# Patient Record
Sex: Female | Born: 1975 | Race: Black or African American | Hispanic: No | Smoking: Former smoker
Health system: Southern US, Community
[De-identification: ages and names within clinical notes are randomized; demographics above are authoritative.]

## PROBLEM LIST (undated history)

## (undated) ENCOUNTER — Emergency Department (HOSPITAL_COMMUNITY): Admission: EM | Payer: BLUE CROSS/BLUE SHIELD | Source: Home / Self Care

## (undated) DIAGNOSIS — I82409 Acute embolism and thrombosis of unspecified deep veins of unspecified lower extremity: Secondary | ICD-10-CM

## (undated) DIAGNOSIS — J329 Chronic sinusitis, unspecified: Secondary | ICD-10-CM

## (undated) DIAGNOSIS — R112 Nausea with vomiting, unspecified: Secondary | ICD-10-CM

## (undated) DIAGNOSIS — Z9889 Other specified postprocedural states: Secondary | ICD-10-CM

## (undated) DIAGNOSIS — Z95828 Presence of other vascular implants and grafts: Secondary | ICD-10-CM

## (undated) DIAGNOSIS — J45909 Unspecified asthma, uncomplicated: Secondary | ICD-10-CM

## (undated) DIAGNOSIS — J039 Acute tonsillitis, unspecified: Secondary | ICD-10-CM

## (undated) DIAGNOSIS — D219 Benign neoplasm of connective and other soft tissue, unspecified: Secondary | ICD-10-CM

## (undated) HISTORY — PX: BREAST SURGERY: SHX581

## (undated) HISTORY — PX: TUBAL LIGATION: SHX77

## (undated) HISTORY — PX: ABDOMINAL HYSTERECTOMY: SHX81

---

## 1997-08-04 ENCOUNTER — Emergency Department (HOSPITAL_COMMUNITY): Admission: EM | Admit: 1997-08-04 | Discharge: 1997-08-04 | Payer: Self-pay | Admitting: Internal Medicine

## 1997-11-23 ENCOUNTER — Inpatient Hospital Stay (HOSPITAL_COMMUNITY): Admission: AD | Admit: 1997-11-23 | Discharge: 1997-11-23 | Payer: Self-pay | Admitting: *Deleted

## 1997-11-29 ENCOUNTER — Ambulatory Visit (HOSPITAL_COMMUNITY): Admission: RE | Admit: 1997-11-29 | Discharge: 1997-11-29 | Payer: Self-pay | Admitting: Obstetrics

## 1997-12-22 ENCOUNTER — Ambulatory Visit (HOSPITAL_COMMUNITY): Admission: RE | Admit: 1997-12-22 | Discharge: 1997-12-22 | Payer: Self-pay | Admitting: *Deleted

## 1998-02-04 ENCOUNTER — Inpatient Hospital Stay (HOSPITAL_COMMUNITY): Admission: AD | Admit: 1998-02-04 | Discharge: 1998-02-04 | Payer: Self-pay | Admitting: Obstetrics & Gynecology

## 1998-03-23 ENCOUNTER — Encounter: Payer: Self-pay | Admitting: Emergency Medicine

## 1998-03-23 ENCOUNTER — Emergency Department (HOSPITAL_COMMUNITY): Admission: EM | Admit: 1998-03-23 | Discharge: 1998-03-23 | Payer: Self-pay | Admitting: Emergency Medicine

## 1998-03-30 ENCOUNTER — Other Ambulatory Visit: Admission: RE | Admit: 1998-03-30 | Discharge: 1998-03-30 | Payer: Self-pay | Admitting: Obstetrics

## 1998-06-07 ENCOUNTER — Inpatient Hospital Stay (HOSPITAL_COMMUNITY): Admission: AD | Admit: 1998-06-07 | Discharge: 1998-06-07 | Payer: Self-pay | Admitting: *Deleted

## 1998-06-07 ENCOUNTER — Observation Stay (HOSPITAL_COMMUNITY): Admission: AD | Admit: 1998-06-07 | Discharge: 1998-06-07 | Payer: Self-pay | Admitting: Obstetrics & Gynecology

## 1998-06-14 ENCOUNTER — Encounter (HOSPITAL_COMMUNITY): Admission: RE | Admit: 1998-06-14 | Discharge: 1998-06-18 | Payer: Self-pay | Admitting: Obstetrics & Gynecology

## 1998-06-15 ENCOUNTER — Inpatient Hospital Stay (HOSPITAL_COMMUNITY): Admission: AD | Admit: 1998-06-15 | Discharge: 1998-06-17 | Payer: Self-pay | Admitting: Obstetrics & Gynecology

## 1999-01-24 ENCOUNTER — Ambulatory Visit (HOSPITAL_COMMUNITY): Admission: RE | Admit: 1999-01-24 | Discharge: 1999-01-24 | Payer: Self-pay | Admitting: General Surgery

## 1999-02-25 ENCOUNTER — Other Ambulatory Visit: Admission: RE | Admit: 1999-02-25 | Discharge: 1999-02-25 | Payer: Self-pay | Admitting: Obstetrics

## 1999-02-25 ENCOUNTER — Encounter (INDEPENDENT_AMBULATORY_CARE_PROVIDER_SITE_OTHER): Payer: Self-pay

## 2000-03-27 ENCOUNTER — Other Ambulatory Visit: Admission: RE | Admit: 2000-03-27 | Discharge: 2000-03-27 | Payer: Self-pay | Admitting: Obstetrics

## 2001-07-23 ENCOUNTER — Emergency Department (HOSPITAL_COMMUNITY): Admission: EM | Admit: 2001-07-23 | Discharge: 2001-07-24 | Payer: Self-pay | Admitting: Emergency Medicine

## 2001-07-24 ENCOUNTER — Encounter: Payer: Self-pay | Admitting: Emergency Medicine

## 2004-03-29 ENCOUNTER — Inpatient Hospital Stay (HOSPITAL_COMMUNITY): Admission: AD | Admit: 2004-03-29 | Discharge: 2004-03-30 | Payer: Self-pay | Admitting: Obstetrics & Gynecology

## 2004-03-30 ENCOUNTER — Inpatient Hospital Stay (HOSPITAL_COMMUNITY): Admission: AD | Admit: 2004-03-30 | Discharge: 2004-04-02 | Payer: Self-pay | Admitting: Obstetrics and Gynecology

## 2004-03-30 ENCOUNTER — Encounter (INDEPENDENT_AMBULATORY_CARE_PROVIDER_SITE_OTHER): Payer: Self-pay | Admitting: *Deleted

## 2004-03-30 ENCOUNTER — Ambulatory Visit: Payer: Self-pay | Admitting: Obstetrics and Gynecology

## 2005-01-24 ENCOUNTER — Emergency Department (HOSPITAL_COMMUNITY): Admission: EM | Admit: 2005-01-24 | Discharge: 2005-01-24 | Payer: Self-pay | Admitting: Family Medicine

## 2005-04-06 ENCOUNTER — Inpatient Hospital Stay (HOSPITAL_COMMUNITY): Admission: AD | Admit: 2005-04-06 | Discharge: 2005-04-06 | Payer: Self-pay | Admitting: Obstetrics

## 2005-11-05 ENCOUNTER — Inpatient Hospital Stay (HOSPITAL_COMMUNITY): Admission: AD | Admit: 2005-11-05 | Discharge: 2005-11-05 | Payer: Self-pay | Admitting: Obstetrics and Gynecology

## 2007-01-18 ENCOUNTER — Inpatient Hospital Stay (HOSPITAL_COMMUNITY): Admission: AD | Admit: 2007-01-18 | Discharge: 2007-01-18 | Payer: Self-pay | Admitting: Obstetrics

## 2007-02-04 ENCOUNTER — Ambulatory Visit: Payer: Self-pay | Admitting: Internal Medicine

## 2007-02-04 DIAGNOSIS — J45909 Unspecified asthma, uncomplicated: Secondary | ICD-10-CM | POA: Insufficient documentation

## 2007-02-04 DIAGNOSIS — K219 Gastro-esophageal reflux disease without esophagitis: Secondary | ICD-10-CM | POA: Insufficient documentation

## 2007-02-16 ENCOUNTER — Ambulatory Visit (HOSPITAL_COMMUNITY): Admission: RE | Admit: 2007-02-16 | Discharge: 2007-02-16 | Payer: Self-pay | Admitting: Obstetrics & Gynecology

## 2007-03-14 ENCOUNTER — Inpatient Hospital Stay (HOSPITAL_COMMUNITY): Admission: AD | Admit: 2007-03-14 | Discharge: 2007-03-14 | Payer: Self-pay | Admitting: Obstetrics

## 2007-03-16 ENCOUNTER — Inpatient Hospital Stay (HOSPITAL_COMMUNITY): Admission: AD | Admit: 2007-03-16 | Discharge: 2007-03-17 | Payer: Self-pay | Admitting: Obstetrics

## 2007-03-25 ENCOUNTER — Inpatient Hospital Stay (HOSPITAL_COMMUNITY): Admission: RE | Admit: 2007-03-25 | Discharge: 2007-03-28 | Payer: Self-pay | Admitting: Obstetrics & Gynecology

## 2007-09-16 ENCOUNTER — Ambulatory Visit (HOSPITAL_COMMUNITY): Admission: RE | Admit: 2007-09-16 | Discharge: 2007-09-16 | Payer: Self-pay | Admitting: Obstetrics & Gynecology

## 2009-02-19 ENCOUNTER — Emergency Department (HOSPITAL_COMMUNITY): Admission: EM | Admit: 2009-02-19 | Discharge: 2009-02-19 | Payer: Self-pay | Admitting: Emergency Medicine

## 2010-04-28 ENCOUNTER — Emergency Department (HOSPITAL_COMMUNITY): Payer: Medicaid Other

## 2010-04-28 ENCOUNTER — Emergency Department (HOSPITAL_COMMUNITY)
Admission: EM | Admit: 2010-04-28 | Discharge: 2010-04-28 | Disposition: A | Discharge status: Home / Self Care | Payer: Medicaid Other | Attending: Emergency Medicine | Admitting: Emergency Medicine

## 2010-04-28 DIAGNOSIS — X500XXA Overexertion from strenuous movement or load, initial encounter: Secondary | ICD-10-CM | POA: Insufficient documentation

## 2010-04-28 DIAGNOSIS — M25476 Effusion, unspecified foot: Secondary | ICD-10-CM | POA: Insufficient documentation

## 2010-04-28 DIAGNOSIS — M25473 Effusion, unspecified ankle: Secondary | ICD-10-CM | POA: Insufficient documentation

## 2010-04-28 DIAGNOSIS — M25579 Pain in unspecified ankle and joints of unspecified foot: Secondary | ICD-10-CM | POA: Insufficient documentation

## 2010-04-28 DIAGNOSIS — Y9229 Other specified public building as the place of occurrence of the external cause: Secondary | ICD-10-CM | POA: Insufficient documentation

## 2010-04-28 DIAGNOSIS — S93409A Sprain of unspecified ligament of unspecified ankle, initial encounter: Secondary | ICD-10-CM | POA: Insufficient documentation

## 2010-04-28 DIAGNOSIS — J45909 Unspecified asthma, uncomplicated: Secondary | ICD-10-CM | POA: Insufficient documentation

## 2010-05-28 NOTE — H&P (Signed)
NAMESHAREE, Linda Dalton            ACCOUNT NO.:  [id number]   MEDICAL RECORD NO.:  [id number]          PATIENT TYPE:  INP   LOCATION:  9168                          [facility]ILITY:  [facility]   PHYSICIAN:  Roseanna Rainbow, M.D.DATE OF BIRTH:  1975-06-20   DATE OF ADMISSION:  03/16/2007  DATE OF DISCHARGE:                              HISTORY & PHYSICAL   CHIEF COMPLAINT:  The patient is a 35 year old para 3 with an estimated  date of confinement of March 11 with an intrauterine pregnancy at 30-6/7  weeks for induction of labor.   HISTORY OF PRESENT ILLNESS:  Please see the above.   ALLERGIES:  BIAXIN and CODEINE.   MEDICATIONS:  Valtrex and prenatal vitamins.   OB RISK FACTORS:  History of LGA, history of a preterm delivery, and  history of genital herpes.   PRENATAL LABORATORY DATA:  Chlamydia probe negative.  Urine culture and  sensitivity insignificant growth.  Pap smear negative. GC probe  negative.  One-hour GTT 87.  Hepatitis B surface antigen negative.  Hematocrit 35.6, hemoglobin 11.9. HIV nonreactive. HSV-1 and 2 titers  7.26 and 5.17, respectively. Platelets 225,000.  Blood type is AB  positive.  Antibody screen negative, RPR nonreactive.  Rubella is  immune.  Sickle cell negative.  Varicella immune.  Ultrasound performed  on January 23 at 33 weeks 2 days:  Amniotic fluid high-normal is 20.7,  no previa estimated. Fetal weight percentile was the 51st percentile.  GBS is negative on February 9.   PAST OBSTETRICAL HISTORY:  In May 1994, she was delivered of a live born  female full term, 7 pounds 6 ounces, vaginal delivery. In March 1998, she  was delivered of a live born female 7 pounds 14 ounces, full term vaginal  delivery. In June 2000, she was delivered of a live born female 9 pounds 2  ounces at post dates, vaginal delivery. In March 2006, she had PPROM at  19 weeks and a spontaneous abortion.   PAST MEDICAL HISTORY:  Asthma.   PAST SURGICAL HISTORY:  Lumpectomy for a  benign breast mass.   SOCIAL HISTORY:  Unemployed, single. Does not give any significant  history of alcohol usage.  Denies illicit drug use.   FAMILY HISTORY:  Asthma, heart disease, hypertension.   PHYSICAL EXAMINATION:  VITAL SIGNS:  Stable, afebrile.  Fetal heart  tracing reassuring.  PELVIC:  On sterile vaginal exam, the cervix is fingertip and thick.   ASSESSMENT:  1. Multipara at term for elective induction of labor.  2. Unfavorable Bishop score.  3. Fetal heart tracing consistent with fetal well being.   PLAN:  Admission. Two-stage induction of labor. Will start with ripening  with Cervidil.      Roseanna Rainbow, M.D.  Electronically Signed     LAJ/MEDQ  D:  03/17/2007  T:  03/17/2007  Job:  [id number]

## 2010-05-28 NOTE — Op Note (Signed)
Linda Dalton, Linda Dalton            ACCOUNT NO.:  [id number]   MEDICAL RECORD NO.:  [id number]          PATIENT TYPE:  AMB   LOCATION:  SDC                           [facility]   PHYSICIAN:  Roseanna Rainbow, M.D.DATE OF BIRTH:  1975-03-29   DATE OF PROCEDURE:  09/16/2007  DATE OF DISCHARGE:                               OPERATIVE REPORT   PREOPERATIVE DIAGNOSIS:  Multiparity, desires a sterilization procedure.   POSTOPERATIVE DIAGNOSIS:  Multiparity, desires a sterilization  procedure.   PROCEDURE:  Essure procedure.   SURGEON:  Roseanna Rainbow, MD   ANESTHESIA:  Managed anesthesia care, paracervical block.   ESTIMATED BLOOD LOSS:  Minimal.   COMPLICATIONS:  None.   PROCEDURE:  The patient was taken to the operating room with an IV  running.  She was placed in the dorsal lithotomy position and prepped  and draped in the usual sterile fashion.  After a time-out had been  completed, a sterile speculum was placed in the patient's vagina.  The  anterior lip of the cervix was then infiltrated with 2 mL of 1%  lidocaine.  The single-tooth tenaculum was then applied to this  location.  A 4 mL of 1% lidocaine were then injected at 4 and 7 o'clock  to produce a paracervical block.  The cervix was then dilated with [facility]  dilators.  The tubal ostia were visualized.  Of note, it was felt that  the right ostium would be more difficult to cannulate and the decision  was made to proceed with the side first.  There was significant amount  of tubal spasm bilaterally.  The Essure was then placed on the right.  The device was placed distally and it was difficult to visualize the  coils on the right.  The procedure was repeated on the left side.  The  coils were noted at the external os.  The coils were flushed with the  external os.  All instruments were then removed.  The single-tooth  tenaculum was removed with minimal bleeding noted from the cervix.  At  the close of the  procedure, the instrument and pack counts were said to  be correct x2.  The patient was taken to the PACU awake and in stable  condition.      Roseanna Rainbow, M.D.  Electronically Signed     LAJ/MEDQ  D:  09/16/2007  T:  09/17/2007  Job:  [id number]

## 2010-05-31 NOTE — Discharge Summary (Signed)
Linda Dalton, Linda Dalton    ACCOUNT NO.:  [id number]   MEDICAL RECORD NO.:  [id number]          PATIENT TYPE:  INP   LOCATION:  9310                          [facility]   PHYSICIAN:  Linda Dalton, M.D.DATE OF BIRTH:  [birth year]   DATE OF ADMISSION:  03/30/2004  DATE OF DISCHARGE:                                 DISCHARGE SUMMARY   ADMISSION DIAGNOSIS:  A 35 year old at 47 weeks with preterm premature  rupture of membranes.   DISCHARGE DIAGNOSIS:  Status post delivery of induced abortion at 17 weeks.   DISCHARGE MEDICATIONS:  1.  Ibuprofen 600 mg one tablet p.o. q.8h. p.r.n. pain #20, refills none.  2.  Depo-Provera 150 mg IM x1 given at [facility] before discharge.   HISTORY OF PRESENT ILLNESS:  The patient is a 35 year old G4 P3-0-1-3. She  was admitted at 17 weeks with PPROM and a low AFI. She had had prenatal care  in [place] but was in [place] due to the death of her mother. The  patient was initially admitted to [facility] but the patient was temporarily  discharged so she could attend the mother's funeral. She had received one  dose of Cytotec before leaving for the funeral. She returned after the  funeral and received her second dose of Cytotec. From that point on, the  plan was for Cytotec q.12h. until delivery with Unasyn 3 g IV q.6h. This  admission was completed by Dr. Vincente Dalton.   Transfer of care was completed on April 01, 2004 as the patient was  transferred to the teaching service with Dr. Gavin Dalton attending. The patient  was dosed with Cytotec 600 mg per vagina q.4h. The patient delivered at 2015  on April 01, 2004 of a nonviable fetus with Apgars of 0 at one minute and 0  at five minutes. Placenta delivered at 2225 on the same day. The patient had  adequate hemostasis after delivery. While being induced, the patient did not  have any fevers.   On discharge April 02, 2004 the patient was afebrile with stable vital  signs. She was coping well  with the loss. She had a firm fundus and  decreased bleeding. Her pain was controlled. She received Depo-Provera  before discharge.   [facility] [place] was contacted. The patient has an  appointment on April 05, 2004 at 1 p.m.   DISCHARGE INSTRUCTIONS:  Per routine for nonviable fetus.      GSD/MEDQ  D:  04/02/2004  T:  04/02/2004  Job:  [id number]   cc:   Linda Dalton, M.D.  [address], [address]  [place] [address]  Fax: [phone]   Linda Dalton, M.D.  [facility]  [address] [place], [place] [address]

## 2010-10-07 LAB — CBC
HCT: 34.6 — ABNORMAL LOW
HCT: 34.8 — ABNORMAL LOW
Hemoglobin: 11.8 — ABNORMAL LOW
Hemoglobin: 11.9 — ABNORMAL LOW
Hemoglobin: 12.1
MCHC: 34.6
MCV: 89.2
RBC: 3.82 — ABNORMAL LOW
RBC: 3.87
RBC: 3.9
RDW: 14.2
RDW: 14.3
WBC: 12.9 — ABNORMAL HIGH

## 2010-10-07 LAB — RAPID URINE DRUG SCREEN, HOSP PERFORMED

## 2010-10-07 LAB — RPR: RPR Ser Ql: NONREACTIVE

## 2010-10-16 LAB — CBC
HCT: 44.7
Hemoglobin: 14.9
MCHC: 33.4
MCV: 92.1
RBC: 4.85
WBC: 4.1

## 2012-03-21 ENCOUNTER — Emergency Department (INDEPENDENT_AMBULATORY_CARE_PROVIDER_SITE_OTHER)
Admission: EM | Admit: 2012-03-21 | Discharge: 2012-03-21 | Disposition: A | Discharge status: Home / Self Care | Payer: No Typology Code available for payment source | Source: Home / Self Care

## 2012-03-21 ENCOUNTER — Encounter (HOSPITAL_COMMUNITY): Payer: Self-pay | Admitting: Emergency Medicine

## 2012-03-21 DIAGNOSIS — J329 Chronic sinusitis, unspecified: Secondary | ICD-10-CM

## 2012-03-21 DIAGNOSIS — A084 Viral intestinal infection, unspecified: Secondary | ICD-10-CM

## 2012-03-21 DIAGNOSIS — A088 Other specified intestinal infections: Secondary | ICD-10-CM

## 2012-03-21 HISTORY — DX: Unspecified asthma, uncomplicated: J45.909

## 2012-03-21 MED ORDER — OXYMETAZOLINE HCL 0.05 % NA SOLN: 2.0000 | Freq: Two times a day (BID) | NASAL | Status: DC

## 2012-03-21 MED ORDER — FLUTICASONE PROPIONATE 50 MCG/ACT NA SUSP: 2.0000 | Freq: Every day | NASAL | Status: DC

## 2012-03-21 MED ORDER — MECLIZINE HCL 12.5 MG PO TABS: 12.5000 mg | ORAL_TABLET | Freq: Three times a day (TID) | ORAL | Status: DC | PRN

## 2012-03-21 MED ORDER — ONDANSETRON HCL 4 MG PO TABS: 4.0000 mg | ORAL_TABLET | Freq: Three times a day (TID) | ORAL | Status: DC | PRN

## 2012-03-21 NOTE — ED Notes (Signed)
Patient reports dizziness and headache and vomiting, onset this am.  Reports feeling fine yesterday

## 2012-03-21 NOTE — ED Provider Notes (Addendum)
History     CSN: [id number]  Arrival date & time 03/21/12  1322   First MD Initiated Contact with Patient 03/21/12 1344      Chief Complaint  Patient presents with  . Emesis    (Consider location/radiation/quality/duration/timing/severity/associated sxs/prior treatment) HPI This with a complaint of vomiting and dizziness. The patient woke up at 2:00 in the morning with nausea and when she stood up to go to the bathroom she noted she was quite dizzy. She vomited once and has since been taking only ice chips and has not vomited again. However she continues to be quite dizzy. She admits to having sinus pressure and popping in her ears since yesterday. She has been urinating well and has not noticed a decreased urine output or dark urine. She states she had rods put in her fallopian tubes to "close them up". She states it is not an IUD and she doubts that she is pregnant. Past Medical History  Diagnosis Date  . Asthma     Past Surgical History  Procedure Laterality Date  . Breast surgery      No family history on file.  History  Substance Use Topics  . Smoking status: Current Every Day Smoker  . Smokeless tobacco: Not on file  . Alcohol Use: No    OB History   Grav Para Term Preterm Abortions TAB SAB Ect Mult Living                  Review of Systems  Constitutional: Positive for appetite change and fatigue. Negative for fever, chills, diaphoresis and unexpected weight change.  HENT: Positive for rhinorrhea, postnasal drip and sinus pressure. Negative for ear pain and sneezing.   Gastrointestinal: Positive for nausea and vomiting. Negative for abdominal pain, diarrhea and constipation.  Genitourinary: Negative.   Musculoskeletal: Negative.   Skin: Negative.   Neurological: Negative.   Psychiatric/Behavioral: Negative.     Allergies  Codeine  Home Medications   Current Outpatient Rx  Name  Route  Sig  Dispense  Refill  . dimenhyDRINATE (DRAMAMINE) 50 MG tablet  Oral   Take 50 mg by mouth every 8 (eight) hours as needed.         . fluticasone (FLONASE) 50 MCG/ACT nasal spray   Nasal   Place 2 sprays into the nose daily.   16 g   2   . meclizine (ANTIVERT) 12.5 MG tablet   Oral   Take 1-2 tablets (12.5-25 mg total) by mouth 3 (three) times daily as needed for dizziness.   30 tablet   0   . ondansetron (ZOFRAN) 4 MG tablet   Oral   Take 1 tablet (4 mg total) by mouth every 8 (eight) hours as needed for nausea.   20 tablet   0   . oxymetazoline (AFRIN NASAL SPRAY) 0.05 % nasal spray   Nasal   Place 2 sprays into the nose 2 (two) times daily.   30 mL   0     BP 126/81  Pulse 90  Temp(Src) 99 F (37.2 C) (Oral)  SpO2 99%  LMP 01/28/2012  Physical Exam  ED Course  Procedures (including critical care time)  Labs Reviewed - No data to display No results found.   1. Sinus infection   2. Viral gastroenteritis       MDM  Likely sinus blockage is resulting in her symptoms of dizziness. Flonase, Afrin, meclizine prescribed. In addition Zofran prescribed for possible viral gastroenteritis. He is advised  to drink plenty of liquids to stay hydrated and transition from laying to standing position slowly.       Calvert Cantor, MD 03/21/12 1610  Calvert Cantor, MD 03/21/12 1622

## 2012-07-22 ENCOUNTER — Emergency Department (HOSPITAL_COMMUNITY)
Admission: EM | Admit: 2012-07-22 | Discharge: 2012-07-22 | Disposition: A | Discharge status: Home / Self Care | Payer: No Typology Code available for payment source | Attending: Emergency Medicine | Admitting: Emergency Medicine

## 2012-07-22 ENCOUNTER — Encounter (HOSPITAL_COMMUNITY): Payer: Self-pay

## 2012-07-22 DIAGNOSIS — J45909 Unspecified asthma, uncomplicated: Secondary | ICD-10-CM | POA: Insufficient documentation

## 2012-07-22 DIAGNOSIS — M549 Dorsalgia, unspecified: Secondary | ICD-10-CM | POA: Insufficient documentation

## 2012-07-22 DIAGNOSIS — R52 Pain, unspecified: Secondary | ICD-10-CM | POA: Insufficient documentation

## 2012-07-22 DIAGNOSIS — N760 Acute vaginitis: Secondary | ICD-10-CM | POA: Insufficient documentation

## 2012-07-22 DIAGNOSIS — R509 Fever, unspecified: Secondary | ICD-10-CM | POA: Insufficient documentation

## 2012-07-22 DIAGNOSIS — R5381 Other malaise: Secondary | ICD-10-CM | POA: Insufficient documentation

## 2012-07-22 DIAGNOSIS — B9689 Other specified bacterial agents as the cause of diseases classified elsewhere: Secondary | ICD-10-CM | POA: Insufficient documentation

## 2012-07-22 DIAGNOSIS — R32 Unspecified urinary incontinence: Secondary | ICD-10-CM | POA: Insufficient documentation

## 2012-07-22 DIAGNOSIS — N949 Unspecified condition associated with female genital organs and menstrual cycle: Secondary | ICD-10-CM | POA: Insufficient documentation

## 2012-07-22 DIAGNOSIS — R112 Nausea with vomiting, unspecified: Secondary | ICD-10-CM | POA: Insufficient documentation

## 2012-07-22 DIAGNOSIS — R5383 Other fatigue: Secondary | ICD-10-CM | POA: Insufficient documentation

## 2012-07-22 DIAGNOSIS — F17200 Nicotine dependence, unspecified, uncomplicated: Secondary | ICD-10-CM | POA: Insufficient documentation

## 2012-07-22 DIAGNOSIS — A499 Bacterial infection, unspecified: Secondary | ICD-10-CM | POA: Insufficient documentation

## 2012-07-22 DIAGNOSIS — N12 Tubulo-interstitial nephritis, not specified as acute or chronic: Secondary | ICD-10-CM

## 2012-07-22 DIAGNOSIS — R42 Dizziness and giddiness: Secondary | ICD-10-CM | POA: Insufficient documentation

## 2012-07-22 DIAGNOSIS — Z8742 Personal history of other diseases of the female genital tract: Secondary | ICD-10-CM | POA: Insufficient documentation

## 2012-07-22 HISTORY — DX: Benign neoplasm of connective and other soft tissue, unspecified: D21.9

## 2012-07-22 LAB — URINE MICROSCOPIC-ADD ON

## 2012-07-22 LAB — COMPREHENSIVE METABOLIC PANEL
AST: 29 U/L (ref 0–37)
BUN: 12 mg/dL (ref 6–23)
CO2: 24 mEq/L (ref 19–32)
Calcium: 9.4 mg/dL (ref 8.4–10.5)
Chloride: 98 mEq/L (ref 96–112)
Creatinine, Ser: 0.86 mg/dL (ref 0.50–1.10)
GFR calc non Af Amer: 87 mL/min — ABNORMAL LOW (ref >90)
Total Bilirubin: 1 mg/dL (ref 0.3–1.2)

## 2012-07-22 LAB — CBC WITH DIFFERENTIAL/PLATELET
Basophils Absolute: 0 10*3/uL (ref 0.0–0.1)
Basophils Relative: 0 % (ref 0–1)
Eosinophils Relative: 0 % (ref 0–5)
HCT: 33.3 % — ABNORMAL LOW (ref 36.0–46.0)
Hemoglobin: 10.6 g/dL — ABNORMAL LOW (ref 12.0–15.0)
Lymphocytes Relative: 11 % — ABNORMAL LOW (ref 12–46)
MCHC: 31.8 g/dL (ref 30.0–36.0)
MCV: 78.9 fL (ref 78.0–100.0)
Monocytes Absolute: 0.6 10*3/uL (ref 0.1–1.0)
Monocytes Relative: 6 % (ref 3–12)
Neutro Abs: 7.4 10*3/uL (ref 1.7–7.7)
RDW: 15.8 % — ABNORMAL HIGH (ref 11.5–15.5)

## 2012-07-22 LAB — WET PREP, GENITAL
Trich, Wet Prep: NONE SEEN
Yeast Wet Prep HPF POC: NONE SEEN

## 2012-07-22 LAB — URINALYSIS, ROUTINE W REFLEX MICROSCOPIC
Nitrite: NEGATIVE
Protein, ur: 100 mg/dL — AB
Specific Gravity, Urine: 1.025 (ref 1.005–1.030)
Urobilinogen, UA: 0.2 mg/dL (ref 0.0–1.0)

## 2012-07-22 LAB — PREGNANCY, URINE: Preg Test, Ur: NEGATIVE

## 2012-07-22 LAB — LIPASE, BLOOD: Lipase: 14 U/L (ref 11–59)

## 2012-07-22 MED ORDER — ONDANSETRON 8 MG PO TBDP: 8.0000 mg | ORAL_TABLET | Freq: Three times a day (TID) | ORAL | Status: DC | PRN

## 2012-07-22 MED ORDER — SODIUM CHLORIDE 0.9 % IV BOLUS (SEPSIS)
1000.0000 mL | Freq: Once | INTRAVENOUS | Status: AC
  Administered 2012-07-22: 1000 mL via INTRAVENOUS

## 2012-07-22 MED ORDER — ONDANSETRON HCL 4 MG/2ML IJ SOLN
4.0000 mg | Freq: Once | INTRAMUSCULAR | Status: AC
  Administered 2012-07-22: 4 mg via INTRAVENOUS
  Filled 2012-07-22: qty 2

## 2012-07-22 MED ORDER — MORPHINE SULFATE 4 MG/ML IJ SOLN
4.0000 mg | Freq: Once | INTRAMUSCULAR | Status: AC
  Administered 2012-07-22: 4 mg via INTRAVENOUS
  Filled 2012-07-22: qty 1

## 2012-07-22 MED ORDER — HYDROCODONE-ACETAMINOPHEN 5-325 MG PO TABS: 1.0000 | ORAL_TABLET | Freq: Four times a day (QID) | ORAL | Status: DC | PRN

## 2012-07-22 MED ORDER — CEPHALEXIN 500 MG PO CAPS: 500.0000 mg | ORAL_CAPSULE | Freq: Four times a day (QID) | ORAL | Status: DC

## 2012-07-22 MED ORDER — METRONIDAZOLE 500 MG PO TABS: 500.0000 mg | ORAL_TABLET | Freq: Two times a day (BID) | ORAL | Status: DC

## 2012-07-22 MED ORDER — ACETAMINOPHEN 325 MG PO TABS
650.0000 mg | ORAL_TABLET | Freq: Once | ORAL | Status: AC
  Administered 2012-07-22: 650 mg via ORAL
  Filled 2012-07-22: qty 2

## 2012-07-22 MED ORDER — SODIUM CHLORIDE 0.9 % IV BOLUS (SEPSIS): 1000.0000 mL | Freq: Once | INTRAVENOUS | Status: DC

## 2012-07-22 MED ORDER — DEXTROSE 5 % IV SOLN
1.0000 g | Freq: Once | INTRAVENOUS | Status: AC
  Administered 2012-07-22: 1 g via INTRAVENOUS
  Filled 2012-07-22: qty 10

## 2012-07-22 NOTE — ED Notes (Signed)
gingerale given

## 2012-07-22 NOTE — ED Provider Notes (Signed)
History    CSN: [id number] Arrival date & time 07/22/12  1214  First MD Initiated Contact with Patient 07/22/12 1235     No chief complaint on file.  (Consider location/radiation/quality/duration/timing/severity/associated sxs/prior Treatment) HPI KACI DILLIE is a 37 y.o. female who presents to ED with complaint of body aches, back pain, abdominal pain, chills onset yesterday. States symptoms began with back ache and chills last night. States took advil this morning with no improvement. States pain radiates from the back into the lower abdomen. Pt admits to dizziness, nausea, vomiting. Pt denies any dysuria, but reports two episodes of urinary incontinence today. Pt denies any vaginal bleeding or discharge. No headache, neck pain or stiffness, no cough or URI symptoms.    Past Medical History  Diagnosis Date  . Asthma   . Fibroid tumor    Past Surgical History  Procedure Laterality Date  . Breast surgery     History reviewed. No pertinent family history. History  Substance Use Topics  . Smoking status: Current Every Day Smoker  . Smokeless tobacco: Not on file  . Alcohol Use: No   OB History   Grav Para Term Preterm Abortions TAB SAB Ect Mult Living                 Review of Systems  Constitutional: Positive for fever, chills and fatigue.  HENT: Negative for ear pain, congestion, sore throat, mouth sores, neck pain and neck stiffness.   Respiratory: Negative.  Negative for cough and shortness of breath.   Cardiovascular: Negative.   Gastrointestinal: Positive for nausea, vomiting and abdominal pain. Negative for diarrhea, constipation and blood in stool.  Genitourinary: Positive for flank pain and pelvic pain. Negative for dysuria, urgency, frequency, hematuria, vaginal bleeding, vaginal discharge and vaginal pain.  Musculoskeletal: Positive for back pain.  Skin: Negative.   Neurological: Positive for dizziness, weakness and light-headedness. Negative for  headaches.    Allergies  Codeine  Home Medications   Current Outpatient Rx  Name  Route  Sig  Dispense  Refill  . ondansetron (ZOFRAN) 4 MG tablet   Oral   Take 1 tablet (4 mg total) by mouth every 8 (eight) hours as needed for nausea.   20 tablet   0    BP 128/72  Pulse 112  Temp(Src) 102.8 F (39.3 C) (Oral)  Resp 22  SpO2 98%  LMP 07/08/2012 Physical Exam  Nursing note and vitals reviewed. Constitutional: She is oriented to person, place, and time. She appears well-developed and well-nourished. No distress.  Eyes: Conjunctivae are normal.  Neck: Normal range of motion. Neck supple.  Cardiovascular: Normal rate, regular rhythm and normal heart sounds.   Pulmonary/Chest: Effort normal and breath sounds normal. No respiratory distress. She has no wheezes. She has no rales.  Abdominal: Soft. Bowel sounds are normal. She exhibits no distension. There is tenderness. There is no rebound and no guarding.  Suprapubic tenderness. Bilateral CVA tenderness  Genitourinary:  Normal external genitalia. Yellow thin vginal discharge. No CMT. Uterus enlarge, possible fibroid palpated. No adnexal tenderness  Musculoskeletal: She exhibits no edema.  Lymphadenopathy:    She has no cervical adenopathy.  Neurological: She is alert and oriented to person, place, and time.  Skin: Skin is warm and dry.    ED Course  Procedures (including critical care time)  Results for orders placed during the hospital encounter of 07/22/12  WET PREP, GENITAL      Result Value Range   Yeast Wet Prep  HPF POC NONE SEEN  NONE SEEN   Trich, Wet Prep NONE SEEN  NONE SEEN   Clue Cells Wet Prep HPF POC MODERATE (*) NONE SEEN   WBC, Wet Prep HPF POC FEW (*) NONE SEEN  CBC WITH DIFFERENTIAL      Result Value Range   WBC 9.0  4.0 - 10.5 K/uL   RBC 4.22  3.87 - 5.11 MIL/uL   Hemoglobin 10.6 (*) 12.0 - 15.0 g/dL   HCT 30.8 (*) 65.7 - 84.6 %   MCV 78.9  78.0 - 100.0 fL   MCH 25.1 (*) 26.0 - 34.0 pg   MCHC  31.8  30.0 - 36.0 g/dL   RDW 96.2 (*) 95.2 - 84.1 %   Platelets 309  150 - 400 K/uL   Neutrophils Relative % 83 (*) 43 - 77 %   Neutro Abs 7.4  1.7 - 7.7 K/uL   Lymphocytes Relative 11 (*) 12 - 46 %   Lymphs Abs 1.0  0.7 - 4.0 K/uL   Monocytes Relative 6  3 - 12 %   Monocytes Absolute 0.6  0.1 - 1.0 K/uL   Eosinophils Relative 0  0 - 5 %   Eosinophils Absolute 0.0  0.0 - 0.7 K/uL   Basophils Relative 0  0 - 1 %   Basophils Absolute 0.0  0.0 - 0.1 K/uL  COMPREHENSIVE METABOLIC PANEL      Result Value Range   Sodium 133 (*) 135 - 145 mEq/L   Potassium 3.8  3.5 - 5.1 mEq/L   Chloride 98  96 - 112 mEq/L   CO2 24  19 - 32 mEq/L   Glucose, Bld 105 (*) 70 - 99 mg/dL   BUN 12  6 - 23 mg/dL   Creatinine, Ser 3.24  0.50 - 1.10 mg/dL   Calcium 9.4  8.4 - 40.1 mg/dL   Total Protein 8.0  6.0 - 8.3 g/dL   Albumin 3.8  3.5 - 5.2 g/dL   AST 29  0 - 37 U/L   ALT 23  0 - 35 U/L   Alkaline Phosphatase 81  39 - 117 U/L   Total Bilirubin 1.0  0.3 - 1.2 mg/dL   GFR calc non Af Amer 87 (*) >90 mL/min   GFR calc Af Amer >90  >90 mL/min  URINALYSIS, ROUTINE W REFLEX MICROSCOPIC      Result Value Range   Color, Urine YELLOW  YELLOW   APPearance CLOUDY (*) CLEAR   Specific Gravity, Urine 1.025  1.005 - 1.030   pH 5.5  5.0 - 8.0   Glucose, UA NEGATIVE  NEGATIVE mg/dL   Hgb urine dipstick MODERATE (*) NEGATIVE   Bilirubin Urine NEGATIVE  NEGATIVE   Ketones, ur NEGATIVE  NEGATIVE mg/dL   Protein, ur 027 (*) NEGATIVE mg/dL   Urobilinogen, UA 0.2  0.0 - 1.0 mg/dL   Nitrite NEGATIVE  NEGATIVE   Leukocytes, UA LARGE (*) NEGATIVE  LIPASE, BLOOD      Result Value Range   Lipase 14  11 - 59 U/L  URINE MICROSCOPIC-ADD ON      Result Value Range   Squamous Epithelial / LPF FEW (*) RARE   WBC, UA TOO NUMEROUS TO COUNT  <3 WBC/hpf   RBC / HPF 0-2  <3 RBC/hpf   Bacteria, UA MANY (*) RARE    No results found.  1. Pyelonephritis   2. BV (bacterial vaginosis)     MDM  Pt with bilateral lower back  pain, abdominal pain, fever. UA infected. Pelvic exam unremarkable. Pt rehyadred. Tylenol given for fever. Rocephin for UTI. Question most likely pyelonephritis given fever, vomiting, CVA tenderness. No acute abdomen. Pt feeling better and tolerating PO fluids in ED. Will start on keflex for infection. Flagyl for BV. Pt comfortable going home. Provided pain and nausea medications.   Filed Vitals:   07/22/12 1515 07/22/12 1531 07/22/12 1600 07/22/12 1606  BP: 116/65 109/82 122/65   Pulse: 90 93 94   Temp:  100.7 F (38.2 C)  99 F (37.2 C)  TempSrc:  Oral  Oral  Resp:      SpO2: 97% 99% 99%      Lottie Mussel, PA-C 07/22/12 1921

## 2012-07-22 NOTE — ED Notes (Addendum)
Pt presents with onset of cold chills and shortness of breath that began last night.  Pt reports nausea and vomiting that began this morning, with bilateral lower abdominal pain.  Pt denies any vaginal discharge or dysuria; reports episode of urinary incontinence that occurred today. Pt reports h/o fibroid cysts with this pain much worse.  Pt unsure of sick contact.

## 2012-07-23 LAB — GC/CHLAMYDIA PROBE AMP
CT Probe RNA: NEGATIVE
GC Probe RNA: NEGATIVE

## 2012-07-24 LAB — URINE CULTURE

## 2012-07-24 NOTE — ED Provider Notes (Signed)
Medical screening examination/treatment/procedure(s) were performed by non-physician practitioner and as supervising physician I was immediately available for consultation/collaboration.   Hurman Horn, MD 07/24/12 2004

## 2012-07-25 ENCOUNTER — Telehealth (HOSPITAL_COMMUNITY): Payer: Self-pay | Admitting: Emergency Medicine

## 2012-07-25 NOTE — ED Notes (Signed)
Post ED Visit - Positive Culture Follow-up  Culture report reviewed by antimicrobial stewardship pharmacist: [x]  Wes Dulaney, Pharm.D., BCPS []  Celedonio Miyamoto, [address].D., BCPS []  Georgina Pillion, Pharm.D., BCPS []  [place], [address].D., BCPS, AAHIVP []  Estella Husk, Pharm.D., BCPS, AAHIVP  Positive urine culture Treated with Keflex, organism sensitive to the same and no further patient follow-up is required at this time.  Linda Dalton 07/25/2012, 5:46 PM

## 2012-08-11 ENCOUNTER — Emergency Department (HOSPITAL_COMMUNITY)
Admission: EM | Admit: 2012-08-11 | Discharge: 2012-08-11 | Disposition: A | Discharge status: Home / Self Care | Payer: Self-pay | Attending: Emergency Medicine | Admitting: Emergency Medicine

## 2012-08-11 ENCOUNTER — Encounter (HOSPITAL_COMMUNITY): Payer: Self-pay | Admitting: Emergency Medicine

## 2012-08-11 DIAGNOSIS — R1310 Dysphagia, unspecified: Secondary | ICD-10-CM | POA: Insufficient documentation

## 2012-08-11 DIAGNOSIS — J069 Acute upper respiratory infection, unspecified: Secondary | ICD-10-CM | POA: Insufficient documentation

## 2012-08-11 DIAGNOSIS — J029 Acute pharyngitis, unspecified: Secondary | ICD-10-CM | POA: Insufficient documentation

## 2012-08-11 DIAGNOSIS — Z8589 Personal history of malignant neoplasm of other organs and systems: Secondary | ICD-10-CM | POA: Insufficient documentation

## 2012-08-11 DIAGNOSIS — J45909 Unspecified asthma, uncomplicated: Secondary | ICD-10-CM | POA: Insufficient documentation

## 2012-08-11 DIAGNOSIS — H9209 Otalgia, unspecified ear: Secondary | ICD-10-CM | POA: Insufficient documentation

## 2012-08-11 DIAGNOSIS — IMO0002 Reserved for concepts with insufficient information to code with codable children: Secondary | ICD-10-CM | POA: Insufficient documentation

## 2012-08-11 DIAGNOSIS — R51 Headache: Secondary | ICD-10-CM | POA: Insufficient documentation

## 2012-08-11 DIAGNOSIS — F17200 Nicotine dependence, unspecified, uncomplicated: Secondary | ICD-10-CM | POA: Insufficient documentation

## 2012-08-11 DIAGNOSIS — Z79899 Other long term (current) drug therapy: Secondary | ICD-10-CM | POA: Insufficient documentation

## 2012-08-11 MED ORDER — GUAIFENESIN ER 600 MG PO TB12: 1200.0000 mg | ORAL_TABLET | Freq: Two times a day (BID) | ORAL | Status: DC

## 2012-08-11 MED ORDER — BENZOCAINE 20 % MT SOLN: 1.0000 "application " | Freq: Three times a day (TID) | OROMUCOSAL | Status: DC | PRN

## 2012-08-11 MED ORDER — OXYMETAZOLINE HCL 0.05 % NA SOLN
1.0000 | Freq: Once | NASAL | Status: AC
  Administered 2012-08-11: 1 via NASAL
  Filled 2012-08-11: qty 15

## 2012-08-11 MED ORDER — FLUTICASONE PROPIONATE 50 MCG/ACT NA SUSP: 2.0000 | Freq: Every day | NASAL | Status: DC

## 2012-08-11 NOTE — ED Notes (Signed)
Pt reports sore throat since yesterday that now radiates to both ears. States throat is extremely sore, reports difficulty swallowing. States both ears are now painful and feel like a "popping sensation" in them. Reports taking 2 tylenol around 7pm. Denies fever, nausea, and vomiting with throat and ear pain.

## 2012-08-11 NOTE — ED Notes (Signed)
PT. REPORTS SORE THROAT ONSET YESTERDAY , DENIES FEVER OR CHILLS , NO COUGH OR NASAL CONGESTION .

## 2012-08-11 NOTE — ED Provider Notes (Signed)
CSN: [id number]     Arrival date & time 08/11/12  2123 History  This chart was scribed for Dierdre Forth, PA-C, working with Loren Racer, MD, by Ardelia Mems ED Scribe. This patient was seen in room TR07C/TR07C and the patient's care was started at 10:43 PM.   First MD Initiated Contact with Patient 08/11/12 2223     Chief Complaint  Patient presents with  . Sore Throat    The history is provided by the patient. No language interpreter was used.   HPI Comments: Linda Dalton is a 37 y.o. Female with a history of asthma and fibroid tumor who presents to the Emergency Department complaining of a gradually worsening sore throat onset yesterday with associated "sharp", "stabbing" ear pain and a mild, intermittent headache. She states that her throat pain is worsened with swallowing.  She states that she has seasonal allergies and that she has not been taking Allegra recently. She denies sick contacts. She states that she has tried Tylenol and Motrin with some relief of her headache. She has not tried taking Mucinex. She denies cough, rhinorrhea, fever, chest pain, SOB, chills, congestion or any other symptoms.    Past Medical History  Diagnosis Date  . Asthma   . Fibroid tumor    Past Surgical History  Procedure Laterality Date  . Breast surgery    . Tubal ligation     No family history on file. History  Substance Use Topics  . Smoking status: Current Every Day Smoker  . Smokeless tobacco: Not on file  . Alcohol Use: No   OB History   Grav Para Term Preterm Abortions TAB SAB Ect Mult Living                 Review of Systems  Constitutional: Negative for fever, chills, diaphoresis, appetite change, fatigue and unexpected weight change.  HENT: Positive for ear pain, sore throat and trouble swallowing (pain with swallowing). Negative for congestion, mouth sores and neck stiffness.   Eyes: Negative for visual disturbance.  Respiratory: Negative for cough, chest  tightness, shortness of breath and wheezing.   Cardiovascular: Negative for chest pain.  Gastrointestinal: Negative for nausea, vomiting, abdominal pain, diarrhea and constipation.  Endocrine: Negative for polydipsia, polyphagia and polyuria.  Genitourinary: Negative for dysuria, urgency, frequency and hematuria.  Musculoskeletal: Negative for back pain.  Skin: Negative for rash.  Allergic/Immunologic: Negative for immunocompromised state.  Neurological: Positive for headaches. Negative for syncope and light-headedness.  Hematological: Does not bruise/bleed easily.  Psychiatric/Behavioral: Negative for sleep disturbance. The patient is not nervous/anxious.   All other systems reviewed and are negative.    Allergies  Biaxin and Codeine  Home Medications   Current Outpatient Rx  Name  Route  Sig  Dispense  Refill  . acetaminophen (TYLENOL) 500 MG tablet   Oral   Take 2,000 mg by mouth every 8 (eight) hours as needed for pain.         Marland Kitchen HYDROcodone-acetaminophen (NORCO) 5-325 MG per tablet   Oral   Take 1 tablet by mouth every 6 (six) hours as needed for pain.   20 tablet   0   . ibuprofen (ADVIL,MOTRIN) 200 MG tablet   Oral   Take 400 mg by mouth every 6 (six) hours as needed for pain.         . Multiple Vitamins-Minerals (MULTIVITAMIN WITH MINERALS) tablet   Oral   Take 1 tablet by mouth daily.         Marland Kitchen  ondansetron (ZOFRAN ODT) 8 MG disintegrating tablet   Oral   Take 1 tablet (8 mg total) by mouth every 8 (eight) hours as needed for nausea.   10 tablet   0   . Tetrahydrozoline HCl (VISINE OP)   Both Eyes   Place 1 drop into both eyes 2 (two) times daily as needed (dry eyes).         . benzocaine (HURRICAINE) 20 % SOLN   Mouth/Throat   Use as directed 1 application in the mouth or throat 3 (three) times daily as needed. As needed for sore throat   9.75 mL   0   . cephALEXin (KEFLEX) 500 MG capsule   Oral   Take 1 capsule (500 mg total) by mouth 4  (four) times daily.   40 capsule   0   . fluticasone (FLONASE) 50 MCG/ACT nasal spray   Nasal   Place 2 sprays into the nose daily.   16 g   0   . guaiFENesin (MUCINEX) 600 MG 12 hr tablet   Oral   Take 2 tablets (1,200 mg total) by mouth 2 (two) times daily.   30 tablet   0   . metroNIDAZOLE (FLAGYL) 500 MG tablet   Oral   Take 1 tablet (500 mg total) by mouth 2 (two) times daily.   14 tablet   0    Triage Vitals: BP 115/74  Pulse 84  Temp(Src) 98.8 F (37.1 C) (Oral)  Resp 14  SpO2 98%  LMP 07/08/2012  Physical Exam  Constitutional: She is oriented to person, place, and time. She appears well-developed and well-nourished. No distress.  HENT:  Head: Normocephalic and atraumatic.  Right Ear: Hearing, tympanic membrane, external ear and ear canal normal.  Left Ear: Hearing, tympanic membrane, external ear and ear canal normal.  Nose: Mucosal edema and rhinorrhea present. No epistaxis. Right sinus exhibits no maxillary sinus tenderness and no frontal sinus tenderness. Left sinus exhibits no maxillary sinus tenderness and no frontal sinus tenderness.  Mouth/Throat: Uvula is midline and mucous membranes are normal. Mucous membranes are not pale and not cyanotic. No edematous. Posterior oropharyngeal erythema present. No oropharyngeal exudate, posterior oropharyngeal edema or tonsillar abscesses.  Mucosal edema and rhinorrhea. In the oropharynx, she has mild erythema, no exudate, no edema, but she has open crypts bilaterally.  Eyes: Conjunctivae are normal. Pupils are equal, round, and reactive to light.  Neck: Normal range of motion and full passive range of motion without pain. No spinous process tenderness and no muscular tenderness present. No rigidity. Normal range of motion present.  Cardiovascular: Normal rate, regular rhythm, S1 normal, S2 normal, normal heart sounds and intact distal pulses.   No murmur heard. Pulses:      Radial pulses are 2+ on the right side, and  2+ on the left side.       Dorsalis pedis pulses are 2+ on the right side, and 2+ on the left side.       Posterior tibial pulses are 2+ on the right side, and 2+ on the left side.  Pulmonary/Chest: Effort normal and breath sounds normal. No accessory muscle usage or stridor. Not tachypneic. No respiratory distress. She has no decreased breath sounds. She has no wheezes. She has no rhonchi. She has no rales. She exhibits no tenderness.  Clear and Equal breath sounds throughout  Abdominal: Soft. Bowel sounds are normal. There is no tenderness. There is no rigidity.  Musculoskeletal: Normal range of motion. She exhibits  no tenderness.  Lymphadenopathy:       Head (right side): No submental, no submandibular, no tonsillar, no preauricular, no posterior auricular and no occipital adenopathy present.       Head (left side): No submental, no submandibular, no tonsillar, no preauricular, no posterior auricular and no occipital adenopathy present.    She has no cervical adenopathy.       Right cervical: No superficial cervical, no deep cervical and no posterior cervical adenopathy present.      Left cervical: No superficial cervical, no deep cervical and no posterior cervical adenopathy present.  Neurological: She is alert and oriented to person, place, and time. GCS eye subscore is 4. GCS verbal subscore is 5. GCS motor subscore is 6.  Skin: Skin is warm and dry. No rash noted. She is not diaphoretic. No erythema.  Psychiatric: She has a normal mood and affect.    ED Course   Procedures (including critical care time)  DIAGNOSTIC STUDIES: Oxygen Saturation is 98% on RA, normal by my interpretation.    COORDINATION OF CARE: 11:59 PM- Pt advised of plan for treatment and pt agrees.   Labs Reviewed  RAPID STREP SCREEN  CULTURE, GROUP A STREP   No results found.  1. Viral URI   2. Viral pharyngitis     MDM  Linda Dalton presents with URI without cough; no CXR indicated at this time.   Patients symptoms are consistent with URI, likely viral etiology. Pt afebrile without tonsillar exudate, negative strep. Presents with mild cervical lymphadenopathy, & dysphagia; pt also with viral pharyngitis. Pt does not appear dehydrated, but did discuss importance of water rehydration. Presentation non concerning for PTA or infxn spread to soft tissue. No trismus or uvula deviation. Specific return precautions discussed. Pt able to drink water in ED without difficulty with intact air way. Discussed that antibiotics are not indicated for viral infections. Pt will be discharged with symptomatic treatment.  Verbalizes understanding and is agreeable with plan. Pt is hemodynamically stable & in NAD prior to dc.  I have also discussed reasons to return immediately to the ER.  Patient expresses understanding and agrees with plan.  I personally performed the services described in this documentation, which was scribed in my presence. The recorded information has been reviewed and is accurate.    Dahlia Client Sharne Linders, PA-C 08/11/12 [phone]

## 2012-08-12 NOTE — ED Provider Notes (Signed)
Medical screening examination/treatment/procedure(s) were performed by non-physician practitioner and as supervising physician I was immediately available for consultation/collaboration.   Brannan Cassedy, MD 08/12/12 1755 

## 2012-08-13 LAB — CULTURE, GROUP A STREP

## 2013-05-01 ENCOUNTER — Inpatient Hospital Stay (HOSPITAL_COMMUNITY): Payer: Medicaid Other

## 2013-05-01 ENCOUNTER — Encounter (HOSPITAL_COMMUNITY): Payer: Self-pay | Admitting: Emergency Medicine

## 2013-05-01 ENCOUNTER — Inpatient Hospital Stay (HOSPITAL_COMMUNITY)
Admission: EM | Admit: 2013-05-01 | Discharge: 2013-05-04 | DRG: 253 | Disposition: A | Discharge status: Home / Self Care | Payer: Medicaid Other | Attending: Internal Medicine | Admitting: Internal Medicine

## 2013-05-01 DIAGNOSIS — D649 Anemia, unspecified: Secondary | ICD-10-CM | POA: Diagnosis present

## 2013-05-01 DIAGNOSIS — Z885 Allergy status to narcotic agent status: Secondary | ICD-10-CM

## 2013-05-01 DIAGNOSIS — I824Y9 Acute embolism and thrombosis of unspecified deep veins of unspecified proximal lower extremity: Principal | ICD-10-CM | POA: Diagnosis present

## 2013-05-01 DIAGNOSIS — J45909 Unspecified asthma, uncomplicated: Secondary | ICD-10-CM | POA: Diagnosis present

## 2013-05-01 DIAGNOSIS — M7989 Other specified soft tissue disorders: Secondary | ICD-10-CM

## 2013-05-01 DIAGNOSIS — Z7901 Long term (current) use of anticoagulants: Secondary | ICD-10-CM

## 2013-05-01 DIAGNOSIS — F17200 Nicotine dependence, unspecified, uncomplicated: Secondary | ICD-10-CM | POA: Diagnosis present

## 2013-05-01 DIAGNOSIS — M79609 Pain in unspecified limb: Secondary | ICD-10-CM

## 2013-05-01 DIAGNOSIS — Z888 Allergy status to other drugs, medicaments and biological substances status: Secondary | ICD-10-CM | POA: Diagnosis not present

## 2013-05-01 DIAGNOSIS — I82402 Acute embolism and thrombosis of unspecified deep veins of left lower extremity: Secondary | ICD-10-CM

## 2013-05-01 DIAGNOSIS — I871 Compression of vein: Secondary | ICD-10-CM | POA: Diagnosis present

## 2013-05-01 DIAGNOSIS — I82409 Acute embolism and thrombosis of unspecified deep veins of unspecified lower extremity: Secondary | ICD-10-CM | POA: Diagnosis present

## 2013-05-01 DIAGNOSIS — E876 Hypokalemia: Secondary | ICD-10-CM | POA: Diagnosis not present

## 2013-05-01 LAB — APTT: APTT: 32 s (ref 24–37)

## 2013-05-01 LAB — COMPREHENSIVE METABOLIC PANEL
ALBUMIN: 3 g/dL — AB (ref 3.5–5.2)
ALT: 45 U/L — ABNORMAL HIGH (ref 0–35)
AST: 25 U/L (ref 0–37)
Alkaline Phosphatase: 241 U/L — ABNORMAL HIGH (ref 39–117)
BUN: 12 mg/dL (ref 6–23)
CALCIUM: 9.3 mg/dL (ref 8.4–10.5)
CO2: 23 mEq/L (ref 19–32)
CREATININE: 0.6 mg/dL (ref 0.50–1.10)
Chloride: 100 mEq/L (ref 96–112)
GFR calc Af Amer: >90 mL/min (ref >90)
Glucose, Bld: 100 mg/dL — ABNORMAL HIGH (ref 70–99)
Potassium: 3.8 mEq/L (ref 3.7–5.3)
Sodium: 138 mEq/L (ref 137–147)
TOTAL PROTEIN: 8.2 g/dL (ref 6.0–8.3)
Total Bilirubin: 0.3 mg/dL (ref 0.3–1.2)

## 2013-05-01 LAB — CBC WITH DIFFERENTIAL/PLATELET
BASOS ABS: 0 10*3/uL (ref 0.0–0.1)
BASOS PCT: 0 % (ref 0–1)
EOS ABS: 0.2 10*3/uL (ref 0.0–0.7)
EOS PCT: 2 % (ref 0–5)
HEMATOCRIT: 31.3 % — AB (ref 36.0–46.0)
Hemoglobin: 10 g/dL — ABNORMAL LOW (ref 12.0–15.0)
Lymphocytes Relative: 31 % (ref 12–46)
Lymphs Abs: 2.1 10*3/uL (ref 0.7–4.0)
MCH: 26.7 pg (ref 26.0–34.0)
MCHC: 31.9 g/dL (ref 30.0–36.0)
MCV: 83.5 fL (ref 78.0–100.0)
MONO ABS: 0.7 10*3/uL (ref 0.1–1.0)
Monocytes Relative: 10 % (ref 3–12)
Neutro Abs: 3.8 10*3/uL (ref 1.7–7.7)
Neutrophils Relative %: 57 % (ref 43–77)
Platelets: 584 10*3/uL — ABNORMAL HIGH (ref 150–400)
RBC: 3.75 MIL/uL — ABNORMAL LOW (ref 3.87–5.11)
RDW: 14.9 % (ref 11.5–15.5)
WBC: 6.7 10*3/uL (ref 4.0–10.5)

## 2013-05-01 LAB — FIBRINOGEN: Fibrinogen: 468 mg/dL (ref 204–475)

## 2013-05-01 LAB — CBC
HCT: 29 % — ABNORMAL LOW (ref 36.0–46.0)
HCT: 27.2 % — ABNORMAL LOW (ref 36.0–46.0)
HEMOGLOBIN: 9.3 g/dL — AB (ref 12.0–15.0)
Hemoglobin: 8.8 g/dL — ABNORMAL LOW (ref 12.0–15.0)
MCH: 26.8 pg (ref 26.0–34.0)
MCH: 26.9 pg (ref 26.0–34.0)
MCHC: 32.1 g/dL (ref 30.0–36.0)
MCHC: 32.4 g/dL (ref 30.0–36.0)
MCV: 83.6 fL (ref 78.0–100.0)
MCV: 83.2 fL (ref 78.0–100.0)
Platelets: 395 10*3/uL (ref 150–400)
Platelets: 347 10*3/uL (ref 150–400)
RBC: 3.47 MIL/uL — ABNORMAL LOW (ref 3.87–5.11)
RBC: 3.27 MIL/uL — ABNORMAL LOW (ref 3.87–5.11)
RDW: 14.9 % (ref 11.5–15.5)
RDW: 14.8 % (ref 11.5–15.5)
WBC: 11.3 10*3/uL — ABNORMAL HIGH (ref 4.0–10.5)
WBC: 10.2 10*3/uL (ref 4.0–10.5)

## 2013-05-01 LAB — HEPARIN LEVEL (UNFRACTIONATED): Heparin Unfractionated: <0.1 IU/mL — ABNORMAL LOW (ref 0.30–0.70)

## 2013-05-01 LAB — PROTIME-INR
INR: 1 (ref 0.00–1.49)
Prothrombin Time: 13 seconds (ref 11.6–15.2)

## 2013-05-01 LAB — PRO B NATRIURETIC PEPTIDE: Pro B Natriuretic peptide (BNP): 6.2 pg/mL (ref 0–125)

## 2013-05-01 LAB — MRSA PCR SCREENING: MRSA by PCR: NEGATIVE

## 2013-05-01 MED ORDER — SODIUM CHLORIDE 0.9 % IJ SOLN
3.0000 mL | Freq: Two times a day (BID) | INTRAMUSCULAR | Status: DC
  Administered 2013-05-03 – 2013-05-04 (×3): 3 mL via INTRAVENOUS

## 2013-05-01 MED ORDER — ONDANSETRON HCL 4 MG/2ML IJ SOLN
4.0000 mg | Freq: Once | INTRAMUSCULAR | Status: AC
  Administered 2013-05-01: 4 mg via INTRAVENOUS
  Filled 2013-05-01: qty 2

## 2013-05-01 MED ORDER — HEPARIN (PORCINE) IN NACL 100-0.45 UNIT/ML-% IJ SOLN
1500.0000 [IU]/h | INTRAMUSCULAR | Status: DC
  Administered 2013-05-01: 1000 [IU]/h via INTRAVENOUS
  Administered 2013-05-02: 1150 [IU]/h via INTRAVENOUS
  Filled 2013-05-01 (×3): qty 250

## 2013-05-01 MED ORDER — SODIUM CHLORIDE 0.9 % IV SOLN: 250.0000 mL | INTRAVENOUS | Status: DC | PRN

## 2013-05-01 MED ORDER — TENECTEPLASE 50 MG IV KIT
0.5000 mg/h | PACK | INTRAVENOUS | Status: DC
  Administered 2013-05-01 – 2013-05-02 (×4): 0.5 mg/h via INTRAVENOUS
  Filled 2013-05-01 (×5): qty 0.5

## 2013-05-01 MED ORDER — HEPARIN BOLUS VIA INFUSION
4000.0000 [IU] | Freq: Once | INTRAVENOUS | Status: AC
  Administered 2013-05-01: 4000 [IU] via INTRAVENOUS
  Filled 2013-05-01: qty 4000

## 2013-05-01 MED ORDER — HYDROMORPHONE HCL PF 1 MG/ML IJ SOLN
INTRAMUSCULAR | Status: AC | PRN
  Administered 2013-05-01 (×2): 1 mg

## 2013-05-01 MED ORDER — ONDANSETRON HCL 4 MG/2ML IJ SOLN
4.0000 mg | Freq: Four times a day (QID) | INTRAMUSCULAR | Status: DC | PRN
  Administered 2013-05-01: 4 mg via INTRAVENOUS
  Filled 2013-05-01: qty 2

## 2013-05-01 MED ORDER — DEXTROSE 50 % IV SOLN
50.0000 mL | Freq: Once | INTRAVENOUS | Status: DC
  Filled 2013-05-01: qty 50

## 2013-05-01 MED ORDER — BIOTENE DRY MOUTH MT LIQD
15.0000 mL | Freq: Two times a day (BID) | OROMUCOSAL | Status: DC
  Administered 2013-05-01 – 2013-05-04 (×6): 15 mL via OROMUCOSAL

## 2013-05-01 MED ORDER — IODIXANOL 320 MG/ML IV SOLN
100.0000 mL | Freq: Once | INTRAVENOUS | Status: AC | PRN
  Administered 2013-05-01: 120 mL via INTRAVENOUS

## 2013-05-01 MED ORDER — MIDAZOLAM HCL 2 MG/2ML IJ SOLN
INTRAMUSCULAR | Status: AC
  Filled 2013-05-01: qty 4

## 2013-05-01 MED ORDER — HYDROMORPHONE HCL PF 1 MG/ML IJ SOLN
INTRAMUSCULAR | Status: AC
  Filled 2013-05-01: qty 3

## 2013-05-01 MED ORDER — ALTEPLASE 2 MG IJ SOLR
Freq: Once | INTRAMUSCULAR | Status: DC
  Filled 2013-05-01: qty 100

## 2013-05-01 MED ORDER — FENTANYL CITRATE 0.05 MG/ML IJ SOLN
INTRAMUSCULAR | Status: AC
  Filled 2013-05-01: qty 4

## 2013-05-01 MED ORDER — FENTANYL CITRATE 0.05 MG/ML IJ SOLN
INTRAMUSCULAR | Status: AC
  Filled 2013-05-01: qty 2

## 2013-05-01 MED ORDER — FENTANYL CITRATE 0.05 MG/ML IJ SOLN
50.0000 ug | INTRAMUSCULAR | Status: DC | PRN
  Administered 2013-05-01 (×4): 50 ug via INTRAVENOUS
  Filled 2013-05-01 (×2): qty 2

## 2013-05-01 MED ORDER — SODIUM CHLORIDE 0.9 % IJ SOLN: 3.0000 mL | INTRAMUSCULAR | Status: DC | PRN

## 2013-05-01 MED ORDER — SODIUM CHLORIDE 0.9 % IJ SOLN
3.0000 mL | Freq: Two times a day (BID) | INTRAMUSCULAR | Status: DC
  Administered 2013-05-01: 3 mL via INTRAVENOUS

## 2013-05-01 MED ORDER — MIDAZOLAM HCL 2 MG/2ML IJ SOLN
INTRAMUSCULAR | Status: AC | PRN
  Administered 2013-05-01: 1 mg via INTRAVENOUS
  Administered 2013-05-01: 2 mg via INTRAVENOUS
  Administered 2013-05-01 (×5): 1 mg via INTRAVENOUS

## 2013-05-01 MED ORDER — ALTEPLASE 100 MG IV SOLR: 8.0000 mg | Freq: Once | INTRAVENOUS | Status: DC

## 2013-05-01 MED ORDER — SODIUM CHLORIDE 0.9 % IV SOLN
INTRAVENOUS | Status: DC
  Administered 2013-05-01: 100 mL/h via INTRAVENOUS
  Administered 2013-05-01 – 2013-05-02 (×3): via INTRAVENOUS
  Administered 2013-05-03: 10 mL/h via INTRAVENOUS

## 2013-05-01 MED ORDER — HEPARIN SODIUM (PORCINE) 1000 UNIT/ML IJ SOLN
INTRAMUSCULAR | Status: AC
  Filled 2013-05-01: qty 1

## 2013-05-01 MED ORDER — MIDAZOLAM HCL 2 MG/2ML IJ SOLN
2.0000 mg | Freq: Once | INTRAMUSCULAR | Status: AC
  Administered 2013-05-01: 2 mg via INTRAVENOUS

## 2013-05-01 MED ORDER — ALTEPLASE 100 MG IV SOLR
2.0000 mg | Freq: Once | INTRAVENOUS | Status: DC
  Filled 2013-05-01: qty 2

## 2013-05-01 MED ORDER — FENTANYL CITRATE 0.05 MG/ML IJ SOLN
INTRAMUSCULAR | Status: DC | PRN
  Administered 2013-05-01 (×7): 50 ug via INTRAVENOUS

## 2013-05-01 MED ORDER — ONDANSETRON HCL 4 MG/2ML IJ SOLN
4.0000 mg | Freq: Three times a day (TID) | INTRAMUSCULAR | Status: DC | PRN
  Administered 2013-05-01: 4 mg via INTRAVENOUS
  Filled 2013-05-01: qty 2

## 2013-05-01 MED ORDER — FENTANYL CITRATE 0.05 MG/ML IJ SOLN: 50.0000 ug | Freq: Once | INTRAMUSCULAR | Status: DC

## 2013-05-01 NOTE — H&P (Signed)
PULMONARY / CRITICAL CARE MEDICINE   Name: Linda Dalton MRN: [id number] DOB: 01/12/1976    ADMISSION DATE:  05/01/2013  REFERRING MD :  EDP  PRIMARY SERVICE: PCCM   CHIEF COMPLAINT:  Extensive Left DVT -for IR -TPA   BRIEF PATIENT DESCRIPTION: 38 yo AAF presented to ER with increased left leg swelling/pain found to have extensive LLE DVT .  Was on "hormone study drug " 6 weeks ago for fibroid.  DVT noted on Left started on Lovenox prior to Hysterectomy 4/2 . Post op sent home on Heparin SQ ?  Pt to have IR cath directed TPA. Pt to ICU post procedure with PCCM to admit.   SIGNIFICANT EVENTS / STUDIES:  Venous doppler 4/19 >extensive acute DVT left post tib/popliteal/common ferm veins   LINES / TUBES:   CULTURES:   ANTIBIOTICS:   HISTORY OF PRESENT ILLNESS:   38 yo AAF presented to ER with increased left leg swelling/pain found to have extensive LLE DVT .  Was on "hormone study drug " 6 weeks ago for fibroid by GYN. DVT noted on Left started on Lovenox prior to Hysterectomy 4/2 . Post op sent home on Heparin SQ ? Says that she was seen by vein specialist after dx with DVT . They recommended hysterectomy d/t vein compression from a very large fibroid. (weighed 5lbs)  Says she stopped lovenox prior to surgery and at discharge could not afford lovenox and was placed on heparin sq 3 shots daily .  Leg swelling did not get better after surgery and last couple of days, pain worse in left groin.  No chest pain, dyspnea, fever, hemoptysis.  No recent travel, or injury  No known hx of blood clots or clotting disorders. No FH of clotting disorders or clots.  She has 4 children , youngest 46 yrs old.  Prior to study hormone drug, she was last on BCP years ago .  IR evaluation and pt has consented to go to IR for catheter directed TPA d/t extensive clot. She will be monitor in ICU post procedure w/ PCCM to admit.  She has had no vaginal bleeding since hysteretomy. No HA.    PAST  MEDICAL HISTORY :  Past Medical History  Diagnosis Date  . Asthma   . Fibroid tumor    Past Surgical History  Procedure Laterality Date  . Breast surgery    . Tubal ligation     Prior to Admission medications   Medication Sig Start Date End Date Taking? Authorizing Provider  acetaminophen (TYLENOL) 500 MG tablet Take 500-1,000 mg by mouth every 8 (eight) hours as needed for moderate pain or headache.    Yes Historical Provider, MD  ferrous sulfate 325 (65 FE) MG tablet Take 325 mg by mouth daily with breakfast.   Yes Historical Provider, MD  heparin 5000 UNIT/ML injection Inject 5,000 Units into the skin every 8 (eight) hours. Blood clot in legs   Yes Historical Provider, MD   Allergies  Allergen Reactions  . Biaxin [Clarithromycin] Swelling    Mouth swelling  . Codeine Hives    FAMILY HISTORY:  No family history on file. SOCIAL HISTORY:  reports that she has been smoking.  She does not have any smokeless tobacco history on file. She reports that she does not drink alcohol or use illicit drugs.  REVIEW OF SYSTEMS:   Constitutional:   No  weight loss, night sweats,  Fevers, chills, fatigue, or  lassitude.  HEENT:   No headaches,  Difficulty swallowing,  Tooth/dental problems, or  Sore throat,                No sneezing, itching, ear ache, nasal congestion, post nasal drip,   CV:  No chest pain,  Orthopnea, PND,+swelling in lower extremities on left , anasarca, dizziness, palpitations, syncope. +left calf pain   GI  No heartburn, indigestion, abdominal pain, nausea, vomiting, diarrhea, change in bowel habits, loss of appetite, bloody stools.   Resp: No shortness of breath with exertion or at rest.  No excess mucus, no productive cough,  No non-productive cough,  No coughing up of blood.  No change in color of mucus.  No wheezing.  No chest wall deformity  Skin: no rash or lesions.  GU: no dysuria, change in color of urine, no urgency or frequency.  No flank pain, no hematuria    MS: +left leg pain .  No decreased range of motion.  No back pain.  Psych:  No change in mood or affect. No depression or anxiety.  No memory loss.       SUBJECTIVE:   VITAL SIGNS: Temp:  [98.5 F (36.9 C)-98.6 F (37 C)] 98.6 F (37 C) (04/19 0837) Pulse Rate:  [75-96] 87 (04/19 0945) Resp:  [16] 16 (04/19 0837) BP: (107-120)/(68-82) 111/75 mmHg (04/19 0945) SpO2:  [97 %-100 %] 99 % (04/19 0945) Weight:  [73.228 kg (161 lb 7 oz)] 73.228 kg (161 lb 7 oz) (04/19 0120) HEMODYNAMICS:   VENTILATOR SETTINGS:   INTAKE / OUTPUT: Intake/Output   None     PHYSICAL EXAMINATION: GEN: A/Ox3; pleasant , NAD, well nourished   HEENT:  [place]/AT,  EACs-clear, TMs-wnl, NOSE-clear, THROAT-clear, no lesions, no postnasal drip or exudate noted.   NECK:  Supple w/ fair ROM; no JVD; normal carotid impulses w/o bruits; no thyromegaly or nodules palpated; no lymphadenopathy.  RESP  Clear  P & A; w/o, wheezes/ rales/ or rhonchi.no accessory muscle use, no dullness to percussion  CARD:  RRR, no m/r/g  , asymmetrical leg swelling on left ,  pulses intact, no cyanosis or clubbing. +homans sign on left   GI:   Soft & nt; nml bowel sounds; no organomegaly or masses detected.  Musco: Warm bil, no deformities or joint swelling noted.   Neuro: alert, no focal deficits noted.    Skin: Warm, no lesions or rashes   LABS:  CBC  Recent Labs Lab 05/01/13 0137  WBC 6.7  HGB 10.0*  HCT 31.3*  PLT 584*   Coag's  Recent Labs Lab 05/01/13 0137  APTT 32  INR 1.00   BMET  Recent Labs Lab 05/01/13 0137  NA 138  K 3.8  CL 100  CO2 23  BUN 12  CREATININE 0.60  GLUCOSE 100*   Electrolytes  Recent Labs Lab 05/01/13 0137  CALCIUM 9.3   Sepsis Markers No results found for this basename: LATICACIDVEN, PROCALCITON, O2SATVEN,  in the last 168 hours ABG No results found for this basename: PHART, PCO2ART, PO2ART,  in the last 168 hours Liver Enzymes  Recent Labs Lab  05/01/13 0137  AST 25  ALT 45*  ALKPHOS 241*  BILITOT 0.3  ALBUMIN 3.0*   Cardiac Enzymes No results found for this basename: TROPONINI, PROBNP,  in the last 168 hours Glucose No results found for this basename: GLUCAP,  in the last 168 hours  Imaging No results found.   CXR: pending  ASSESSMENT / PLAN:  PULMONARY A: At risk for PE d/t extensive  DVT >no signs of PE w/ no dyspnea/chest pain/hypoxia   P:   Monitor closely  Check cxr  Consider echo after TPA for pa pressures for ? Chronic thromboembolic dz  CARDIOVASCULAR A:Acute left extensive DVT  P:  Hep Drip  For TPA IR cath directed  Consider echo after TPA   RENAL A:  Nml renal fxn  P:   Monitor and replete electrolytes closely   GASTROINTESTINAL A:  No active issues  P:   Monitor after back in icu for food intake  HEMATOLOGIC A:  Acute extensive Left DVT (post recent hormone study drug /hysterectomy)  Anemia (recent post op )  P:  For IR -catheter guided TPA ICU for monitoring post procedure  On hep drip  Monitor cbc closely -post TPA  Pain meds As needed    INFECTIOUS A:  No active issues  P:   Tr wbc and temp   ENDOCRINE A:  No active issues  P:   Monitor   NEUROLOGIC A:  S/o tnk, low risk ICH  P:   Monitor neuro per protocol IR Any changes dc anticoagulation and stat ct head, cryo  TODAY'S SUMMARY: 38 yo AAF with extensive acute left leg DVT for IR cath guided TPA .   I have personally obtained a history, examined the patient, evaluated laboratory and imaging results, formulated the assessment and plan and placed orders.  Melvenia Needles NP-C  Pulmonary and Critical Care Medicine [facility] Pager: [phone]  05/01/2013, 11:18 AM   I have fully examined this patient and agree with above findings.     Lavon Paganini. Titus Mould, MD, [place] Pgr: [facility]

## 2013-05-01 NOTE — ED Notes (Signed)
Sent release of information [facility] for Med Rec.

## 2013-05-01 NOTE — ED Notes (Signed)
Patient transported to Ultrasound 

## 2013-05-01 NOTE — Consult Note (Signed)
Reason for Consult: LLE DVT Referring Physician: Dr. Mingo Dalton, Emergency Dept  Linda Dalton is an 38 y.o. female.  HPI: Pt with history of uterine fibroid and recent diagnosis of small burden LLE DVT April 1st of this year. She was prescribed SQ heparin 5000 units q 8 hrs. She underwent hysterectomy secondary to fibroid in [place] earlier this month. She presents now with increased pain/swelling LLE and venous doppler revealing acute DVT throughout the left Dalton extremity including the posterior tibial, popliteal, femoral, and common femoral veins. Request has been received for possible catheter directed  thrombolytic therapy to expedite resolution of thrombus. Pt denies hx of bleeding diathesis / malignancy or any other recent surgical procedures other than above noted hysterectomy. She denies hx of HTN,DM, CAD.   Past Medical History  Diagnosis Date  . Asthma   . Fibroid tumor     Past Surgical History  Procedure Laterality Date  . Breast surgery    . Tubal ligation      No family history on file.  Social History:  reports that she has been smoking.  She does not have any smokeless tobacco history on file. She reports that she does not drink alcohol or use illicit drugs.  Allergies:  Allergies  Allergen Reactions  . Biaxin [Clarithromycin] Swelling    Mouth swelling  . Codeine Hives    Current facility-administered medications:fentaNYL (SUBLIMAZE) injection 50 mcg, 50 mcg, Intravenous, Q1H PRN, Linda Lower, MD, 50 mcg at 05/01/13 0426;  heparin ADULT infusion 100 units/mL (25000 units/250 mL), 1,000 Units/hr, Intravenous, Continuous, Linda Dalton, Linda Dalton, Last Rate: 10 mL/hr at 05/01/13 1034, 1,000 Units/hr at 05/01/13 1034 Current outpatient prescriptions:acetaminophen (TYLENOL) 500 MG tablet, Take 500-1,000 mg by mouth every 8 (eight) hours as needed for moderate pain or headache. , Disp: , Rfl: ;  ferrous sulfate 325 (65 FE) MG tablet, Take 325 mg by mouth daily with  breakfast., Disp: , Rfl: ;  heparin 5000 UNIT/ML injection, Inject 5,000 Units into the skin every 8 (eight) hours. Blood clot in legs, Disp: , Rfl:    Results for orders placed during the Dalton encounter of 05/01/13 (from the past 48 hour(s))  CBC WITH DIFFERENTIAL     Status: Abnormal   Collection Time    05/01/13  1:37 AM      Result Value Ref Range   WBC 6.7  4.0 - 10.5 K/uL   RBC 3.75 (*) 3.87 - 5.11 MIL/uL   Hemoglobin 10.0 (*) 12.0 - 15.0 g/dL   HCT 31.3 (*) 36.0 - 46.0 %   MCV 83.5  78.0 - 100.0 fL   MCH 26.7  26.0 - 34.0 pg   MCHC 31.9  30.0 - 36.0 g/dL   RDW 14.9  11.5 - 15.5 %   Platelets 584 (*) 150 - 400 K/uL   Neutrophils Relative % 57  43 - 77 %   Neutro Abs 3.8  1.7 - 7.7 K/uL   Lymphocytes Relative 31  12 - 46 %   Lymphs Abs 2.1  0.7 - 4.0 K/uL   Monocytes Relative 10  3 - 12 %   Monocytes Absolute 0.7  0.1 - 1.0 K/uL   Eosinophils Relative 2  0 - 5 %   Eosinophils Absolute 0.2  0.0 - 0.7 K/uL   Basophils Relative 0  0 - 1 %   Basophils Absolute 0.0  0.0 - 0.1 K/uL  COMPREHENSIVE METABOLIC PANEL     Status: Abnormal   Collection Time  05/01/13  1:37 AM      Result Value Ref Range   Sodium 138  137 - 147 mEq/L   Potassium 3.8  3.7 - 5.3 mEq/L   Chloride 100  96 - 112 mEq/L   CO2 23  19 - 32 mEq/L   Glucose, Bld 100 (*) 70 - 99 mg/dL   BUN 12  6 - 23 mg/dL   Creatinine, Ser 0.60  0.50 - 1.10 mg/dL   Calcium 9.3  8.4 - 10.5 mg/dL   Total Protein 8.2  6.0 - 8.3 g/dL   Albumin 3.0 (*) 3.5 - 5.2 g/dL   AST 25  0 - 37 U/L   ALT 45 (*) 0 - 35 U/L   Alkaline Phosphatase 241 (*) 39 - 117 U/L   Total Bilirubin 0.3  0.3 - 1.2 mg/dL   GFR calc non Af Amer >90  >90 mL/min   GFR calc Af Amer >90  >90 mL/min   Comment: (NOTE)     The eGFR has been calculated using the CKD EPI equation.     This calculation has not been validated in all clinical situations.     eGFR's persistently <90 mL/min signify possible Chronic Kidney     Disease.  PROTIME-INR     Status:  None   Collection Time    05/01/13  1:37 AM      Result Value Ref Range   Prothrombin Time 13.0  11.6 - 15.2 seconds   INR 1.00  0.00 - 1.49  APTT     Status: None   Collection Time    05/01/13  1:37 AM      Result Value Ref Range   aPTT 32  24 - 37 seconds    No results found.  Review of Systems  Constitutional: Negative for fever and chills.  Respiratory: Negative for cough, hemoptysis and shortness of breath.   Cardiovascular: Negative for chest pain.  Gastrointestinal: Negative for nausea, vomiting and blood in stool.       Mild pelvic discomfort, esp LLQ  Genitourinary: Negative for dysuria and hematuria.  Musculoskeletal: Positive for back pain.       Left leg pain /swelling  Neurological: Negative for headaches.  Endo/Heme/Allergies: Does not bruise/bleed easily.   Blood pressure 110/68, pulse 82, temperature 98.6 F (37 C), temperature source Oral, resp. rate 16, height 5' 8"  (1.727 m), weight 161 lb 7 oz (73.228 kg), last menstrual period 03/15/2013, SpO2 99.00%. Physical Exam  Constitutional: She is oriented to person, place, and time. She appears well-developed and well-nourished.  Cardiovascular: Normal rate, regular rhythm and intact distal pulses.   Respiratory: Effort normal.  Few insp wheezes noted, otherwise clear  GI: Soft. Bowel sounds are normal.  Midline scar from recent hyst; palpable ST mass mid pelvic region,NT  Musculoskeletal: Normal range of motion.  1-2+ edema LLE, tender to palpation, right with no sig edema  Neurological: She is alert and oriented to person, place, and time.    Assessment/Plan: Pt with hx pain/swelling LLE secondary to enlarging DVT, recent hyst for fibroid; on OP therapy with SQ heparin. With noted extension of DVT despite SQ heparin and following hysterectomy concern is raised for possible May Thurner syndrome as cause. The pt has been assessed by Dr. Laurence Dalton and deemed safe for catheter directed venous  thrombolysis/mechanical thrombectomy(angiojet) of LLE DVT with possible angioplasty/stenting. IV heparin will be started now.  Details/risks of procedure d/w pt with her understanding and consent. If infusion initiated pt  will be transferred to MICU postprocedure for close observation . Progression will be monitored with subsequent venography.  Crissie Sickles Allred 05/01/2013, 10:26 AM

## 2013-05-01 NOTE — ED Provider Notes (Signed)
CSN: [id number]     Arrival date & time 05/01/13  0112 History   First MD Initiated Contact with Patient 05/01/13 0402     Chief Complaint  Patient presents with  . Leg Pain     (Consider location/radiation/quality/duration/timing/severity/associated sxs/prior Treatment) HPI History provided by patient. Currently on heparin being treated for DVT of left lower extremity. Diagnosed 04-13-13. Patient was admitted to the hospital and had a hysterectomy for fibroid uterus. She was discharged home with improving left lower extremity/calf swelling. She had been doing well until yesterday when she began noticing left thigh pain and left thigh swelling. No chest pain or shortness of breath. Doing well otherwise from her surgery. No fevers or chills. No weakness or numbness. She denies any missed doses of medications. She was unable to afford Lovenox. Surgery was at [facility] in [place].   Past Medical History  Diagnosis Date  . Asthma   . Fibroid tumor    Past Surgical History  Procedure Laterality Date  . Breast surgery    . Tubal ligation     No family history on file. History  Substance Use Topics  . Smoking status: Current Every Day Smoker  . Smokeless tobacco: Not on file  . Alcohol Use: No   OB History   Grav Para Term Preterm Abortions TAB SAB Ect Mult Living                 Review of Systems  Constitutional: Negative for fever and chills.  Respiratory: Negative for shortness of breath.   Cardiovascular: Positive for leg swelling. Negative for chest pain.  Gastrointestinal: Negative for abdominal pain.  Genitourinary: Negative for dysuria.  Musculoskeletal: Negative for neck pain.  Skin: Negative for rash.  Neurological: Negative for weakness and numbness.  All other systems reviewed and are negative.     Allergies  Biaxin and Codeine  Home Medications   Prior to Admission medications   Medication Sig Start Date End Date Taking? Authorizing  Provider  acetaminophen (TYLENOL) 500 MG tablet Take 2,000 mg by mouth every 8 (eight) hours as needed for pain.    Historical Provider, MD  benzocaine (HURRICAINE) 20 % SOLN Use as directed 1 application in the mouth or throat 3 (three) times daily as needed. As needed for sore throat 08/11/12   Jarrett Soho Muthersbaugh, PA-C  cephALEXin (KEFLEX) 500 MG capsule Take 1 capsule (500 mg total) by mouth 4 (four) times daily. 07/22/12   Tatyana A Kirichenko, PA-C  fluticasone (FLONASE) 50 MCG/ACT nasal spray Place 2 sprays into the nose daily. 08/11/12   Hannah Muthersbaugh, PA-C  guaiFENesin (MUCINEX) 600 MG 12 hr tablet Take 2 tablets (1,200 mg total) by mouth 2 (two) times daily. 08/11/12   Hannah Muthersbaugh, PA-C  HYDROcodone-acetaminophen (NORCO) 5-325 MG per tablet Take 1 tablet by mouth every 6 (six) hours as needed for pain. 07/22/12   Tatyana A Kirichenko, PA-C  ibuprofen (ADVIL,MOTRIN) 200 MG tablet Take 400 mg by mouth every 6 (six) hours as needed for pain.    Historical Provider, MD  metroNIDAZOLE (FLAGYL) 500 MG tablet Take 1 tablet (500 mg total) by mouth 2 (two) times daily. 07/22/12   Tatyana A Kirichenko, PA-C  Multiple Vitamins-Minerals (MULTIVITAMIN WITH MINERALS) tablet Take 1 tablet by mouth daily.    Historical Provider, MD  ondansetron (ZOFRAN ODT) 8 MG disintegrating tablet Take 1 tablet (8 mg total) by mouth every 8 (eight) hours as needed for nausea. 07/22/12   Florene Route Kirichenko, PA-C  Tetrahydrozoline HCl (VISINE OP) Place 1 drop into both eyes 2 (two) times daily as needed (dry eyes).    Historical Provider, MD   BP 110/75  Pulse 96  Temp(Src) 98.5 F (36.9 C) (Oral)  Resp 16  Ht 5\' 8"  (1.727 m)  Wt 161 lb 7 oz (73.228 kg)  BMI 24.55 kg/m2  SpO2 99%  LMP 03/15/2013 Physical Exam  Constitutional: She is oriented to person, place, and time. She appears well-developed and well-nourished.  HENT:  Head: Normocephalic and atraumatic.  Eyes: EOM are normal. Pupils are equal,  round, and reactive to light.  Neck: Neck supple.  Cardiovascular: Normal rate, regular rhythm and intact distal pulses.   Pulmonary/Chest: Effort normal. No respiratory distress.  Abdominal: Soft. She exhibits no distension. There is no tenderness.  Musculoskeletal:  Left thigh tenderness and swelling versus right lower extremity. No palpable cord, erythema or increased warmth to touch. Equal distal pulses intact. No Tenderness or swelling.  Neurological: She is alert and oriented to person, place, and time. No cranial nerve deficit.  Skin: Skin is warm and dry.    ED Course  Procedures (including critical care time) Labs Review Labs Reviewed  CBC WITH DIFFERENTIAL - Abnormal; Notable for the following:    RBC 3.75 (*)    Hemoglobin 10.0 (*)    HCT 31.3 (*)    Platelets 584 (*)    All other components within normal limits  COMPREHENSIVE METABOLIC PANEL - Abnormal; Notable for the following:    Glucose, Bld 100 (*)    Albumin 3.0 (*)    ALT 45 (*)    Alkaline Phosphatase 241 (*)    All other components within normal limits  PROTIME-INR  APTT   IV fentanyl pain control  7:15 AM [place] pending - Dr Mingo Amber to follow   MDM   Dx: DVT LLE  No PE symptoms Old records requested from [facility] reviewed IV narcotics    Teressa Lower, MD 05/02/13 [phone]

## 2013-05-01 NOTE — ED Notes (Signed)
Report given to IR RN

## 2013-05-01 NOTE — ED Notes (Signed)
The pt is here for lt leg pain and swelling .  She was diagnosed with a dvt  April the 1st.  She had  A part hysterectomy 3 weeks ago.  She has been taking sq heparin since April 1st.  Her pain and swelling went down until yesterday when the pain and swelling returned.  The pain is from the lt groin thigh and lower leg

## 2013-05-01 NOTE — Progress Notes (Signed)
ANTICOAGULATION CONSULT NOTE - Follow Up Consult  Pharmacy Consult for Heparin Indication: DVT  Allergies  Allergen Reactions  . Biaxin [Clarithromycin] Swelling    Mouth swelling  . Codeine Hives    Patient Measurements: Height: 5\' 8"  (172.7 cm) Weight: 161 lb 7 oz (73.228 kg) IBW/kg (Calculated) : 63.9 Heparin Dosing Weight: 73 kg  Vital Signs: Temp: 98.9 F (37.2 C) (04/19 1534) Temp src: Oral (04/19 1534) BP: 131/95 mmHg (04/19 1700) Pulse Rate: 85 (04/19 1700)  Labs:  Recent Labs  05/01/13 0137 05/01/13 1610 05/01/13 1645  HGB 10.0*  --  9.3*  HCT 31.3*  --  29.0*  PLT 584*  --  395  APTT 32  --   --   LABPROT 13.0  --   --   INR 1.00  --   --   HEPARINUNFRC  --  <0.10*  --   CREATININE 0.60  --   --     Estimated Creatinine Clearance: 98.1 ml/min (by C-G formula based on Cr of 0.6).   Assessment: 38 y/o F with DVT s/p thrombolysis/thrombectomy of LLE, angioplasty of femoral veins, L common and external iliac stent, and tNK infusion. Patient is on IV heparin with level <0.1. RN reports heparin turned off around 1036 this AM but pt did come back to her from IR with heparin infusing around 1600. Questionable if heparin was running or not throughout procedure.  Goal of Therapy:  HL 0.2-0.5 Monitor platelets by anticoagulation protocol: Yes   Plan:  Continue IV heparin at 1000 units/hr. Recheck heparin level in 6 hrs.  Alexis Reber S. Alford Highland, PharmD, [facility] Clinical Staff Pharmacist Pager [phone]  Wayland Salinas 05/01/2013,5:30 PM

## 2013-05-01 NOTE — Progress Notes (Signed)
ANTICOAGULATION CONSULT NOTE - Initial Consult  Pharmacy Consult for heparin Indication: DVT  Allergies  Allergen Reactions  . Biaxin [Clarithromycin] Swelling    Mouth swelling  . Codeine Hives    Patient Measurements: Height: 5\' 8"  (172.7 cm) Weight: 161 lb 7 oz (73.228 kg) IBW/kg (Calculated) : 63.9 Heparin Dosing Weight: 73kg  Vital Signs: Temp: 98.6 F (37 C) (04/19 0837) Temp src: Oral (04/19 0837) BP: 110/68 mmHg (04/19 0837) Pulse Rate: 82 (04/19 0837)  Labs:  Recent Labs  05/01/13 0137  HGB 10.0*  HCT 31.3*  PLT 584*  APTT 32  LABPROT 13.0  INR 1.00  CREATININE 0.60    Estimated Creatinine Clearance: 98.1 ml/min (by C-G formula based on Cr of 0.6).   Medical History: Past Medical History  Diagnosis Date  . Asthma   . Fibroid tumor     Medications:  Sq heparin pta 5000 q8  Assessment: 38 year old female dx with DVT April 1st and prescribed sq heparin 5000 units q8, now has returned to ER with increased pain and swelling. Vascular notes DVT in left lower extremity. Orders to now start IV heparin. Patient may be eligible for mechanical thrombectomy. Last dose of heparin was yesterday.  Goal of Therapy:  Heparin level 0.3-0.7 units/ml Monitor platelets by anticoagulation protocol: Yes   Plan:  Give 4000 units bolus x 1 Start heparin infusion at 1000 units/hr Check anti-Xa level in 6 hours and daily while on heparin Continue to monitor H&H and platelets  Erin Hearing PharmD., BCPS Clinical Pharmacist Pager [phone] 05/01/2013 9:36 AM

## 2013-05-01 NOTE — Sedation Documentation (Signed)
Patient denies pain and is resting comfortably.  

## 2013-05-01 NOTE — ED Notes (Signed)
RN taking pt to X-ray then to IR. Pt reports she signed a consent. Pt belongings at bedside. Purse, cell phone, blue tooth and clothing.

## 2013-05-01 NOTE — ED Provider Notes (Signed)
0700 - Care from Dr. Marnette Burgess. 22F with recent hysterectomy for large fibroids. Had DVT in left leg, on subcutaneous heparin for clots. Recent L thigh swelling yesterday. Per what she described to Dr. Marnette Burgess, concern that fibroid uterus was causing the DVT secondary to mass effect.  8:10 AM Vascular lab called me - has large clot burden in L left. I spoke with Dr. Trula Slade with Vascular surgery, he stated subcutaneous heparin likely did not provide adequate anti-coagulatio. He also stated she would likely be a candidate for mechanical thrombectomy. 9:04 AM IR consulted, will come see patient, recommended admission with heparin therapy, and will evaluate for mechanical thrombectomy. IR states with their current catheters, she will need an ICU admission because the catheters use thrombolytic drips.   1. Left leg DVT      Osvaldo Shipper, MD 05/01/13 1128

## 2013-05-01 NOTE — Progress Notes (Signed)
VASCULAR LAB PRELIMINARY  PRELIMINARY  PRELIMINARY  PRELIMINARY  Left lower extremity venous Doppler completed.    Preliminary report:  There is acute DVT throughout the left lower extremity including the posterior tibial, popliteal, femoral, and common femoral veins.  There is no propagation to the right common femoral noted.  Iantha Fallen, RVT 05/01/2013, 7:53 AM

## 2013-05-01 NOTE — Procedures (Signed)
Interventional Radiology Procedure Note  Procedure:  1.) Pharmacomechanical thrombolysis/thrombectomy of LLE 2.) Angioplasty femoral and common femoral veins to 8 mm 3.) Placement of left common and external iliac stent post dilated to 64mm 4.) Initiation of venous thrombolysis Complications: Recurrent thrombosis at end of procedure, overnight lysis iniated Recommendations: - tNK at 0.5 mg/hr overnight - Q6hr labs per protocol - Must keep left leg straight  Signed,  Criselda Peaches, MD [facility] Radiology

## 2013-05-01 NOTE — Consult Note (Signed)
Agree with PA note.  I saw and examined Ms. Linda Dalton.  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  Her history of a very large uterine fibroid was likely contributory. If her left iliac vein has become thickened and fibrotic it will pose a persistent mechanical source of obstruction and slow flow severely limiting the effect of out-pt anticoagulants and predisposing her to repeated episodes of DVT.  Additionally, pt is at risk for post-thrombotic syndrome in the future if thrombus is left in place.  I discussed the risks, benefits and alternatives with her at length.  She is almost 3 weeks post hysterectomy and so her risk of hemorrhagic complications from lysis are elevated above baseline for this procedure.  To decrease the risk, we will minimize the amount of lytic and empoly mechanical adjunctive therapies.  I expect she will require iliac vein stenting as well.  She understands and wishes to proceed.    - BMP, CBC, fibrinogen and PT/INR ordered - to IR for pharmacomechanical thrombolysis/thrombectomy  Signed,  Criselda Peaches, MD [facility] Radiology

## 2013-05-02 ENCOUNTER — Inpatient Hospital Stay (HOSPITAL_COMMUNITY): Payer: Medicaid Other

## 2013-05-02 LAB — BASIC METABOLIC PANEL
BUN: 14 mg/dL (ref 6–23)
CHLORIDE: 106 meq/L (ref 96–112)
CO2: 23 meq/L (ref 19–32)
CREATININE: 0.63 mg/dL (ref 0.50–1.10)
Calcium: 8.1 mg/dL — ABNORMAL LOW (ref 8.4–10.5)
GFR calc Af Amer: >90 mL/min (ref >90)
GFR calc non Af Amer: >90 mL/min (ref >90)
GLUCOSE: 100 mg/dL — AB (ref 70–99)
Potassium: 3.8 mEq/L (ref 3.7–5.3)
Sodium: 140 mEq/L (ref 137–147)

## 2013-05-02 LAB — CBC
HCT: 27.4 % — ABNORMAL LOW (ref 36.0–46.0)
HCT: 27.2 % — ABNORMAL LOW (ref 36.0–46.0)
HEMATOCRIT: 29.4 % — AB (ref 36.0–46.0)
HEMOGLOBIN: 8.8 g/dL — AB (ref 12.0–15.0)
HEMOGLOBIN: 9.4 g/dL — AB (ref 12.0–15.0)
Hemoglobin: 8.7 g/dL — ABNORMAL LOW (ref 12.0–15.0)
MCH: 26.7 pg (ref 26.0–34.0)
MCH: 26.9 pg (ref 26.0–34.0)
MCH: 26.5 pg (ref 26.0–34.0)
MCHC: 32.1 g/dL (ref 30.0–36.0)
MCHC: 32 g/dL (ref 30.0–36.0)
MCHC: 32 g/dL (ref 30.0–36.0)
MCV: 83.3 fL (ref 78.0–100.0)
MCV: 84 fL (ref 78.0–100.0)
MCV: 82.9 fL (ref 78.0–100.0)
Platelets: 318 10*3/uL (ref 150–400)
Platelets: 335 10*3/uL (ref 150–400)
Platelets: 306 10*3/uL (ref 150–400)
RBC: 3.29 MIL/uL — ABNORMAL LOW (ref 3.87–5.11)
RBC: 3.5 MIL/uL — ABNORMAL LOW (ref 3.87–5.11)
RBC: 3.28 MIL/uL — AB (ref 3.87–5.11)
RDW: 15 % (ref 11.5–15.5)
RDW: 15 % (ref 11.5–15.5)
RDW: 14.7 % (ref 11.5–15.5)
WBC: 8.5 10*3/uL (ref 4.0–10.5)
WBC: 6.9 10*3/uL (ref 4.0–10.5)
WBC: 7.3 10*3/uL (ref 4.0–10.5)

## 2013-05-02 LAB — HEPARIN LEVEL (UNFRACTIONATED)
HEPARIN UNFRACTIONATED: 0.14 [IU]/mL — AB (ref 0.30–0.70)
HEPARIN UNFRACTIONATED: 0.1 [IU]/mL — AB (ref 0.30–0.70)
Heparin Unfractionated: 0.21 IU/mL — ABNORMAL LOW (ref 0.30–0.70)

## 2013-05-02 LAB — FIBRINOGEN
FIBRINOGEN: 153 mg/dL — AB (ref 204–475)
FIBRINOGEN: 281 mg/dL (ref 204–475)
Fibrinogen: 181 mg/dL — ABNORMAL LOW (ref 204–475)
Fibrinogen: 232 mg/dL (ref 204–475)

## 2013-05-02 MED ORDER — HEPARIN (PORCINE) IN NACL 100-0.45 UNIT/ML-% IJ SOLN
1650.0000 [IU]/h | INTRAMUSCULAR | Status: AC
  Administered 2013-05-03: 1450 [IU]/h via INTRAVENOUS

## 2013-05-02 MED ORDER — MIDAZOLAM HCL 2 MG/2ML IJ SOLN
INTRAMUSCULAR | Status: AC
  Filled 2013-05-02: qty 6

## 2013-05-02 MED ORDER — MIDAZOLAM HCL 2 MG/2ML IJ SOLN
INTRAMUSCULAR | Status: AC | PRN
  Administered 2013-05-02: 2 mg via INTRAVENOUS

## 2013-05-02 MED ORDER — HEPARIN (PORCINE) IN NACL 100-0.45 UNIT/ML-% IJ SOLN
1300.0000 [IU]/h | INTRAMUSCULAR | Status: DC
  Filled 2013-05-02: qty 250

## 2013-05-02 MED ORDER — FENTANYL CITRATE 0.05 MG/ML IJ SOLN
INTRAMUSCULAR | Status: AC | PRN
  Administered 2013-05-02 (×2): 50 ug via INTRAVENOUS

## 2013-05-02 MED ORDER — FENTANYL CITRATE 0.05 MG/ML IJ SOLN
INTRAMUSCULAR | Status: AC
  Filled 2013-05-02: qty 4

## 2013-05-02 MED ORDER — FENTANYL CITRATE 0.05 MG/ML IJ SOLN
50.0000 ug | INTRAMUSCULAR | Status: DC | PRN
  Administered 2013-05-02 (×5): 50 ug via INTRAVENOUS
  Filled 2013-05-02 (×5): qty 2

## 2013-05-02 MED ORDER — IOHEXOL 300 MG/ML  SOLN
100.0000 mL | Freq: Once | INTRAMUSCULAR | Status: AC | PRN
  Administered 2013-05-02: 50 mL via INTRAVENOUS

## 2013-05-02 NOTE — Progress Notes (Signed)
[facility] Progress Note Patient Name: Linda Dalton DOB: 05-23-1975 MRN: [id number]  Date of Service  05/02/2013   HPI/Events of Note   Patient receiving local TPA and heparin drip for extensive TPA.  She had hysterectomy 4/2.  Now c/o sharp abd pain.  abd soft on exam.  Hgb with slow drift down today.  VSS.  eICU Interventions  Pain control abd and pelvic US looking for development of hematoma Cont to follow hgb   Intervention Category Intermediate Interventions: Abdominal pain - evaluation and management  Melvia Heaps 05/02/2013, 12:28 AM

## 2013-05-02 NOTE — Care Management Note (Signed)
    Page 1 of 1   05/02/2013     9:41:40 AM CARE MANAGEMENT NOTE 05/02/2013  Patient:  Linda Dalton, Linda Dalton   Account Number:  [id number]  Date Initiated:  05/02/2013  Documentation initiated by:  Elissa Hefty  Subjective/Objective Assessment:   adm w extensive dvt     Action/Plan:   lives w fam   Anticipated DC Date:     Anticipated DC Plan:        [place]  CM consult  Medication Assistance      Choice offered to / List presented to:             Status of service:   Medicare Important Message given?   (If response is "NO", the following Medicare IM given date fields will be blank) Date Medicare IM given:   Date Additional Medicare IM given:    Discharge Disposition:    Per UR Regulation:  Reviewed for med. necessity/level of care/duration of stay  If discussed at [place] of Stay Meetings, dates discussed:    Comments:  4/20 0940 debbie Aneth Schlagel rn,bsn gave pt 30day free and copay card that brins cpay down to 0.00 per month.

## 2013-05-02 NOTE — Sedation Documentation (Signed)
Patient denies pain and is resting comfortably.  

## 2013-05-02 NOTE — Progress Notes (Signed)
Subjective: Pt without new c/o; tolerating diet ok; denies left leg discomfort  Objective: Vital signs in last 24 hours: Temp:  [97 F (36.1 C)-99.5 F (37.5 C)] 99.5 F (37.5 C) (04/20 1139) Pulse Rate:  [62-105] 102 (04/20 1255) Resp:  [17-29] 17 (04/20 1255) BP: (91-132)/(54-95) 116/75 mmHg (04/20 1255) SpO2:  [94 %-100 %] 98 % (04/20 1255) Weight:  [161 lb 9.6 oz (73.3 kg)] 161 lb 9.6 oz (73.3 kg) (04/20 0500) Last BM Date: 04/30/13  Intake/Output from previous day: 04/19 0701 - 04/20 0700 In: 3233.4 [P.O.:160; I.V.:2377.6; IV Piggyback:695.8] Out: 1500 [Urine:900; Emesis/NG output:600] Intake/Output this shift: Total I/O In: 1444.2 [I.V.:1156.7; IV Piggyback:287.5] Out: -   Left popliteal access site clean and dry, no sig hematoma; 1+ edema; intact distal pulses; foley with yellow urine  Lab Results:   Recent Labs  05/02/13 0357 05/02/13 1040  WBC 8.5 6.9  HGB 8.8* 9.4*  HCT 27.4* 29.4*  PLT 318 335   BMET  Recent Labs  05/01/13 0137 05/02/13 0357  NA 138 140  K 3.8 3.8  CL 100 106  CO2 23 23  GLUCOSE 100* 100*  BUN 12 14  CREATININE 0.60 0.63  CALCIUM 9.3 8.1*   PT/INR  Recent Labs  05/01/13 0137  LABPROT 13.0  INR 1.00   ABG No results found for this basename: PHART, PCO2, PO2, HCO3,  in the last 72 hours  Studies/Results: Dg Chest 1 View  05/01/2013   CLINICAL DATA:  DVT.  EXAM: CHEST - 1 VIEW  COMPARISON:  None.  FINDINGS: Midline trachea. Normal heart size and mediastinal contours. No pleural effusion or pneumothorax. Probable nipple shadow projecting over the right lung base. Lungs otherwise clear.  IMPRESSION: No acute cardiopulmonary disease.  Probable right-sided nipple shadow. A repeat frontal radiograph with nipple markers could confirm.   Electronically Signed   By: Abigail Miyamoto M.D.   On: 05/01/2013 12:28   Ir Veno/ext/uni Left  05/01/2013   CLINICAL DATA:  38 year old female who presents to the emergency room with acute (3  day) history of progressive and severe left lower extremity pain and swelling. She has a history of prior left lower extremity DVT at the end of March 2015 as well as a history of a massive uterine fibroid status post hysterectomy on 04/13/2013 with removal of a 5 pound uterine fibroid. She was on outpatient prophylactic subcutaneous heparin (5000 units subcu TID) and doing well until Thursday when her left lower extremity pain and discomfort began. She had an outpatient appointment on Monday but reported to the emergency room today due to intractable left lower extremity pain. Left lower extremity for micro mechanical thrombolysis/ thrombectomy is indicated. 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 Given her history of surgery 3 weeks ago, I will attempt to perform a single session thrombolysis/thrombectomy. If this is not achievable, she will require over night lysis.  EXAM: THROMBECTOMY MECHANICAL VENOUS; IR INFUSION THROMBOL VENOUS INITIAL (MS); IR TRANSCATH PLC STENT INITIAL VEIN INC ANGIOPLASTY; PTA VENOUS; LEFT EXTREMITY VENOGRAPHY; INFERIOR VENA CAVOGRAM; IR ULTRASOUND GUIDANCE VASC ACCESS LEFT  Date: 05/01/2013  PROCEDURE: 1. Ultrasound-guided puncture of the left popliteal vein 2. Catheterization of the inferior vena cava with vena cavagram 3. Left lower extremity venogram 4. Pharmacomechanical thrombolysis/thrombectomy procedure 5. Angioplasty of femoral and common femoral vein to 8 mm 6. Angioplasty of external iliac vein to 10 mm 7. 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 Initiation of venous lysis for recurrent thrombus formation Interventional Radiologist:  Criselda Peaches, MD  ANESTHESIA/SEDATION: Moderate (conscious) sedation was used. Ten mg Versed, 400 mcg Fentanyl and 2 mg Dilaudid were administered  intravenously. The patient's vital signs were monitored continuously by radiology nursing throughout the procedure.  Sedation Time: 115 minutes  FLUOROSCOPY TIME:  33 min  CONTRAST:  143mL VISIPAQUE IODIXANOL 320 MG/ML IV SOLN  TECHNIQUE: Informed consent was obtained from the patient following explanation of the procedure, risks, benefits and alternatives. The patient understands, agrees and consents for the procedure. All questions were addressed. A time out was performed.  Maximal barrier sterile technique utilized including caps, mask, sterile gowns, sterile gloves, large sterile drape, hand hygiene, and Betadine skin prep.  The popliteal fossa was interrogated with ultrasound. The thrombosed popliteal vein was identified. Local anesthesia was attained by infiltration with 1% lidocaine. Under real-time sonographic guidance, the vessel was punctured with a 21 gauge micropuncture needle. Image was obtained and stored for the medical record.  With the aid of a 5 [place] transitional micro sheath, a working 7 [place] vascular sheath was advanced into the popliteal vein. A gentle an injection of contrast material confirmed stagnant flow and thrombus. And angled vertebral catheter was navigated through the thrombus and into the IVC. There appears to be a high-grade stenosis in all common/external iliac vein region. This was very difficult pass with a wire and catheter. A pigtail catheter was advanced over the wire and placed at the IVC bifurcation. An inferior vena cavagram was performed. There is stenosis in the common iliac vein as well as a small amount of thrombus. The left common iliac vein and IVC are patent. Extensive thrombus is noted in the external iliac and common femoral veins.  A total of 5 mg TPA diluted in 100 mL of saline was then Lasix throughout the thrombosed left lower extremity using the power pulse spray function of the Angiojet catheter. The TPA was allowed to dwell for 25 min. During this time,  balloon maceration was performed in the femoral and common femoral vein using and 8 x 40 mm Conquest balloon. On the first inflation, a small amount (2 mg) of tPA was injected through the sheath to reflux into the thrombosed calf veins.  Following the 25 min the well time, the Angiojet was then used from the origin of the common iliac vein to the level of the sheath in the femoral vein. Two complete passes were performed. There is free aspiration of blood from the popliteal sheath. A contrast injection demonstrated complete patency of the femoral vein to the level of the lesser trochanter. There is some residual thrombus in the proximal femoral and common femoral veins. No definite thrombus in the external iliac vein. There is a high-grade flow limiting stenosis of the left common/external iliac vein at the sacral ala. No contrast material passes centrally. The pigtail catheter was readvanced into the IVC bifurcation. There is a small amount of persistent thrombus at the origin of the left internal iliac vein. The Angiojet was reintroduced and additional suction thrombectomy was performed. The thrombus was also macerated with a 10 x 40 mm Conquest balloon in the common femoral, external iliac and iliac veins. Hand injection of contrast material demonstrated improving patency of the stenosis. Decision was made to proceed with placement of a self expanding Nitinol stent to fully expand the stenotic  region. After this, any residual thrombus can be addressed. A 14 by 80 mm self expanding Cordis control Nitinol stent was selected and deployed from a just within the IVC into the external iliac vein. The stent was post dilated to 12 mm using a 12 by 40 mm atlas balloon.  Post stent dilatation angioplasty demonstrated significant reaccumulation of thrombus in the external iliac vein and within the stent. Given the amount of Angiojet thrombectomy already performed, the decision was made to delay further mechanical  thrombectomy. 890 cm total length, 50 cm infusion length Unifuse infusion catheter was selected. The catheter was placed in so that the distal marker is at the origin of the left common iliac vein and the proximal marker is within the distal left femoral vein.  The catheter and sheath were secured to the skin with 0 silk suture. A sterile bandage was applied. Venous lysis was initiated at 0.5 milligrams/hour tNK. Heparinization will continue.  IMPRESSION: 1. 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 2. Initially successful combined of pharmacomechanical thrombolysis/thrombectomy procedure. 3. Angioplasty of femoral and common femoral veins to 8 mm. 4. Placement of a 14 mm left common iliac vein stent extending into the left external iliac vein. The stent was post dilated to 12 mm. 5. Unfortunately, significant reaccumulation of thrombus occurred during balloon dilatation of the stent. This may have been secondary to sub therapeutic heparinization. 6. Initiation of venous lysis at 0.5 mg tNK / hr through a multi side hole 50 cm infusion length infusion catheter extending from the origin of the left common iliac vein into the distal femoral vein. - Continue heparinization per pharmacy protocol  - Serial CBC and fibrinogen every 6 hr over night. Evaluate for evidence of bleeding complication.  - Aggressive hydration to flush kidneys as there will be hemoglobinuria.  - Patient to return Interventional Radiology tomorrow for lysis check and further intervention if required.  Signed,  Criselda Peaches, MD  [facility]  [facility]   Electronically Signed   By: Jacqulynn Cadet M.D.   On: 05/01/2013 16:52   Ir Mariana Arn Ivc  05/01/2013   CLINICAL DATA:  38 year old female who presents to the emergency room with acute (3  day) history of progressive and severe left lower extremity pain and swelling. She has a history of prior left lower extremity DVT at the end of March 2015 as well as a history of a massive uterine fibroid status post hysterectomy on 04/13/2013 with removal of a 5 pound uterine fibroid. She was on outpatient prophylactic subcutaneous heparin (5000 units subcu TID) and doing well until Thursday when her left lower extremity pain and discomfort began. She had an outpatient appointment on Monday but reported to the emergency room today due to intractable left lower extremity pain. Left lower extremity for micro mechanical thrombolysis/ thrombectomy is indicated. 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 Given her history of surgery 3 weeks ago, I will attempt to perform a single session thrombolysis/thrombectomy. If this is not achievable, she will require over night lysis.  EXAM: THROMBECTOMY MECHANICAL VENOUS; IR INFUSION THROMBOL VENOUS INITIAL (MS); IR TRANSCATH PLC STENT INITIAL VEIN INC ANGIOPLASTY; PTA VENOUS; LEFT EXTREMITY VENOGRAPHY; INFERIOR VENA CAVOGRAM; IR ULTRASOUND GUIDANCE VASC ACCESS LEFT  Date: 05/01/2013  PROCEDURE: 1. Ultrasound-guided puncture of the left popliteal vein 2. Catheterization of the inferior vena cava with vena cavagram 3. Left lower extremity venogram 4. Pharmacomechanical thrombolysis/thrombectomy procedure 5. Angioplasty of femoral and common femoral vein to 8 mm 6. Angioplasty of external iliac vein to 10 mm 7. 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 Initiation of venous lysis for recurrent thrombus formation Interventional Radiologist:  Criselda Peaches, MD  ANESTHESIA/SEDATION: Moderate (conscious) sedation was used. Ten mg Versed, 400 mcg Fentanyl and 2 mg Dilaudid were administered  intravenously. The patient's vital signs were monitored continuously by radiology nursing throughout the procedure.  Sedation Time: 115 minutes  FLUOROSCOPY TIME:  33 min  CONTRAST:  151mL VISIPAQUE IODIXANOL 320 MG/ML IV SOLN  TECHNIQUE: Informed consent was obtained from the patient following explanation of the procedure, risks, benefits and alternatives. The patient understands, agrees and consents for the procedure. All questions were addressed. A time out was performed.  Maximal barrier sterile technique utilized including caps, mask, sterile gowns, sterile gloves, large sterile drape, hand hygiene, and Betadine skin prep.  The popliteal fossa was interrogated with ultrasound. The thrombosed popliteal vein was identified. Local anesthesia was attained by infiltration with 1% lidocaine. Under real-time sonographic guidance, the vessel was punctured with a 21 gauge micropuncture needle. Image was obtained and stored for the medical record.  With the aid of a 5 [place] transitional micro sheath, a working 7 [place] vascular sheath was advanced into the popliteal vein. A gentle an injection of contrast material confirmed stagnant flow and thrombus. And angled vertebral catheter was navigated through the thrombus and into the IVC. There appears to be a high-grade stenosis in all common/external iliac vein region. This was very difficult pass with a wire and catheter. A pigtail catheter was advanced over the wire and placed at the IVC bifurcation. An inferior vena cavagram was performed. There is stenosis in the common iliac vein as well as a small amount of thrombus. The left common iliac vein and IVC are patent. Extensive thrombus is noted in the external iliac and common femoral veins.  A total of 5 mg TPA diluted in 100 mL of saline was then Lasix throughout the thrombosed left lower extremity using the power pulse spray function of the Angiojet catheter. The TPA was allowed to dwell for 25 min. During this time,  balloon maceration was performed in the femoral and common femoral vein using and 8 x 40 mm Conquest balloon. On the first inflation, a small amount (2 mg) of tPA was injected through the sheath to reflux into the thrombosed calf veins.  Following the 25 min the well time, the Angiojet was then used from the origin of the common iliac vein to the level of the sheath in the femoral vein. Two complete passes were performed. There is free aspiration of blood from the popliteal sheath. A contrast injection demonstrated complete patency of the femoral vein to the level of the lesser trochanter. There is some residual thrombus in the proximal femoral and common femoral veins. No definite thrombus in the external iliac vein. There is a high-grade flow limiting stenosis of the left common/external iliac vein at the sacral ala. No contrast material passes centrally. The pigtail catheter was readvanced into the IVC bifurcation. There is a small amount of persistent thrombus at the origin of the left internal iliac vein. The Angiojet was reintroduced and additional suction thrombectomy  was performed. The thrombus was also macerated with a 10 x 40 mm Conquest balloon in the common femoral, external iliac and iliac veins. Hand injection of contrast material demonstrated improving patency of the stenosis. Decision was made to proceed with placement of a self expanding Nitinol stent to fully expand the stenotic region. After this, any residual thrombus can be addressed. A 14 by 80 mm self expanding Cordis control Nitinol stent was selected and deployed from a just within the IVC into the external iliac vein. The stent was post dilated to 12 mm using a 12 by 40 mm atlas balloon.  Post stent dilatation angioplasty demonstrated significant reaccumulation of thrombus in the external iliac vein and within the stent. Given the amount of Angiojet thrombectomy already performed, the decision was made to delay further mechanical  thrombectomy. 890 cm total length, 50 cm infusion length Unifuse infusion catheter was selected. The catheter was placed in so that the distal marker is at the origin of the left common iliac vein and the proximal marker is within the distal left femoral vein.  The catheter and sheath were secured to the skin with 0 silk suture. A sterile bandage was applied. Venous lysis was initiated at 0.5 milligrams/hour tNK. Heparinization will continue.  IMPRESSION: 1. 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 2. Initially successful combined of pharmacomechanical thrombolysis/thrombectomy procedure. 3. Angioplasty of femoral and common femoral veins to 8 mm. 4. Placement of a 14 mm left common iliac vein stent extending into the left external iliac vein. The stent was post dilated to 12 mm. 5. Unfortunately, significant reaccumulation of thrombus occurred during balloon dilatation of the stent. This may have been secondary to sub therapeutic heparinization. 6. Initiation of venous lysis at 0.5 mg tNK / hr through a multi side hole 50 cm infusion length infusion catheter extending from the origin of the left common iliac vein into the distal femoral vein. - Continue heparinization per pharmacy protocol  - Serial CBC and fibrinogen every 6 hr over night. Evaluate for evidence of bleeding complication.  - Aggressive hydration to flush kidneys as there will be hemoglobinuria.  - Patient to return Interventional Radiology tomorrow for lysis check and further intervention if required.  Signed,  Criselda Peaches, MD  [facility]  [facility]   Electronically Signed   By: Jacqulynn Cadet M.D.   On: 05/01/2013 16:52   Ir Pta Venous Left  05/01/2013   CLINICAL DATA:  39 year old female who presents to the emergency room with acute (3  day) history of progressive and severe left lower extremity pain and swelling. She has a history of prior left lower extremity DVT at the end of March 2015 as well as a history of a massive uterine fibroid status post hysterectomy on 04/13/2013 with removal of a 5 pound uterine fibroid. She was on outpatient prophylactic subcutaneous heparin (5000 units subcu TID) and doing well until Thursday when her left lower extremity pain and discomfort began. She had an outpatient appointment on Monday but reported to the emergency room today due to intractable left lower extremity pain. Left lower extremity for micro mechanical thrombolysis/ thrombectomy is indicated. 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 Given her history of surgery 3 weeks ago, I will  attempt to perform a single session thrombolysis/thrombectomy. If this is not achievable, she will require over night lysis.  EXAM: THROMBECTOMY MECHANICAL VENOUS; IR INFUSION THROMBOL VENOUS INITIAL (MS); IR TRANSCATH PLC STENT INITIAL VEIN INC ANGIOPLASTY; PTA VENOUS; LEFT EXTREMITY VENOGRAPHY; INFERIOR VENA CAVOGRAM; IR ULTRASOUND GUIDANCE VASC ACCESS LEFT  Date: 05/01/2013  PROCEDURE: 1. Ultrasound-guided puncture of the left popliteal vein 2. Catheterization of the inferior vena cava with vena cavagram 3. Left lower extremity venogram 4. Pharmacomechanical thrombolysis/thrombectomy procedure 5. Angioplasty of femoral and common femoral vein to 8 mm 6. Angioplasty of external iliac vein to 10 mm 7. 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 Initiation of venous lysis for recurrent thrombus formation Interventional Radiologist:  Criselda Peaches, MD  ANESTHESIA/SEDATION: Moderate (conscious) sedation was used. Ten mg Versed, 400 mcg Fentanyl and 2 mg Dilaudid were administered  intravenously. The patient's vital signs were monitored continuously by radiology nursing throughout the procedure.  Sedation Time: 115 minutes  FLUOROSCOPY TIME:  33 min  CONTRAST:  193mL VISIPAQUE IODIXANOL 320 MG/ML IV SOLN  TECHNIQUE: Informed consent was obtained from the patient following explanation of the procedure, risks, benefits and alternatives. The patient understands, agrees and consents for the procedure. All questions were addressed. A time out was performed.  Maximal barrier sterile technique utilized including caps, mask, sterile gowns, sterile gloves, large sterile drape, hand hygiene, and Betadine skin prep.  The popliteal fossa was interrogated with ultrasound. The thrombosed popliteal vein was identified. Local anesthesia was attained by infiltration with 1% lidocaine. Under real-time sonographic guidance, the vessel was punctured with a 21 gauge micropuncture needle. Image was obtained and stored for the medical record.  With the aid of a 5 [place] transitional micro sheath, a working 7 [place] vascular sheath was advanced into the popliteal vein. A gentle an injection of contrast material confirmed stagnant flow and thrombus. And angled vertebral catheter was navigated through the thrombus and into the IVC. There appears to be a high-grade stenosis in all common/external iliac vein region. This was very difficult pass with a wire and catheter. A pigtail catheter was advanced over the wire and placed at the IVC bifurcation. An inferior vena cavagram was performed. There is stenosis in the common iliac vein as well as a small amount of thrombus. The left common iliac vein and IVC are patent. Extensive thrombus is noted in the external iliac and common femoral veins.  A total of 5 mg TPA diluted in 100 mL of saline was then Lasix throughout the thrombosed left lower extremity using the power pulse spray function of the Angiojet catheter. The TPA was allowed to dwell for 25 min. During this time,  balloon maceration was performed in the femoral and common femoral vein using and 8 x 40 mm Conquest balloon. On the first inflation, a small amount (2 mg) of tPA was injected through the sheath to reflux into the thrombosed calf veins.  Following the 25 min the well time, the Angiojet was then used from the origin of the common iliac vein to the level of the sheath in the femoral vein. Two complete passes were performed. There is free aspiration of blood from the popliteal sheath. A contrast injection demonstrated complete patency of the femoral vein to the level of the lesser trochanter. There is some residual thrombus in the proximal femoral and common femoral veins. No definite thrombus in the external iliac vein. There  is a high-grade flow limiting stenosis of the left common/external iliac vein at the sacral ala. No contrast material passes centrally. The pigtail catheter was readvanced into the IVC bifurcation. There is a small amount of persistent thrombus at the origin of the left internal iliac vein. The Angiojet was reintroduced and additional suction thrombectomy was performed. The thrombus was also macerated with a 10 x 40 mm Conquest balloon in the common femoral, external iliac and iliac veins. Hand injection of contrast material demonstrated improving patency of the stenosis. Decision was made to proceed with placement of a self expanding Nitinol stent to fully expand the stenotic region. After this, any residual thrombus can be addressed. A 14 by 80 mm self expanding Cordis control Nitinol stent was selected and deployed from a just within the IVC into the external iliac vein. The stent was post dilated to 12 mm using a 12 by 40 mm atlas balloon.  Post stent dilatation angioplasty demonstrated significant reaccumulation of thrombus in the external iliac vein and within the stent. Given the amount of Angiojet thrombectomy already performed, the decision was made to delay further mechanical  thrombectomy. 890 cm total length, 50 cm infusion length Unifuse infusion catheter was selected. The catheter was placed in so that the distal marker is at the origin of the left common iliac vein and the proximal marker is within the distal left femoral vein.  The catheter and sheath were secured to the skin with 0 silk suture. A sterile bandage was applied. Venous lysis was initiated at 0.5 milligrams/hour tNK. Heparinization will continue.  IMPRESSION: 1. 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 2. Initially successful combined of pharmacomechanical thrombolysis/thrombectomy procedure. 3. Angioplasty of femoral and common femoral veins to 8 mm. 4. Placement of a 14 mm left common iliac vein stent extending into the left external iliac vein. The stent was post dilated to 12 mm. 5. Unfortunately, significant reaccumulation of thrombus occurred during balloon dilatation of the stent. This may have been secondary to sub therapeutic heparinization. 6. Initiation of venous lysis at 0.5 mg tNK / hr through a multi side hole 50 cm infusion length infusion catheter extending from the origin of the left common iliac vein into the distal femoral vein. - Continue heparinization per pharmacy protocol  - Serial CBC and fibrinogen every 6 hr over night. Evaluate for evidence of bleeding complication.  - Aggressive hydration to flush kidneys as there will be hemoglobinuria.  - Patient to return Interventional Radiology tomorrow for lysis check and further intervention if required.  Signed,  Criselda Peaches, MD  [facility]  [facility]   Electronically Signed   By: Jacqulynn Cadet M.D.   On: 05/01/2013 16:52   Ir Thrombect Veno Mech Mod Sed  05/01/2013   CLINICAL DATA:  38 year old female who presents to the emergency room with  acute (3 day) history of progressive and severe left lower extremity pain and swelling. She has a history of prior left lower extremity DVT at the end of March 2015 as well as a history of a massive uterine fibroid status post hysterectomy on 04/13/2013 with removal of a 5 pound uterine fibroid. She was on outpatient prophylactic subcutaneous heparin (5000 units subcu TID) and doing well until Thursday when her left lower extremity pain and discomfort began. She had an outpatient appointment on  Monday but reported to the emergency room today due to intractable left lower extremity pain. Left lower extremity for micro mechanical thrombolysis/ thrombectomy is indicated. 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 Given her history of surgery 3 weeks ago, I will attempt to perform a single session thrombolysis/thrombectomy. If this is not achievable, she will require over night lysis.  EXAM: THROMBECTOMY MECHANICAL VENOUS; IR INFUSION THROMBOL VENOUS INITIAL (MS); IR TRANSCATH PLC STENT INITIAL VEIN INC ANGIOPLASTY; PTA VENOUS; LEFT EXTREMITY VENOGRAPHY; INFERIOR VENA CAVOGRAM; IR ULTRASOUND GUIDANCE VASC ACCESS LEFT  Date: 05/01/2013  PROCEDURE: 1. Ultrasound-guided puncture of the left popliteal vein 2. Catheterization of the inferior vena cava with vena cavagram 3. Left lower extremity venogram 4. Pharmacomechanical thrombolysis/thrombectomy procedure 5. Angioplasty of femoral and common femoral vein to 8 mm 6. Angioplasty of external iliac vein to 10 mm 7. 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 Initiation of venous lysis for recurrent thrombus formation Interventional Radiologist:  Criselda Peaches, MD  ANESTHESIA/SEDATION: Moderate (conscious) sedation was used. Ten mg Versed, 400 mcg Fentanyl and 2 mg Dilaudid were administered  intravenously. The patient's vital signs were monitored continuously by radiology nursing throughout the procedure.  Sedation Time: 115 minutes  FLUOROSCOPY TIME:  33 min  CONTRAST:  134mL VISIPAQUE IODIXANOL 320 MG/ML IV SOLN  TECHNIQUE: Informed consent was obtained from the patient following explanation of the procedure, risks, benefits and alternatives. The patient understands, agrees and consents for the procedure. All questions were addressed. A time out was performed.  Maximal barrier sterile technique utilized including caps, mask, sterile gowns, sterile gloves, large sterile drape, hand hygiene, and Betadine skin prep.  The popliteal fossa was interrogated with ultrasound. The thrombosed popliteal vein was identified. Local anesthesia was attained by infiltration with 1% lidocaine. Under real-time sonographic guidance, the vessel was punctured with a 21 gauge micropuncture needle. Image was obtained and stored for the medical record.  With the aid of a 5 [place] transitional micro sheath, a working 7 [place] vascular sheath was advanced into the popliteal vein. A gentle an injection of contrast material confirmed stagnant flow and thrombus. And angled vertebral catheter was navigated through the thrombus and into the IVC. There appears to be a high-grade stenosis in all common/external iliac vein region. This was very difficult pass with a wire and catheter. A pigtail catheter was advanced over the wire and placed at the IVC bifurcation. An inferior vena cavagram was performed. There is stenosis in the common iliac vein as well as a small amount of thrombus. The left common iliac vein and IVC are patent. Extensive thrombus is noted in the external iliac and common femoral veins.  A total of 5 mg TPA diluted in 100 mL of saline was then Lasix throughout the thrombosed left lower extremity using the power pulse spray function of the Angiojet catheter. The TPA was allowed to dwell for 25 min. During this time,  balloon maceration was performed in the femoral and common femoral vein using and 8 x 40 mm Conquest balloon. On the first inflation, a small amount (2 mg) of tPA was injected through the sheath to reflux into the thrombosed calf veins.  Following the 25 min the well time, the Angiojet was then used from the origin of the common iliac vein to the level of the sheath in  the femoral vein. Two complete passes were performed. There is free aspiration of blood from the popliteal sheath. A contrast injection demonstrated complete patency of the femoral vein to the level of the lesser trochanter. There is some residual thrombus in the proximal femoral and common femoral veins. No definite thrombus in the external iliac vein. There is a high-grade flow limiting stenosis of the left common/external iliac vein at the sacral ala. No contrast material passes centrally. The pigtail catheter was readvanced into the IVC bifurcation. There is a small amount of persistent thrombus at the origin of the left internal iliac vein. The Angiojet was reintroduced and additional suction thrombectomy was performed. The thrombus was also macerated with a 10 x 40 mm Conquest balloon in the common femoral, external iliac and iliac veins. Hand injection of contrast material demonstrated improving patency of the stenosis. Decision was made to proceed with placement of a self expanding Nitinol stent to fully expand the stenotic region. After this, any residual thrombus can be addressed. A 14 by 80 mm self expanding Cordis control Nitinol stent was selected and deployed from a just within the IVC into the external iliac vein. The stent was post dilated to 12 mm using a 12 by 40 mm atlas balloon.  Post stent dilatation angioplasty demonstrated significant reaccumulation of thrombus in the external iliac vein and within the stent. Given the amount of Angiojet thrombectomy already performed, the decision was made to delay further mechanical  thrombectomy. 890 cm total length, 50 cm infusion length Unifuse infusion catheter was selected. The catheter was placed in so that the distal marker is at the origin of the left common iliac vein and the proximal marker is within the distal left femoral vein.  The catheter and sheath were secured to the skin with 0 silk suture. A sterile bandage was applied. Venous lysis was initiated at 0.5 milligrams/hour tNK. Heparinization will continue.  IMPRESSION: 1. 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 2. Initially successful combined of pharmacomechanical thrombolysis/thrombectomy procedure. 3. Angioplasty of femoral and common femoral veins to 8 mm. 4. Placement of a 14 mm left common iliac vein stent extending into the left external iliac vein. The stent was post dilated to 12 mm. 5. Unfortunately, significant reaccumulation of thrombus occurred during balloon dilatation of the stent. This may have been secondary to sub therapeutic heparinization. 6. Initiation of venous lysis at 0.5 mg tNK / hr through a multi side hole 50 cm infusion length infusion catheter extending from the origin of the left common iliac vein into the distal femoral vein. - Continue heparinization per pharmacy protocol  - Serial CBC and fibrinogen every 6 hr over night. Evaluate for evidence of bleeding complication.  - Aggressive hydration to flush kidneys as there will be hemoglobinuria.  - Patient to return Interventional Radiology tomorrow for lysis check and further intervention if required.  Signed,  Criselda Peaches, MD  [facility]  [facility]   Electronically Signed   By: Jacqulynn Cadet M.D.   On: 05/01/2013 16:52   Ir US Guide Vasc Access Left  05/01/2013   CLINICAL DATA:  38 year old female who presents to the emergency room with  acute (3 day) history of progressive and severe left lower extremity pain and swelling. She has a history of prior left lower extremity DVT at the end  of March 2015 as well as a history of a massive uterine fibroid status post hysterectomy on 04/13/2013 with removal of a 5 pound uterine fibroid. She was on outpatient prophylactic subcutaneous heparin (5000 units subcu TID) and doing well until Thursday when her left lower extremity pain and discomfort began. She had an outpatient appointment on Monday but reported to the emergency room today due to intractable left lower extremity pain. Left lower extremity for micro mechanical thrombolysis/ thrombectomy is indicated. 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 Given her history of surgery 3 weeks ago, I will attempt to perform a single session thrombolysis/thrombectomy. If this is not achievable, she will require over night lysis.  EXAM: THROMBECTOMY MECHANICAL VENOUS; IR INFUSION THROMBOL VENOUS INITIAL (MS); IR TRANSCATH PLC STENT INITIAL VEIN INC ANGIOPLASTY; PTA VENOUS; LEFT EXTREMITY VENOGRAPHY; INFERIOR VENA CAVOGRAM; IR ULTRASOUND GUIDANCE VASC ACCESS LEFT  Date: 05/01/2013  PROCEDURE: 1. Ultrasound-guided puncture of the left popliteal vein 2. Catheterization of the inferior vena cava with vena cavagram 3. Left lower extremity venogram 4. Pharmacomechanical thrombolysis/thrombectomy procedure 5. Angioplasty of femoral and common femoral vein to 8 mm 6. Angioplasty of external iliac vein to 10 mm 7. 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 Initiation of venous lysis for recurrent thrombus formation Interventional Radiologist:  Criselda Peaches, MD  ANESTHESIA/SEDATION: Moderate (conscious) sedation was used. Ten mg Versed, 400 mcg Fentanyl and 2 mg Dilaudid were administered  intravenously. The patient's vital signs were monitored continuously by radiology nursing throughout the procedure.  Sedation Time: 115 minutes  FLUOROSCOPY TIME:  33 min  CONTRAST:  158mL VISIPAQUE IODIXANOL 320 MG/ML IV SOLN  TECHNIQUE: Informed consent was obtained from the patient following explanation of the procedure, risks, benefits and alternatives. The patient understands, agrees and consents for the procedure. All questions were addressed. A time out was performed.  Maximal barrier sterile technique utilized including caps, mask, sterile gowns, sterile gloves, large sterile drape, hand hygiene, and Betadine skin prep.  The popliteal fossa was interrogated with ultrasound. The thrombosed popliteal vein was identified. Local anesthesia was attained by infiltration with 1% lidocaine. Under real-time sonographic guidance, the vessel was punctured with a 21 gauge micropuncture needle. Image was obtained and stored for the medical record.  With the aid of a 5 [place] transitional micro sheath, a working 7 [place] vascular sheath was advanced into the popliteal vein. A gentle an injection of contrast material confirmed stagnant flow and thrombus. And angled vertebral catheter was navigated through the thrombus and into the IVC. There appears to be a high-grade stenosis in all common/external iliac vein region. This was very difficult pass with a wire and catheter. A pigtail catheter was advanced over the wire and placed at the IVC bifurcation. An inferior vena cavagram was performed. There is stenosis in the common iliac vein as well as a small amount of thrombus. The left common iliac vein and IVC are patent. Extensive thrombus is noted in the external iliac and common femoral veins.  A total of 5 mg TPA diluted in 100 mL of saline was then Lasix throughout the thrombosed left lower extremity using the power pulse spray function of the Angiojet catheter. The TPA was allowed to dwell for 25 min. During this time,  balloon maceration was performed in the femoral and common femoral vein using and  8 x 40 mm Conquest balloon. On the first inflation, a small amount (2 mg) of tPA was injected through the sheath to reflux into the thrombosed calf veins.  Following the 25 min the well time, the Angiojet was then used from the origin of the common iliac vein to the level of the sheath in the femoral vein. Two complete passes were performed. There is free aspiration of blood from the popliteal sheath. A contrast injection demonstrated complete patency of the femoral vein to the level of the lesser trochanter. There is some residual thrombus in the proximal femoral and common femoral veins. No definite thrombus in the external iliac vein. There is a high-grade flow limiting stenosis of the left common/external iliac vein at the sacral ala. No contrast material passes centrally. The pigtail catheter was readvanced into the IVC bifurcation. There is a small amount of persistent thrombus at the origin of the left internal iliac vein. The Angiojet was reintroduced and additional suction thrombectomy was performed. The thrombus was also macerated with a 10 x 40 mm Conquest balloon in the common femoral, external iliac and iliac veins. Hand injection of contrast material demonstrated improving patency of the stenosis. Decision was made to proceed with placement of a self expanding Nitinol stent to fully expand the stenotic region. After this, any residual thrombus can be addressed. A 14 by 80 mm self expanding Cordis control Nitinol stent was selected and deployed from a just within the IVC into the external iliac vein. The stent was post dilated to 12 mm using a 12 by 40 mm atlas balloon.  Post stent dilatation angioplasty demonstrated significant reaccumulation of thrombus in the external iliac vein and within the stent. Given the amount of Angiojet thrombectomy already performed, the decision was made to delay further mechanical  thrombectomy. 890 cm total length, 50 cm infusion length Unifuse infusion catheter was selected. The catheter was placed in so that the distal marker is at the origin of the left common iliac vein and the proximal marker is within the distal left femoral vein.  The catheter and sheath were secured to the skin with 0 silk suture. A sterile bandage was applied. Venous lysis was initiated at 0.5 milligrams/hour tNK. Heparinization will continue.  IMPRESSION: 1. 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 2. Initially successful combined of pharmacomechanical thrombolysis/thrombectomy procedure. 3. Angioplasty of femoral and common femoral veins to 8 mm. 4. Placement of a 14 mm left common iliac vein stent extending into the left external iliac vein. The stent was post dilated to 12 mm. 5. Unfortunately, significant reaccumulation of thrombus occurred during balloon dilatation of the stent. This may have been secondary to sub therapeutic heparinization. 6. Initiation of venous lysis at 0.5 mg tNK / hr through a multi side hole 50 cm infusion length infusion catheter extending from the origin of the left common iliac vein into the distal femoral vein. - Continue heparinization per pharmacy protocol  - Serial CBC and fibrinogen every 6 hr over night. Evaluate for evidence of bleeding complication.  - Aggressive hydration to flush kidneys as there will be hemoglobinuria.  - Patient to return Interventional Radiology tomorrow for lysis check and further intervention if required.  Signed,  Criselda Peaches, MD  [facility]  [facility]   Electronically Signed   By: Dellis Filbert.D.  On: 05/01/2013 16:52   Ir [employer] Stent  Initial Vein  Inc Angioplasty  05/01/2013   CLINICAL DATA:  38 year old female who presents to  the emergency room with acute (3 day) history of progressive and severe left lower extremity pain and swelling. She has a history of prior left lower extremity DVT at the end of March 2015 as well as a history of a massive uterine fibroid status post hysterectomy on 04/13/2013 with removal of a 5 pound uterine fibroid. She was on outpatient prophylactic subcutaneous heparin (5000 units subcu TID) and doing well until Thursday when her left lower extremity pain and discomfort began. She had an outpatient appointment on Monday but reported to the emergency room today due to intractable left lower extremity pain. Left lower extremity for micro mechanical thrombolysis/ thrombectomy is indicated. 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 Given her history of surgery 3 weeks ago, I will attempt to perform a single session thrombolysis/thrombectomy. If this is not achievable, she will require over night lysis.  EXAM: THROMBECTOMY MECHANICAL VENOUS; IR INFUSION THROMBOL VENOUS INITIAL (MS); IR TRANSCATH PLC STENT INITIAL VEIN INC ANGIOPLASTY; PTA VENOUS; LEFT EXTREMITY VENOGRAPHY; INFERIOR VENA CAVOGRAM; IR ULTRASOUND GUIDANCE VASC ACCESS LEFT  Date: 05/01/2013  PROCEDURE: 1. Ultrasound-guided puncture of the left popliteal vein 2. Catheterization of the inferior vena cava with vena cavagram 3. Left lower extremity venogram 4. Pharmacomechanical thrombolysis/thrombectomy procedure 5. Angioplasty of femoral and common femoral vein to 8 mm 6. Angioplasty of external iliac vein to 10 mm 7. 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 Initiation of venous lysis for recurrent thrombus formation Interventional Radiologist:  Criselda Peaches, MD  ANESTHESIA/SEDATION: Moderate (conscious) sedation was used. Ten mg Versed, 400 mcg Fentanyl and 2 mg  Dilaudid were administered intravenously. The patient's vital signs were monitored continuously by radiology nursing throughout the procedure.  Sedation Time: 115 minutes  FLUOROSCOPY TIME:  33 min  CONTRAST:  159mL VISIPAQUE IODIXANOL 320 MG/ML IV SOLN  TECHNIQUE: Informed consent was obtained from the patient following explanation of the procedure, risks, benefits and alternatives. The patient understands, agrees and consents for the procedure. All questions were addressed. A time out was performed.  Maximal barrier sterile technique utilized including caps, mask, sterile gowns, sterile gloves, large sterile drape, hand hygiene, and Betadine skin prep.  The popliteal fossa was interrogated with ultrasound. The thrombosed popliteal vein was identified. Local anesthesia was attained by infiltration with 1% lidocaine. Under real-time sonographic guidance, the vessel was punctured with a 21 gauge micropuncture needle. Image was obtained and stored for the medical record.  With the aid of a 5 [place] transitional micro sheath, a working 7 [place] vascular sheath was advanced into the popliteal vein. A gentle an injection of contrast material confirmed stagnant flow and thrombus. And angled vertebral catheter was navigated through the thrombus and into the IVC. There appears to be a high-grade stenosis in all common/external iliac vein region. This was very difficult pass with a wire and catheter. A pigtail catheter was advanced over the wire and placed at the IVC bifurcation. An inferior vena cavagram was performed. There is stenosis in the common iliac vein as well as a small amount of thrombus. The left common iliac vein and IVC are patent. Extensive thrombus is noted in the external iliac and common femoral  veins.  A total of 5 mg TPA diluted in 100 mL of saline was then Lasix throughout the thrombosed left lower extremity using the power pulse spray function of the Angiojet catheter. The TPA was allowed to dwell for  25 min. During this time, balloon maceration was performed in the femoral and common femoral vein using and 8 x 40 mm Conquest balloon. On the first inflation, a small amount (2 mg) of tPA was injected through the sheath to reflux into the thrombosed calf veins.  Following the 25 min the well time, the Angiojet was then used from the origin of the common iliac vein to the level of the sheath in the femoral vein. Two complete passes were performed. There is free aspiration of blood from the popliteal sheath. A contrast injection demonstrated complete patency of the femoral vein to the level of the lesser trochanter. There is some residual thrombus in the proximal femoral and common femoral veins. No definite thrombus in the external iliac vein. There is a high-grade flow limiting stenosis of the left common/external iliac vein at the sacral ala. No contrast material passes centrally. The pigtail catheter was readvanced into the IVC bifurcation. There is a small amount of persistent thrombus at the origin of the left internal iliac vein. The Angiojet was reintroduced and additional suction thrombectomy was performed. The thrombus was also macerated with a 10 x 40 mm Conquest balloon in the common femoral, external iliac and iliac veins. Hand injection of contrast material demonstrated improving patency of the stenosis. Decision was made to proceed with placement of a self expanding Nitinol stent to fully expand the stenotic region. After this, any residual thrombus can be addressed. A 14 by 80 mm self expanding Cordis control Nitinol stent was selected and deployed from a just within the IVC into the external iliac vein. The stent was post dilated to 12 mm using a 12 by 40 mm atlas balloon.  Post stent dilatation angioplasty demonstrated significant reaccumulation of thrombus in the external iliac vein and within the stent. Given the amount of Angiojet thrombectomy already performed, the decision was made to delay  further mechanical thrombectomy. 890 cm total length, 50 cm infusion length Unifuse infusion catheter was selected. The catheter was placed in so that the distal marker is at the origin of the left common iliac vein and the proximal marker is within the distal left femoral vein.  The catheter and sheath were secured to the skin with 0 silk suture. A sterile bandage was applied. Venous lysis was initiated at 0.5 milligrams/hour tNK. Heparinization will continue.  IMPRESSION: 1. 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 2. Initially successful combined of pharmacomechanical thrombolysis/thrombectomy procedure. 3. Angioplasty of femoral and common femoral veins to 8 mm. 4. Placement of a 14 mm left common iliac vein stent extending into the left external iliac vein. The stent was post dilated to 12 mm. 5. Unfortunately, significant reaccumulation of thrombus occurred during balloon dilatation of the stent. This may have been secondary to sub therapeutic heparinization. 6. Initiation of venous lysis at 0.5 mg tNK / hr through a multi side hole 50 cm infusion length infusion catheter extending from the origin of the left common iliac vein into the distal femoral vein. - Continue heparinization per pharmacy protocol  - Serial CBC and fibrinogen every 6 hr over night. Evaluate for  evidence of bleeding complication.  - Aggressive hydration to flush kidneys as there will be hemoglobinuria.  - Patient to return Interventional Radiology tomorrow for lysis check and further intervention if required.  Signed,  Criselda Peaches, MD  [facility]  [facility]   Electronically Signed   By: Jacqulynn Cadet M.D.   On: 05/01/2013 16:52   Ir Infusion Thrombol Venous Initial (ms)  05/01/2013   CLINICAL DATA:  38 year old female who  presents to the emergency room with acute (3 day) history of progressive and severe left lower extremity pain and swelling. She has a history of prior left lower extremity DVT at the end of March 2015 as well as a history of a massive uterine fibroid status post hysterectomy on 04/13/2013 with removal of a 5 pound uterine fibroid. She was on outpatient prophylactic subcutaneous heparin (5000 units subcu TID) and doing well until Thursday when her left lower extremity pain and discomfort began. She had an outpatient appointment on Monday but reported to the emergency room today due to intractable left lower extremity pain. Left lower extremity for micro mechanical thrombolysis/ thrombectomy is indicated. 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 Given her history of surgery 3 weeks ago, I will attempt to perform a single session thrombolysis/thrombectomy. If this is not achievable, she will require over night lysis.  EXAM: THROMBECTOMY MECHANICAL VENOUS; IR INFUSION THROMBOL VENOUS INITIAL (MS); IR TRANSCATH PLC STENT INITIAL VEIN INC ANGIOPLASTY; PTA VENOUS; LEFT EXTREMITY VENOGRAPHY; INFERIOR VENA CAVOGRAM; IR ULTRASOUND GUIDANCE VASC ACCESS LEFT  Date: 05/01/2013  PROCEDURE: 1. Ultrasound-guided puncture of the left popliteal vein 2. Catheterization of the inferior vena cava with vena cavagram 3. Left lower extremity venogram 4. Pharmacomechanical thrombolysis/thrombectomy procedure 5. Angioplasty of femoral and common femoral vein to 8 mm 6. Angioplasty of external iliac vein to 10 mm 7. 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 Initiation of venous lysis for recurrent thrombus formation Interventional Radiologist:  Criselda Peaches, MD  ANESTHESIA/SEDATION: Moderate (conscious) sedation was used. Ten mg Versed, 400 mcg Fentanyl and  2 mg Dilaudid were administered intravenously. The patient's vital signs were monitored continuously by radiology nursing throughout the procedure.  Sedation Time: 115 minutes  FLUOROSCOPY TIME:  33 min  CONTRAST:  174mL VISIPAQUE IODIXANOL 320 MG/ML IV SOLN  TECHNIQUE: Informed consent was obtained from the patient following explanation of the procedure, risks, benefits and alternatives. The patient understands, agrees and consents for the procedure. All questions were addressed. A time out was performed.  Maximal barrier sterile technique utilized including caps, mask, sterile gowns, sterile gloves, large sterile drape, hand hygiene, and Betadine skin prep.  The popliteal fossa was interrogated with ultrasound. The thrombosed popliteal vein was identified. Local anesthesia was attained by infiltration with 1% lidocaine. Under real-time sonographic guidance, the vessel was punctured with a 21 gauge micropuncture needle. Image was obtained and stored for the medical record.  With the aid of a 5 [place] transitional micro sheath, a working 7 [place] vascular sheath was advanced into the popliteal vein. A gentle an injection of contrast material confirmed stagnant flow and thrombus. And angled vertebral catheter was navigated through the thrombus and into the IVC. There appears to be a high-grade stenosis in all common/external iliac vein region. This was very difficult pass with a wire and  catheter. A pigtail catheter was advanced over the wire and placed at the IVC bifurcation. An inferior vena cavagram was performed. There is stenosis in the common iliac vein as well as a small amount of thrombus. The left common iliac vein and IVC are patent. Extensive thrombus is noted in the external iliac and common femoral veins.  A total of 5 mg TPA diluted in 100 mL of saline was then Lasix throughout the thrombosed left lower extremity using the power pulse spray function of the Angiojet catheter. The TPA was allowed to dwell  for 25 min. During this time, balloon maceration was performed in the femoral and common femoral vein using and 8 x 40 mm Conquest balloon. On the first inflation, a small amount (2 mg) of tPA was injected through the sheath to reflux into the thrombosed calf veins.  Following the 25 min the well time, the Angiojet was then used from the origin of the common iliac vein to the level of the sheath in the femoral vein. Two complete passes were performed. There is free aspiration of blood from the popliteal sheath. A contrast injection demonstrated complete patency of the femoral vein to the level of the lesser trochanter. There is some residual thrombus in the proximal femoral and common femoral veins. No definite thrombus in the external iliac vein. There is a high-grade flow limiting stenosis of the left common/external iliac vein at the sacral ala. No contrast material passes centrally. The pigtail catheter was readvanced into the IVC bifurcation. There is a small amount of persistent thrombus at the origin of the left internal iliac vein. The Angiojet was reintroduced and additional suction thrombectomy was performed. The thrombus was also macerated with a 10 x 40 mm Conquest balloon in the common femoral, external iliac and iliac veins. Hand injection of contrast material demonstrated improving patency of the stenosis. Decision was made to proceed with placement of a self expanding Nitinol stent to fully expand the stenotic region. After this, any residual thrombus can be addressed. A 14 by 80 mm self expanding Cordis control Nitinol stent was selected and deployed from a just within the IVC into the external iliac vein. The stent was post dilated to 12 mm using a 12 by 40 mm atlas balloon.  Post stent dilatation angioplasty demonstrated significant reaccumulation of thrombus in the external iliac vein and within the stent. Given the amount of Angiojet thrombectomy already performed, the decision was made to delay  further mechanical thrombectomy. 890 cm total length, 50 cm infusion length Unifuse infusion catheter was selected. The catheter was placed in so that the distal marker is at the origin of the left common iliac vein and the proximal marker is within the distal left femoral vein.  The catheter and sheath were secured to the skin with 0 silk suture. A sterile bandage was applied. Venous lysis was initiated at 0.5 milligrams/hour tNK. Heparinization will continue.  IMPRESSION: 1. 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 2. Initially successful combined of pharmacomechanical thrombolysis/thrombectomy procedure. 3. Angioplasty of femoral and common femoral veins to 8 mm. 4. Placement of a 14 mm left common iliac vein stent extending into the left external iliac vein. The stent was post dilated to 12 mm. 5. Unfortunately, significant reaccumulation of thrombus occurred during balloon dilatation of the stent. This may have been secondary to sub therapeutic  heparinization. 6. Initiation of venous lysis at 0.5 mg tNK / hr through a multi side hole 50 cm infusion length infusion catheter extending from the origin of the left common iliac vein into the distal femoral vein. - Continue heparinization per pharmacy protocol  - Serial CBC and fibrinogen every 6 hr over night. Evaluate for evidence of bleeding complication.  - Aggressive hydration to flush kidneys as there will be hemoglobinuria.  - Patient to return Interventional Radiology tomorrow for lysis check and further intervention if required.  Signed,  Criselda Peaches, MD  [facility]  [facility] Radiology   Electronically Signed   By: Jacqulynn Cadet M.D.   On: 05/01/2013 16:52   Ir Jacolyn Reedy F/u Eval Art/ven Alwyn Ren Day (ms)  05/02/2013   CLINICAL DATA:  38 year old with history of  left lower extremity pain and swelling. Patient has a history of a left lower extremity DVT. Thrombectomy and thrombolytic therapy was performed on 05/01/2013. 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 Patient presents for follow-up venography following thrombolytic therapy.  EXAM: LEFT LOWER EXTREMITY AND PELVIC VENOGRAPHY TO FOLLOWUP THROMBOLYTIC THERAPY  Physician: Stephan Minister. Henn, MD  FLUOROSCOPY TIME:  2 min and 30 seconds  MEDICATIONS AND MEDICAL HISTORY: 2 mg versed, 100 mcg fentanyl. A radiology nurse monitored the patient for moderate sedation.  ANESTHESIA/SEDATION: Moderate sedation time: 20 min  CONTRAST:  50 mL Omnipaque-300  PROCEDURE: The patient was brought to the interventional suite. Patient was placed in a prone position. The existing catheter in the left popliteal fossa was prepped and draped in sterile fashion. Maximal barrier sterile technique was utilized including caps, mask, sterile gowns, sterile gloves, sterile drape, hand hygiene and skin antiseptic. Venography is performed through the 7 French vascular sheath. The infusion catheter was pulled back to the left groin and additional venography was performed. The infusion catheter was exchanged for a 5 French catheter that was advanced to the left common iliac vein stent. Additional venography was performed at this location. 5 French catheter was removed. The 7 French vascular sheath was removed with manual compression. Bandage placed over the puncture site.  FINDINGS: The left femoral vein is patent with a small amount of nonocclusive thrombus. Left common femoral vein is patent with minimal nonocclusive thrombus. There is mild narrowing at the junction of the left external iliac vein and left common femoral vein related to a valve. The left common iliac vein stent is patent. SVC is patent.  COMPLICATIONS: None  IMPRESSION: Resolution of the occlusive thrombus within the left thigh and left  pelvis. There is antegrade flow in the left lower extremity venous system. Thrombolytic therapy was stopped.   Electronically Signed   By: Markus Daft M.D.   On: 05/02/2013 16:08    Anti-infectives: Anti-infectives   None      Assessment/Plan: s/p completion of thrombolytic therapy for LLE DVT 4/20 - see above report for full details; cont IV heparin- upon d/c pt will need anticoagulation for 6 months, either with  SQ heparin that pt currently has or xarelto as per CCM- this clinical scenario is not result of SQ heparin failure but more so anatomical secondary to May Thurner syndrome; check am labs; pt will f/u with Dr. Laurence Ferrari in [place] [facility] in 1 month with LLE duplex US; ok to use TED hose or gentle ACE wrap on LLE in interim to assist with residual edema management.  LOS: 1 day  Crissie Sickles Kalman Nylen 05/02/2013

## 2013-05-02 NOTE — Progress Notes (Signed)
ANTICOAGULATION CONSULT NOTE - Follow Up Consult  Pharmacy Consult for Heparin  Indication: DVT, on TNKase drip   Patient Measurements: Height: 5\' 8"  (172.7 cm) Weight: 161 lb 7 oz (73.228 kg) IBW/kg (Calculated) : 63.9  Labs:  Recent Labs  05/01/13 0137 05/01/13 1610 05/01/13 1645 05/01/13 2214 05/01/13 2340 05/02/13 0357  HGB 10.0*  --  9.3* 8.8*  --  8.8*  HCT 31.3*  --  29.0* 27.2*  --  27.4*  PLT 584*  --  395 347  --  318  APTT 32  --   --   --   --   --   LABPROT 13.0  --   --   --   --   --   INR 1.00  --   --   --   --   --   HEPARINUNFRC  --  <0.10*  --   --  0.10* 0.14*  CREATININE 0.60  --   --   --   --  0.63    Estimated Creatinine Clearance: 98.1 ml/min (by C-G formula based on Cr of 0.63).  Medications:  Heparin 1000 units/hr  Assessment: 38 y/o F on heparin drip s/p IR for thrombolysis/thrombectomy for extensive DVT, remains on TNKase drip overnight, HL is 0.10, pt is having some abdominal pain and and with drop in Hgb to 8.8, CCM has ordered abdominal/pelvic U/S to look for hematoma, will continue to follow.   Goal of Therapy:  Heparin level 0.2-0.5 units/ml Monitor platelets by anticoagulation protocol: Yes   Plan:  -Increase heparin drip to 1150 units/hr -0700 HL -Daily CBC/HL -Monitor for bleeding, trend Hgb, f/u U/S results   Narda Bonds 05/02/2013,4:49 AM  Addendum  HL drawn at 0357 of 0.14 is only ~3 hours after rate change, continue at current rate and f/u with HL already scheduled for 0700 Sharlynn Oliphant, PharmD

## 2013-05-02 NOTE — Progress Notes (Addendum)
ANTICOAGULATION CONSULT NOTE - Follow Up Consult  Pharmacy Consult for Heparin  Indication: DVT, on TNKase drip   Patient Measurements: Height: 5\' 8"  (172.7 cm) Weight: 161 lb 9.6 oz (73.3 kg) IBW/kg (Calculated) : 63.9  Labs:  Recent Labs  05/01/13 0137  05/01/13 2214 05/01/13 2340 05/02/13 0357 05/02/13 1040 05/02/13 1045  HGB 10.0*  < > 8.8*  --  8.8* 9.4*  --   HCT 31.3*  < > 27.2*  --  27.4* 29.4*  --   PLT 584*  < > 347  --  318 335  --   APTT 32  --   --   --   --   --   --   LABPROT 13.0  --   --   --   --   --   --   INR 1.00  --   --   --   --   --   --   HEPARINUNFRC  --   < >  --  0.10* 0.14*  --  <0.10*  CREATININE 0.60  --   --   --  0.63  --   --   < > = values in this interval not displayed.  Estimated Creatinine Clearance: 98.1 ml/min (by C-G formula based on Cr of 0.63).  Medications:  Heparin 1000 units/hr  Assessment: 38 y/o F on heparin drip s/p IR for thrombolysis/thrombectomy for extensive DVT, remains on TNKase drip overnight, changes in heparin rate this am but HL still undetectable. Patient now in IR for re-examination. C/o abdominal pain earlier, [place] on order to rule out hematoma but pain now resolved.   Hgb improved to 9.4 this morning, no overt bleeding noted although nurse mentioned that blood appears very thin on lab draws. Will continue to follow closely after she returns from IR.   Goal of Therapy:  Heparin level 0.2-0.5 units/ml Monitor platelets by anticoagulation protocol: Yes   Plan:  -Increase heparin drip to 1500 units/hr -1800 HL -Daily CBC/HL -Monitor for bleeding, trend Hgb, f/u U/S results   Erin Hearing PharmD., BCPS Clinical Pharmacist Pager [phone] 05/02/2013 1:10 PM  Addendum: Heparin was stopped during IR procedure. Orders to restart heparin at 1430, given multiple rate changes and procedures will restart at lower rate and recheck level tonight. TNKase was stopped and there are no plans to  restart.  Plan: Restart heparin at 1300 units/hr @1430  6h HL  05/02/2013 1:24 PM

## 2013-05-02 NOTE — Progress Notes (Signed)
ANTICOAGULATION CONSULT NOTE - Follow Up Consult    HL = 0.21 (goal 0.3 - 0.5 units/mL) Heparin dosing weight = 73 kg   Assessment: 32 YOF with DVT s/p thrombectomy and TNKase to continue on IV heparin.  Heparin level sub-therapeutic.  No bleeding nor complications reported.   Plan: - Increase heparin gtt to 1450 units/hr - Check 6 hr HL    Filipe Greathouse D. Mina Marble, PharmD, BCPS Pager:  [phone] 05/02/2013, 9:08 PM

## 2013-05-02 NOTE — Progress Notes (Signed)
ANTICOAGULATION CONSULT NOTE - Follow Up Consult  Pharmacy Consult for Heparin  Indication: DVT, on TNKase drip   Patient Measurements: Height: 5\' 8"  (172.7 cm) Weight: 161 lb 7 oz (73.228 kg) IBW/kg (Calculated) : 63.9  Labs:  Recent Labs  05/01/13 0137 05/01/13 1610 05/01/13 1645 05/01/13 2214 05/01/13 2340  HGB 10.0*  --  9.3* 8.8*  --   HCT 31.3*  --  29.0* 27.2*  --   PLT 584*  --  395 347  --   APTT 32  --   --   --   --   LABPROT 13.0  --   --   --   --   INR 1.00  --   --   --   --   HEPARINUNFRC  --  <0.10*  --   --  0.10*  CREATININE 0.60  --   --   --   --     Estimated Creatinine Clearance: 98.1 ml/min (by C-G formula based on Cr of 0.6).  Medications:  Heparin 1000 units/hr  Assessment: 38 y/o F on heparin drip s/p IR for thrombolysis/thrombectomy for extensive DVT, remains on TNKase drip overnight, HL is 0.10, pt is having some abdominal pain and and with drop in Hgb to 8.8, CCM has ordered abdominal/pelvic U/S to look for hematoma, will continue to follow.   Goal of Therapy:  Heparin level 0.2-0.5 units/ml Monitor platelets by anticoagulation protocol: Yes   Plan:  -Increase heparin drip to 1150 units/hr -0700 HL -Daily CBC/HL -Monitor for bleeding, trend Hgb, f/u U/S results   Narda Bonds 05/02/2013,12:42 AM

## 2013-05-02 NOTE — Progress Notes (Addendum)
PULMONARY / CRITICAL CARE MEDICINE   Name: Linda Dalton MRN: [id number] DOB: Jun 23, 1975    ADMISSION DATE:  05/01/2013 CONSULTATION DATE: 05/01/13 REFERRING MD : EDP   PRIMARY SERVICE: PCCM   CHIEF COMPLAINT:  Extensive Left DVT -for IR -TPA   BRIEF PATIENT DESCRIPTION:   38 yo AAF was on "hormone study drug " 6 weeks ago for fibroid. She had a DVT on Left and was treated with Lovenox prior to hysterectomy for fibroid on 04/14/13 . Lovenox was switched to sq heparin daily after surgery, but her leg swelling did not get better after surgery and last couple of days, pain worse in left groin. The patient presented to ER with increased left leg swelling/pain--> found to have extensive LLE DVT-->had IR cath directed TPA.     SIGNIFICANT EVENTS / STUDIES:   -- Venous doppler 4/19 >extensive acute DVT left post tib/popliteal/common ferm veins--had IR cath directed TPA. --[place] abdo 4/20>>>  LINES / TUBES: -- PIV 4/19>>>  CULTURES: none  ANTIBIOTICS: None  SUBJECTIVE:  4/20: The patient left leg pain improving, but has whole body aching. No chest pain or SOB. No bleeding.  No belly pain  VITAL SIGNS: Temp:  [97 F (36.1 C)-98.9 F (37.2 C)] 97 F (36.1 C) (04/20 0400) Pulse Rate:  [58-94] 74 (04/20 0600) Resp:  [16-33] 19 (04/20 0600) BP: (100-143)/(64-95) 108/64 mmHg (04/20 0600) SpO2:  [94 %-100 %] 98 % (04/20 0600) Weight:  [161 lb 9.6 oz (73.3 kg)] 161 lb 9.6 oz (73.3 kg) (04/20 0500) HEMODYNAMICS:   VENTILATOR SETTINGS:   INTAKE / OUTPUT: Intake/Output     04/19 0701 - 04/20 0700 04/20 0701 - 04/21 0700   P.O. 160    I.V. (mL/kg) 2377.6 (32.4)    IV Piggyback 695.8    Total Intake(mL/kg) 3233.4 (44.1)    Urine (mL/kg/hr) 900 (0.5)    Emesis/NG output 600 (0.3)    Total Output 1500     Net +1733.4            PHYSICAL EXAMINATION:  GEN: A/Ox3; pleasant , NAD HEENT: neck soft, no JVD, No thyromegaly Lung: CTAB CARD:  RRR, no m/r/g Abd:    Soft & nt; nml  bowel sounds; no organomegaly or masses detected.  Extremities: left leg swelling on left , pulses intact, no cyanosis or clubbing.  Neuro: alert, no focal deficits Skin: Warm, no lesions or rashes  LABS:  CBC  Recent Labs Lab 05/01/13 1645 05/01/13 2214 05/02/13 0357  WBC 11.3* 10.2 8.5  HGB 9.3* 8.8* 8.8*  HCT 29.0* 27.2* 27.4*  PLT 395 347 318   Coag's  Recent Labs Lab 05/01/13 0137  APTT 32  INR 1.00   BMET  Recent Labs Lab 05/01/13 0137 05/02/13 0357  NA 138 140  K 3.8 3.8  CL 100 106  CO2 23 23  BUN 12 14  CREATININE 0.60 0.63  GLUCOSE 100* 100*   Electrolytes  Recent Labs Lab 05/01/13 0137 05/02/13 0357  CALCIUM 9.3 8.1*   Sepsis Markers No results found for this basename: LATICACIDVEN, PROCALCITON, O2SATVEN,  in the last 168 hours ABG No results found for this basename: PHART, PCO2ART, PO2ART,  in the last 168 hours Liver Enzymes  Recent Labs Lab 05/01/13 0137  AST 25  ALT 45*  ALKPHOS 241*  BILITOT 0.3  ALBUMIN 3.0*   Cardiac Enzymes  Recent Labs Lab 05/01/13 1610  PROBNP 6.2   Glucose No results found for this basename: GLUCAP,  in  the last 168 hours  Imaging Reviewed  ASSESSMENT / PLAN:  PULMONARY A:  - At risk for PE d/t extensive DVT on admission-->No signs of PE w/ no dyspnea/chest pain/hypoxia  - CXR negative P:   Monitor closely   Pending echo  CARDIOVASCULAR A: Acute left extensive DVT-->s/p of thrombolytic therapy, placement of stent across left common and external iliac veno stenosis  P:   Hep Drip   echo on order IR following, back to IR today  RENAL A:  Nml renal fxn   P:   - Monitor and replete electrolytes closely - IV fluid  -dc foley asap, after catheter out  GASTROINTESTINAL A:  reported resolved abdo pain, at risk hematoma P:    - monitor closely Diet after IR US on order, consider CT if reoccurs, now resolved   HEMATOLOGIC A:  - Acute extensive Left DVT (anatomical vein kink,  post recent hormone study drug /hysterectomy)   - Anemia (recent post op ). Hgb 8.5 in AM  P:  On hep drip   Monitor cbc closely -post TPA   Pain meds as needed   Consider oral anticoagulant in PM , if stent open and Ir drain removed  INFECTIOUS A:  No active issues   P:    Tr wbc and temp    ENDOCRINE A:  No active issues   P:   Monitor   NEUROLOGIC A:  S/o tnk, low risk ICH   P:   Monitor neuro per protocol IR Any changes dc anticoagulation and stat ct head, cryo   TODAY'S SUMMARY: to IR, then diet and transfer, follow abdo pain that has resolved  Pulmonary and [place] Pager: [phone]  05/02/2013, 7:28 AM  I have fully examined this patient and agree with above findings.      Lavon Paganini. Titus Mould, MD, [place] Pgr: [facility]

## 2013-05-02 NOTE — Progress Notes (Addendum)
Dr. Laurence Ferrari notified of labs and  ptient's complaint of Abd pain. No new orders. Will continue with treatment plan. Patient medicated for pain and awaiting abd ultrasound. Vital signs stable.

## 2013-05-02 NOTE — Procedures (Signed)
Follow-up thrombolysis venography was performed.  The left femoral vein and left iliac veins are patent without occlusive clot.  Left iliac stent and IVC are patent.  The thrombolytics and heparin were stopped and the left popliteal sheath was removed with manual compression.  No immediate complication.  Plan to re-start anticoagulation this afternoon.

## 2013-05-03 ENCOUNTER — Other Ambulatory Visit: Payer: Self-pay | Admitting: Radiology

## 2013-05-03 DIAGNOSIS — I749 Embolism and thrombosis of unspecified artery: Secondary | ICD-10-CM

## 2013-05-03 DIAGNOSIS — I82409 Acute embolism and thrombosis of unspecified deep veins of unspecified lower extremity: Secondary | ICD-10-CM

## 2013-05-03 LAB — COMPREHENSIVE METABOLIC PANEL
ALK PHOS: 183 U/L — AB (ref 39–117)
ALT: 22 U/L (ref 0–35)
AST: 18 U/L (ref 0–37)
Albumin: 2.2 g/dL — ABNORMAL LOW (ref 3.5–5.2)
BUN: 5 mg/dL — ABNORMAL LOW (ref 6–23)
CALCIUM: 7.9 mg/dL — AB (ref 8.4–10.5)
CO2: 24 mEq/L (ref 19–32)
CREATININE: 0.49 mg/dL — AB (ref 0.50–1.10)
Chloride: 103 mEq/L (ref 96–112)
GFR calc non Af Amer: >90 mL/min (ref >90)
GLUCOSE: 95 mg/dL (ref 70–99)
Potassium: 3.1 mEq/L — ABNORMAL LOW (ref 3.7–5.3)
Sodium: 139 mEq/L (ref 137–147)
TOTAL PROTEIN: 6.3 g/dL (ref 6.0–8.3)
Total Bilirubin: 0.4 mg/dL (ref 0.3–1.2)

## 2013-05-03 LAB — PROTIME-INR
INR: 1.13 (ref 0.00–1.49)
Prothrombin Time: 14.3 seconds (ref 11.6–15.2)

## 2013-05-03 LAB — HEPARIN LEVEL (UNFRACTIONATED): Heparin Unfractionated: 0.14 IU/mL — ABNORMAL LOW (ref 0.30–0.70)

## 2013-05-03 LAB — CBC
HEMATOCRIT: 25.8 % — AB (ref 36.0–46.0)
HEMOGLOBIN: 8.3 g/dL — AB (ref 12.0–15.0)
MCH: 26.3 pg (ref 26.0–34.0)
MCHC: 32.2 g/dL (ref 30.0–36.0)
MCV: 81.6 fL (ref 78.0–100.0)
Platelets: 300 10*3/uL (ref 150–400)
RBC: 3.16 MIL/uL — ABNORMAL LOW (ref 3.87–5.11)
RDW: 14.6 % (ref 11.5–15.5)
WBC: 7.5 10*3/uL (ref 4.0–10.5)

## 2013-05-03 LAB — APTT: APTT: 59 s — AB (ref 24–37)

## 2013-05-03 MED ORDER — POTASSIUM CHLORIDE CRYS ER 20 MEQ PO TBCR
30.0000 meq | EXTENDED_RELEASE_TABLET | ORAL | Status: AC
  Administered 2013-05-03 (×2): 30 meq via ORAL
  Filled 2013-05-03 (×4): qty 1

## 2013-05-03 MED ORDER — RIVAROXABAN (XARELTO) EDUCATION KIT FOR DVT/PE PATIENTS
PACK | Freq: Once | Status: AC
  Administered 2013-05-03: 14:00:00
  Filled 2013-05-03: qty 1

## 2013-05-03 MED ORDER — WHITE PETROLATUM GEL
Status: AC
  Administered 2013-05-03: 21:00:00
  Filled 2013-05-03: qty 5

## 2013-05-03 MED ORDER — RIVAROXABAN 15 MG PO TABS
15.0000 mg | ORAL_TABLET | Freq: Two times a day (BID) | ORAL | Status: DC
  Administered 2013-05-03 – 2013-05-04 (×2): 15 mg via ORAL
  Filled 2013-05-03 (×6): qty 1

## 2013-05-03 MED ORDER — ACETAMINOPHEN 325 MG PO TABS
650.0000 mg | ORAL_TABLET | Freq: Four times a day (QID) | ORAL | Status: DC | PRN
  Administered 2013-05-03: 650 mg via ORAL
  Filled 2013-05-03: qty 2

## 2013-05-03 NOTE — Progress Notes (Signed)
PULMONARY / CRITICAL CARE MEDICINE   Name: Linda Dalton MRN: [id number] DOB: September 19, 1975    ADMISSION DATE:  05/01/2013 CONSULTATION DATE: 05/01/13 REFERRING MD : EDP   PRIMARY SERVICE: PCCM   CHIEF COMPLAINT:  Extensive Left DVT -for IR -TPA   BRIEF PATIENT DESCRIPTION:   38 yo AAF was on "hormone study drug " 6 weeks ago for fibroid. She had a DVT on Left and was treated with Lovenox prior to hysterectomy for fibroid on 04/14/13 . Lovenox was switched to sq heparin daily after surgery, but her leg swelling did not get better after surgery and last couple of days, pain worse in left groin. The patient presented to ER with increased left leg swelling/pain--> found to have extensive LLE DVT-->had IR cath directed TPA.     SIGNIFICANT EVENTS / STUDIES:   -- Venous doppler 4/19 >extensive acute DVT left post tib/popliteal/common ferm veins--had IR cath directed TPA. --[place] abdo 4/20>>>cancelled no abdo pain  LINES / TUBES: -- PIV 4/19>>>  CULTURES: none  ANTIBIOTICS: None  SUBJECTIVE:  Uneventful evening  VITAL SIGNS: Temp:  [98.1 F (36.7 C)-99.5 F (37.5 C)] 99 F (37.2 C) (04/21 0400) Pulse Rate:  [82-105] 102 (04/20 1255) Resp:  [17-29] 17 (04/21 0600) BP: (90-120)/(49-79) 96/55 mmHg (04/21 0400) SpO2:  [97 %-100 %] 98 % (04/21 0400) Weight:  [75.1 kg (165 lb 9.1 oz)] 75.1 kg (165 lb 9.1 oz) (04/21 0448) HEMODYNAMICS:   VENTILATOR SETTINGS:   INTAKE / OUTPUT: Intake/Output     04/20 0701 - 04/21 0700 04/21 0701 - 04/22 0700   P.O. 360    I.V. (mL/kg) 1665.2 (22.2)    IV Piggyback 287.5    Total Intake(mL/kg) 2312.7 (30.8)    Urine (mL/kg/hr) 2100 (1.2)    Emesis/NG output     Total Output 2100     Net +212.7            PHYSICAL EXAMINATION:  GEN: A/Ox3; pleasant , NAD HEENT: neck soft, no JVD Lung: CTAB CARD:  RRR, no m/r/g Abd:    Soft nontender exam, no r/g, bs wnl Extremities: left leg swelling on left unchanged Neuro: alert, no focal  deficits Skin: Warm, no lesions or rashes  LABS:  CBC  Recent Labs Lab 05/02/13 1040 05/02/13 1544 05/03/13 0400  WBC 6.9 7.3 7.5  HGB 9.4* 8.7* 8.3*  HCT 29.4* 27.2* 25.8*  PLT 335 306 300   Coag's  Recent Labs Lab 05/01/13 0137 05/03/13 0400  APTT 32 59*  INR 1.00 1.13   BMET  Recent Labs Lab 05/01/13 0137 05/02/13 0357 05/03/13 0400  NA 138 140 139  K 3.8 3.8 3.1*  CL 100 106 103  CO2 23 23 24   BUN 12 14 5*  CREATININE 0.60 0.63 0.49*  GLUCOSE 100* 100* 95   Electrolytes  Recent Labs Lab 05/01/13 0137 05/02/13 0357 05/03/13 0400  CALCIUM 9.3 8.1* 7.9*   Sepsis Markers No results found for this basename: LATICACIDVEN, PROCALCITON, O2SATVEN,  in the last 168 hours ABG No results found for this basename: PHART, PCO2ART, PO2ART,  in the last 168 hours Liver Enzymes  Recent Labs Lab 05/01/13 0137 05/03/13 0400  AST 25 18  ALT 45* 22  ALKPHOS 241* 183*  BILITOT 0.3 0.4  ALBUMIN 3.0* 2.2*   Cardiac Enzymes  Recent Labs Lab 05/01/13 1610  PROBNP 6.2   Glucose No results found for this basename: GLUCAP,  in the last 168 hours  Imaging Reviewed  ASSESSMENT /  PLAN:  PULMONARY A:  - At risk for PE d/t extensive DVT on admission-->No signs of PE w/ no dyspnea/chest pain/hypoxia  - CXR negative P:   Monitor closely  O2 sats Pending echo, wil re order for pa presures  CARDIOVASCULAR A: Acute left extensive DVT-->s/p of thrombolytic therapy, 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 Hgb 8.3in AM  Vaginal spotting P:  On hep drip   Monitor cbc closely again in am Consider oral anticoagulant today Follow vag spotting  INFECTIOUS A:  No active issues   P:    Tr wbc and temp    ENDOCRINE A:  No active issues   P:   Monitor   NEUROLOGIC A:  pain   P:   Controlled Ambulation per IR   TODAY'S SUMMARY: to floor, replace k, rechck lytes in am , need oral anticoagualnt per IR  Pulmonary and [place] Pager: [phone]  05/03/2013, 7:32 AM  I have fully examined this patient and agree with above findings.      Lavon Paganini. Titus Mould, MD, [place] Pgr: [place] [facility]

## 2013-05-03 NOTE — Progress Notes (Signed)
ANTICOAGULATION CONSULT NOTE - Follow Up Consult  Pharmacy Consult for Heparin  Indication: DVT, on TNKase drip   Patient Measurements: Height: 5\' 8"  (172.7 cm) Weight: 165 lb 9.1 oz (75.1 kg) IBW/kg (Calculated) : 63.9  Labs:  Recent Labs  05/01/13 0137  05/02/13 0357 05/02/13 1040 05/02/13 1045 05/02/13 1544 05/02/13 2030 05/03/13 0400  HGB 10.0*  < > 8.8* 9.4*  --  8.7*  --  8.3*  HCT 31.3*  < > 27.4* 29.4*  --  27.2*  --  25.8*  PLT 584*  < > 318 335  --  306  --  300  APTT 32  --   --   --   --   --   --  59*  LABPROT 13.0  --   --   --   --   --   --  14.3  INR 1.00  --   --   --   --   --   --  1.13  HEPARINUNFRC  --   < > 0.14*  --  <0.10*  --  0.21* 0.14*  CREATININE 0.60  --  0.63  --   --   --   --  0.49*  < > = values in this interval not displayed.  Estimated Creatinine Clearance: 98.1 ml/min (by C-G formula based on Cr of 0.49).  Medications:  Heparin 1000 units/hr  Assessment: 38 y/o F on heparin drip s/p IR for thrombolysis/thrombectomy for extensive DVT.   Patient stable overnight without bleeding complications. She is currently off TNKase. Hgb 8.3 low but stable considering surgeries yesterday. Orders from CCM this afternoon to transition to xarelto.  Will turn off heparin tonight and start xarelto with evening meal - bid x 21 days then 20mg  daily.   Patient made aware of plan and educated on xarelto, copay cards given to patient.  Goal of Therapy:  Monitor platelets by anticoagulation protocol: Yes   Plan:  -Stop heparin at 1700 when xarelto given Xarelto 15mg  bid x21d then 20mg  daily -Monitor for bleeding, trend Hgb, f/u U/S results   Erin Hearing PharmD., BCPS Clinical Pharmacist Pager [phone] 05/03/2013 7:20 AM

## 2013-05-03 NOTE — Progress Notes (Signed)
[facility] ADULT ICU REPLACEMENT PROTOCOL FOR AM LAB REPLACEMENT ONLY  The patient does apply for the [facility] Adult ICU Electrolyte Replacment Protocol based on the criteria listed below:   1. Is GFR >/= 40 ml/min? yes  Patient's GFR today is >90 2. Is urine output >/= 0.5 ml/kg/hr for the last 6 hours? yes Patient's UOP is 2.5 ml/kg/hr 3. Is BUN < 60 mg/dL? yes  Patient's BUN today is 5 4. Abnormal electrolyte K 3.1 5. Ordered repletion with: per protocol 6. If a panic level lab has been reported, has the CCM MD in charge been notified? yes.   Physician:  Lutricia Feil Zayah Keilman 05/03/2013 5:33 AM

## 2013-05-03 NOTE — Discharge Instructions (Signed)
Information on my medicine - XARELTO (rivaroxaban)  This medication education was reviewed with me or my healthcare representative as part of my discharge preparation.  The pharmacist that spoke with me during my hospital stay was:  Georgina Peer, [place]? Xarelto was prescribed to treat blood clots that may have been found in the veins of your legs (deep vein thrombosis) or in your lungs (pulmonary embolism) and to reduce the risk of them occurring again.  What do you need to know about Xarelto? The starting dose is one 15 mg tablet taken TWICE daily with food for the FIRST 21 DAYS then on (enter date) 05/25/13  the dose is changed to one 20 mg tablet taken ONCE A DAY with your evening meal.  DO NOT stop taking Xarelto without talking to the health care provider who prescribed the medication.  Refill your prescription for 20 mg tablets before you run out.  After discharge, you should have regular check-up appointments with your healthcare provider that is prescribing your Xarelto.  In the future your dose may need to be changed if your kidney function changes by a significant amount.  What do you do if you miss a dose? If you are taking Xarelto TWICE DAILY and you miss a dose, take it as soon as you remember. You may take two 15 mg tablets (total 30 mg) at the same time then resume your regularly scheduled 15 mg twice daily the next day.  If you are taking Xarelto ONCE DAILY and you miss a dose, take it as soon as you remember on the same day then continue your regularly scheduled once daily regimen the next day. Do not take two doses of Xarelto at the same time.   Important Safety Information Xarelto is a blood thinner medicine that can cause bleeding. You should call your healthcare provider right away if you experience any of the following:   Bleeding from an injury or your nose that does not stop.   Unusual colored urine (red or dark brown) or  unusual colored stools (red or black).   Unusual bruising for unknown reasons.   A serious fall or if you hit your head (even if there is no bleeding).  Some medicines may interact with Xarelto and might increase your risk of bleeding while on Xarelto. To help avoid this, consult your healthcare provider or pharmacist prior to using any new prescription or non-prescription medications, including herbals, vitamins, non-steroidal anti-inflammatory drugs (NSAIDs) and supplements.  This website has more information on Xarelto: [email or web address].[email or web address]

## 2013-05-03 NOTE — Progress Notes (Signed)
Echocardiogram 2D Echocardiogram has been performed.  Linda Dalton Linda Dalton 05/03/2013, 11:58 AM

## 2013-05-03 NOTE — Progress Notes (Addendum)
ANTICOAGULATION CONSULT NOTE - Follow Up Consult  Pharmacy Consult for Heparin  Indication: DVT, OFF TNKase drip   Patient Measurements: Height: 5\' 8"  (172.7 cm) Weight: 165 lb 9.1 oz (75.1 kg) IBW/kg (Calculated) : 63.9  Labs:  Recent Labs  05/01/13 0137  05/02/13 0357 05/02/13 1040 05/02/13 1045 05/02/13 1544 05/02/13 2030 05/03/13 0400  HGB 10.0*  < > 8.8* 9.4*  --  8.7*  --  8.3*  HCT 31.3*  < > 27.4* 29.4*  --  27.2*  --  25.8*  PLT 584*  < > 318 335  --  306  --  300  APTT 32  --   --   --   --   --   --  59*  LABPROT 13.0  --   --   --   --   --   --  14.3  INR 1.00  --   --   --   --   --   --  1.13  HEPARINUNFRC  --   < > 0.14*  --  <0.10*  --  0.21* 0.14*  CREATININE 0.60  --  0.63  --   --   --   --  0.49*  < > = values in this interval not displayed.  Estimated Creatinine Clearance: 98.1 ml/min (by C-G formula based on Cr of 0.49).  Medications:  Heparin 1450 units/hr  Assessment: 38 y/o F on heparin drip s/p IR for thrombolysis/thrombectomy for extensive DVT, TNKase drip OFF at 1155 on 4/20. HL is 0.14, Hgb 8.3, other labs as above. Can likely start using normal heparin goal later today after >24 hours off TNKase.   Goal of Therapy:  Heparin level 0.2-0.5 units/ml Monitor platelets by anticoagulation protocol: Yes   Plan:  -Increase heparin drip to 1650 units/hr -1300 HL -Daily CBC/HL -Monitor for bleeding, trend Hgb, f/u U/S results   Narda Bonds 05/03/2013,6:23 AM

## 2013-05-04 ENCOUNTER — Encounter: Payer: Self-pay | Admitting: Adult Health

## 2013-05-04 ENCOUNTER — Inpatient Hospital Stay (HOSPITAL_COMMUNITY): Payer: Medicaid Other

## 2013-05-04 DIAGNOSIS — I82409 Acute embolism and thrombosis of unspecified deep veins of unspecified lower extremity: Secondary | ICD-10-CM

## 2013-05-04 LAB — CBC
HCT: 25.6 % — ABNORMAL LOW (ref 36.0–46.0)
Hemoglobin: 8.3 g/dL — ABNORMAL LOW (ref 12.0–15.0)
MCH: 26.7 pg (ref 26.0–34.0)
MCHC: 32.4 g/dL (ref 30.0–36.0)
MCV: 82.3 fL (ref 78.0–100.0)
Platelets: 300 10*3/uL (ref 150–400)
RBC: 3.11 MIL/uL — ABNORMAL LOW (ref 3.87–5.11)
RDW: 14.9 % (ref 11.5–15.5)
WBC: 5 10*3/uL (ref 4.0–10.5)

## 2013-05-04 LAB — PHOSPHORUS: Phosphorus: 3.9 mg/dL (ref 2.3–4.6)

## 2013-05-04 LAB — BASIC METABOLIC PANEL
BUN: 7 mg/dL (ref 6–23)
CO2: 23 mEq/L (ref 19–32)
CREATININE: 0.53 mg/dL (ref 0.50–1.10)
Calcium: 8.4 mg/dL (ref 8.4–10.5)
Chloride: 105 mEq/L (ref 96–112)
GFR calc non Af Amer: >90 mL/min (ref >90)
Glucose, Bld: 101 mg/dL — ABNORMAL HIGH (ref 70–99)
Potassium: 4 mEq/L (ref 3.7–5.3)
Sodium: 139 mEq/L (ref 137–147)

## 2013-05-04 LAB — MAGNESIUM: Magnesium: 2 mg/dL (ref 1.5–2.5)

## 2013-05-04 MED ORDER — RIVAROXABAN (XARELTO) VTE STARTER PACK (15 & 20 MG): ORAL_TABLET | ORAL | Status: DC

## 2013-05-04 NOTE — Progress Notes (Signed)
Pt is being discharged home. Pt has been provided with discharge instructions. RN answered all questions the patient had

## 2013-05-04 NOTE — Discharge Summary (Signed)
Physician Discharge Summary  Patient ID: Linda Dalton MRN: [id number] DOB/AGE: 21-Jun-1975 38 y.o.  Admit date: 05/01/2013 Discharge date: 05/04/2013    Discharge Diagnoses:  Principal Problem:   DVT (deep venous thrombosis) Active Problems:   Acute DVT (deep venous thrombosis)                                                              D/C Plan by Discharge Diagnosis  Acute left extensive DVT-->s/p thrombolytic therapy.  In setting hormone therapy and active smoker.  Anemia (recent pos op- hysterectomy 4/2)  D/c plan --  Home on xarelto 31m bid x21d then 274mdaily outpt f/u with pulm/ccm NP  F/u cbc and bmet as outpt  Suspect will need anticoagulation x 3-6 months  Will need PCP SMOKING CESSATION  Counseled extensively on s/s that warrant return  S/p hysterectomy 4/2 D/c plan -  Monitor vaginal spotting  GYN f/u as prev scheduled    Brief Summary: Linda POMPLUNs a [age].o. y/o female active smoker with a PMH of asthma and fibroid tumor who was on "hormone study drug" 6 weeks prior to admit for uterine fibroid.  She had previous DVT on L and was rx with lovenox prior to hysterectomy 04/14/13.  She presented 4/19 to ER with increased LLE swelling and pain and found to have extensive LLE DVT.  Started on heparin gtt and taken to IR for thrombolysis/thrombectomy with sheath remaninig in place for TNKase infusion.  F/u venography revealed patent veins and sheat removed/TNKase stopped.  She remained on heparin gtt and was transitioned to PO xarelto 4/21.  2D echo revealed normal PA pressures and systolic function.  She is much improved, on RA and remains without any s/s PE (no dyspnea, chest pain, hypoxia).  She is currently awake, alert, walking in room, denies chest pain, SOB, abd pain, lightheadedness.  She has transitioned well from heparin to xarelto and is ready for d/c with close outpt f/u by PCP and GYN.    SIGNIFICANT EVENTS / STUDIES:  Venous doppler 4/19  >extensive acute DVT left post tib/popliteal/common ferm veins--had IR cath directed TPA.  4/19>>> IR thrombolysis/thrombectomy of LLE DVT 2D echo 4/21>>> EF 5535-45%normal systolic function, trivial TR, normal PA press  LINES / TUBES:  LLE sheath 4/19>>>4/20  CULTURES:  none   ANTIBIOTICS:  None    Filed Vitals:   05/03/13 1100 05/03/13 1553 05/03/13 2333 05/04/13 0453  BP:  115/54 102/64 112/68  Pulse:  92 79 81  Temp: 98.7 F (37.1 C) 98.7 F (37.1 C) 97.9 F (36.6 C) 98.3 F (36.8 C)  TempSrc:  Oral Oral Oral  Resp: 17 17 16 16   Height:      Weight:    166 lb 10.7 oz (75.6 kg)  SpO2: 99% 100% 99% 100%     Discharge Labs  BMET  Recent Labs Lab 05/01/13 0137 05/02/13 0357 05/03/13 0400 05/04/13 0416  NA 138 140 139 139  K 3.8 3.8 3.1* 4.0  CL 100 106 103 105  CO2 23 23 24 23   GLUCOSE 100* 100* 95 101*  BUN 12 14 5* 7  CREATININE 0.60 0.63 0.49* 0.53  CALCIUM 9.3 8.1* 7.9* 8.4  MG  --   --   --  2.0  PHOS  --   --   --  3.9     CBC   Recent Labs Lab 05/02/13 1544 05/03/13 0400 05/04/13 0416  HGB 8.7* 8.3* 8.3*  HCT 27.2* 25.8* 25.6*  WBC 7.3 7.5 5.0  PLT 306 300 300   Anti-Coagulation  Recent Labs Lab 05/01/13 0137 05/03/13 0400  INR 1.00 1.13       Future Appointments Provider Department Dept Phone   05/12/2013 9:00 AM Melvenia Needles, NP [facility] [phone]           Follow-up Information   Follow up with PARRETT,TAMMY, NP On 05/12/2013. (9:00am with labs )    Specialty:  Nurse Practitioner   Contact information:   [address] [place] [address] [phone]       Follow up with Hale Drone, MD On 05/13/2013.   Specialty:  Unknown Physician Specialty   Contact information:   [address] [place] [address] [phone]          Medication List    STOP taking these medications       heparin 5000 UNIT/ML injection      TAKE these medications       acetaminophen 500 MG  tablet  Commonly known as:  TYLENOL  Take 500-1,000 mg by mouth every 8 (eight) hours as needed for moderate pain or headache.     ferrous sulfate 325 (65 FE) MG tablet  Take 325 mg by mouth daily with breakfast.     Rivaroxaban 15 & 20 MG Tbpk  Commonly known as:  XARELTO STARTER PACK  Take as directed on package: Take one 19m tab by mouth twice a day with food. On Day 22, switch to one 254mtab once a day with food.          Disposition: 01-Home or Self Care  Discharged Condition: Linda DADOas met maximum benefit of inpatient care and is medically stable and cleared for discharge.  Patient is pending follow up as above.      Time spent on disposition:  Greater than 35 minutes.   Signed: KaMarijean HeathNP 05/04/2013  10:09 AM Pager: [phone]*Care during the described time interval was provided by me and/or other providers on the critical care team. I have reviewed this patient's available data, including medical history, events of note, physical examination and test results as part of my evaluation.

## 2013-05-12 ENCOUNTER — Ambulatory Visit (INDEPENDENT_AMBULATORY_CARE_PROVIDER_SITE_OTHER): Payer: No Typology Code available for payment source | Admitting: Adult Health

## 2013-05-12 ENCOUNTER — Other Ambulatory Visit (INDEPENDENT_AMBULATORY_CARE_PROVIDER_SITE_OTHER): Payer: Self-pay

## 2013-05-12 ENCOUNTER — Encounter: Payer: Self-pay | Admitting: Adult Health

## 2013-05-12 VITALS — BP 104/62 | HR 65 | Temp 97.9°F | Ht 68.0 in | Wt 160.6 lb

## 2013-05-12 DIAGNOSIS — I82409 Acute embolism and thrombosis of unspecified deep veins of unspecified lower extremity: Secondary | ICD-10-CM

## 2013-05-12 LAB — CBC WITH DIFFERENTIAL/PLATELET
BASOS ABS: 0 10*3/uL (ref 0.0–0.1)
Basophils Relative: 0.7 % (ref 0.0–3.0)
EOS ABS: 0.2 10*3/uL (ref 0.0–0.7)
EOS PCT: 4.2 % (ref 0.0–5.0)
HCT: 30.3 % — ABNORMAL LOW (ref 36.0–46.0)
Hemoglobin: 10.1 g/dL — ABNORMAL LOW (ref 12.0–15.0)
LYMPHS ABS: 2.3 10*3/uL (ref 0.7–4.0)
Lymphocytes Relative: 42.5 % (ref 12.0–46.0)
MCHC: 33.3 g/dL (ref 30.0–36.0)
MCV: 83.1 fl (ref 78.0–100.0)
MONO ABS: 0.4 10*3/uL (ref 0.1–1.0)
Monocytes Relative: 7.1 % (ref 3.0–12.0)
NEUTROS PCT: 45.5 % (ref 43.0–77.0)
Neutro Abs: 2.4 10*3/uL (ref 1.4–7.7)
PLATELETS: 571 10*3/uL — AB (ref 150.0–400.0)
RBC: 3.65 Mil/uL — ABNORMAL LOW (ref 3.87–5.11)
RDW: 16.2 % — AB (ref 11.5–14.6)
WBC: 5.4 10*3/uL (ref 4.5–10.5)

## 2013-05-12 NOTE — Patient Instructions (Signed)
Continue on Xarelto starter pack , then transition to 20mg  daily  Follow up Dr. Melvyn Novas  In 6-8 weeks and As needed   Call if develop any bleeding at all .

## 2013-05-12 NOTE — Progress Notes (Signed)
   Subjective:    Patient ID: Linda Dalton, female    DOB: 05/26/1975, 38 y.o.   MRN: [id number]  HPI 38 year old female admitted by PCCM for Acute DVT s/p thrombolytics 05/01/13  05/12/2013 [facility] follow up  Patient returns for a post hospital followup. Patient was admitted April 19 2 April 22 for an acute left leg extensive DVT, requiring thrombolytic therapy. Prior to admission. Patient had been on recent hormone therapy study drug for fibroids and was status post hysterectomy on 04/14/2013. She presented to the emergency room with significant left lower leg swelling, and was found to have a extensive left lower leg DVT. Patient was taken to interventional radiology for thrombolysis/thrombbectomy . Followup venography revealed patent veins w/ small residual nonocclusive clot  She was transitioned from heparin drip to Xarelto on 4/21. 2-D echo showed normal systolic function, EF 54-65%, and normal, PA pressures. Since discharge. Patient is feeling much better. Leg /groin pain resolved after thrombolysis.  Wears TED hose. Does have some leg swelling esp at end of day.   No bleeding, chest pain, orthopnea, dyspnea.  Seen by GYN earlier this week, no vaginal bleeding. Has knot along surgical scar    Review of Systems Constitutional:   No  weight loss, night sweats,  Fevers, chills, fatigue, or  lassitude.  HEENT:   No headaches,  Difficulty swallowing,  Tooth/dental problems, or  Sore throat,                No sneezing, itching, ear ache, nasal congestion, post nasal drip,   CV:  No chest pain,  Orthopnea, PND,   anasarca, dizziness, palpitations, syncope.   GI  No heartburn, indigestion, abdominal pain, nausea, vomiting, diarrhea, change in bowel habits, loss of appetite, bloody stools.   Resp: No shortness of breath with exertion or at rest.  No excess mucus, no productive cough,  No non-productive cough,  No coughing up of blood.  No change in color of mucus.  No wheezing.  No  chest wall deformity  Skin: no rash or lesions.  GU: no dysuria, change in color of urine, no urgency or frequency.  No flank pain, no hematuria   MS:  No joint pain or swelling.  No decreased range of motion.  No back pain.  Psych:  No change in mood or affect. No depression or anxiety.  No memory loss.         Objective:   Physical Exam GEN: A/Ox3; pleasant , NAD, well nourished   HEENT:  [place]/AT,  EACs-clear, TMs-wnl, NOSE-clear, THROAT-clear, no lesions, no postnasal drip or exudate noted.   NECK:  Supple w/ fair ROM; no JVD; normal carotid impulses w/o bruits; no thyromegaly or nodules palpated; no lymphadenopathy.  RESP  Clear  P & A; w/o, wheezes/ rales/ or rhonchi.no accessory muscle use, no dullness to percussion  CARD:  RRR, no m/r/g  ,  pulses intact, no cyanosis or clubbing. Mild asymmetrical left leg swelling   GI:   Soft & nt; nml bowel sounds; no organomegaly or masses detected.headed mid line surgical scar , firm knot along incision line.   Musco: Warm bil, no deformities or joint swelling noted.   Neuro: alert, no focal deficits noted.    Skin: Warm, no lesions or rashes         Assessment & Plan:

## 2013-05-12 NOTE — Progress Notes (Signed)
Quick Note:  Called spoke with patient, advised of lab results / recs as stated by TP. Pt verbalized her understanding and denied any questions. ______ 

## 2013-05-12 NOTE — Discharge Summary (Signed)
Supervised discharge. > 35 min dc planning   Dr. Brand Males, M.D., [facility].C.P Pulmonary and Critical Care Medicine Staff Physician [place] [facility] Pager: [phone], If no answer or between  15:00h - 7:00h: call [phone]  05/12/2013 5:04 PM

## 2013-05-12 NOTE — Assessment & Plan Note (Addendum)
Extensive Left leg DVT s/p thrombolysis (in setting of HRT and recent hysterectomy)  Doing well on Xarelto  Need cbc to follow anemia   Plan  Continue on Xarelto starter pack , then transition to 20mg  daily  Follow up Dr. Melvyn Novas  In 6-8 weeks and As needed   Call if develop any bleeding at all .

## 2013-05-13 ENCOUNTER — Inpatient Hospital Stay: Payer: No Typology Code available for payment source | Admitting: Adult Health

## 2013-05-19 ENCOUNTER — Other Ambulatory Visit: Payer: Self-pay | Admitting: Radiology

## 2013-05-19 DIAGNOSIS — I82409 Acute embolism and thrombosis of unspecified deep veins of unspecified lower extremity: Secondary | ICD-10-CM

## 2013-06-01 ENCOUNTER — Other Ambulatory Visit: Payer: Self-pay

## 2013-06-01 ENCOUNTER — Inpatient Hospital Stay: Admission: RE | Admit: 2013-06-01 | Payer: Self-pay | Source: Ambulatory Visit

## 2013-06-02 ENCOUNTER — Telehealth: Payer: Self-pay | Admitting: Internal Medicine

## 2013-06-02 MED ORDER — RIVAROXABAN 20 MG PO TABS: 20.0000 mg | ORAL_TABLET | Freq: Every day | ORAL | Status: DC

## 2013-06-02 NOTE — Telephone Encounter (Signed)
I spoke with the pt and rx has been sent to pharmacy. [place], CMA

## 2013-06-14 ENCOUNTER — Encounter: Payer: Self-pay | Admitting: *Deleted

## 2013-06-14 ENCOUNTER — Other Ambulatory Visit: Payer: Self-pay

## 2013-06-14 ENCOUNTER — Telehealth: Payer: Self-pay | Admitting: Internal Medicine

## 2013-06-14 NOTE — Telephone Encounter (Signed)
Spoke with the pt  She states that her work would not accept the note that we have in the computer from Linda Madrid, NP b/c it was too old  She returned to work yesterday, but the would not let her work b/c needing new note stating okay to return on 06/13/13  I have redone note and faxed to her employer at [phone] Nothing further needed

## 2013-06-23 ENCOUNTER — Ambulatory Visit: Payer: Self-pay | Admitting: Internal Medicine

## 2013-06-24 ENCOUNTER — Encounter (HOSPITAL_COMMUNITY): Payer: Self-pay | Admitting: Emergency Medicine

## 2013-06-24 ENCOUNTER — Emergency Department (HOSPITAL_COMMUNITY)
Admission: EM | Admit: 2013-06-24 | Discharge: 2013-06-24 | Disposition: A | Discharge status: Home / Self Care | Payer: Medicaid Other | Attending: Emergency Medicine | Admitting: Emergency Medicine

## 2013-06-24 ENCOUNTER — Emergency Department (HOSPITAL_COMMUNITY): Payer: Medicaid Other

## 2013-06-24 DIAGNOSIS — J45909 Unspecified asthma, uncomplicated: Secondary | ICD-10-CM | POA: Insufficient documentation

## 2013-06-24 DIAGNOSIS — Z8542 Personal history of malignant neoplasm of other parts of uterus: Secondary | ICD-10-CM | POA: Insufficient documentation

## 2013-06-24 DIAGNOSIS — Z7901 Long term (current) use of anticoagulants: Secondary | ICD-10-CM | POA: Insufficient documentation

## 2013-06-24 DIAGNOSIS — Z79899 Other long term (current) drug therapy: Secondary | ICD-10-CM | POA: Insufficient documentation

## 2013-06-24 DIAGNOSIS — J039 Acute tonsillitis, unspecified: Secondary | ICD-10-CM

## 2013-06-24 DIAGNOSIS — Z87891 Personal history of nicotine dependence: Secondary | ICD-10-CM | POA: Insufficient documentation

## 2013-06-24 LAB — I-STAT CHEM 8, ED
BUN: 13 mg/dL (ref 6–23)
CALCIUM ION: 1.13 mmol/L (ref 1.12–1.23)
CREATININE: 0.6 mg/dL (ref 0.50–1.10)
Chloride: 104 mEq/L (ref 96–112)
Glucose, Bld: 90 mg/dL (ref 70–99)
HCT: 42 % (ref 36.0–46.0)
Hemoglobin: 14.3 g/dL (ref 12.0–15.0)
POTASSIUM: 3.7 meq/L (ref 3.7–5.3)
Sodium: 140 mEq/L (ref 137–147)
TCO2: 22 mmol/L (ref 0–100)

## 2013-06-24 MED ORDER — SODIUM CHLORIDE 0.9 % IV BOLUS (SEPSIS)
1000.0000 mL | Freq: Once | INTRAVENOUS | Status: AC
  Administered 2013-06-24: 1000 mL via INTRAVENOUS

## 2013-06-24 MED ORDER — HYDROXYZINE HCL 25 MG PO TABS: 25.0000 mg | ORAL_TABLET | Freq: Once | ORAL | Status: DC

## 2013-06-24 MED ORDER — MORPHINE SULFATE 4 MG/ML IJ SOLN
4.0000 mg | Freq: Once | INTRAMUSCULAR | Status: AC
  Administered 2013-06-24: 4 mg via INTRAVENOUS
  Filled 2013-06-24: qty 1

## 2013-06-24 MED ORDER — PREDNISONE 20 MG PO TABS: 60.0000 mg | ORAL_TABLET | Freq: Once | ORAL | Status: DC

## 2013-06-24 MED ORDER — IOHEXOL 300 MG/ML  SOLN
75.0000 mL | Freq: Once | INTRAMUSCULAR | Status: AC | PRN
  Administered 2013-06-24: 75 mL via INTRAVENOUS

## 2013-06-24 MED ORDER — HYDROMORPHONE HCL PF 1 MG/ML IJ SOLN
1.0000 mg | Freq: Once | INTRAMUSCULAR | Status: AC
  Administered 2013-06-24: 1 mg via INTRAVENOUS
  Filled 2013-06-24: qty 1

## 2013-06-24 MED ORDER — HYDROCODONE-ACETAMINOPHEN 7.5-325 MG/15ML PO SOLN: 15.0000 mL | Freq: Three times a day (TID) | ORAL | Status: DC | PRN

## 2013-06-24 MED ORDER — ONDANSETRON HCL 4 MG/2ML IJ SOLN
4.0000 mg | Freq: Once | INTRAMUSCULAR | Status: AC
  Administered 2013-06-24: 4 mg via INTRAVENOUS
  Filled 2013-06-24: qty 2

## 2013-06-24 MED ORDER — AMOXICILLIN 400 MG/5ML PO SUSR: 500.0000 mg | Freq: Two times a day (BID) | ORAL | Status: DC

## 2013-06-24 MED ORDER — BENZOCAINE (TOPICAL) 20 % EX AERO: 1.0000 "application " | INHALATION_SPRAY | Freq: Four times a day (QID) | CUTANEOUS | Status: DC | PRN

## 2013-06-24 NOTE — ED Notes (Signed)
Pt from home, c/o sore throat x 2 days.

## 2013-06-24 NOTE — Discharge Instructions (Signed)

## 2013-06-24 NOTE — ED Provider Notes (Signed)
CSN: [id number]     Arrival date & time 06/24/13  1017 History   First MD Initiated Contact with Patient 06/24/13 1020     Chief Complaint  Patient presents with  . Sore Throat     (Consider location/radiation/quality/duration/timing/severity/associated sxs/prior Treatment) HPI  Is a 38 year old female who presents emergency Department with chief complaint of sore throat. Patient states his ongoing on for 2 days. She complains of pain in the right side of her throat. She has pain with swallowing, denies dizziness. She has associated pain in the right ear without drainage. She denies fevers, chills, myalgias. Patient is nonsmoker.  She is able to tolerate solid food and fluid  Past Medical History  Diagnosis Date  . Asthma   . Fibroid tumor    Past Surgical History  Procedure Laterality Date  . Breast surgery    . Tubal ligation     History reviewed. No pertinent family history. History  Substance Use Topics  . Smoking status: Former Smoker -- 1.00 packs/day for 8 years    Types: Cigarettes    Quit date: 04/22/2013  . Smokeless tobacco: Not on file  . Alcohol Use: No   OB History   Grav Para Term Preterm Abortions TAB SAB Ect Mult Living                 Review of Systems  Ten systems reviewed and are negative for acute change, except as noted in the HPI.    Allergies  Biaxin and Codeine  Home Medications   Prior to Admission medications   Medication Sig Start Date End Date Taking? Authorizing Provider  acetaminophen (TYLENOL) 500 MG tablet Take 1,000 mg by mouth every 6 (six) hours as needed for moderate pain or headache.    Yes Historical Provider, MD  ibuprofen (ADVIL,MOTRIN) 200 MG tablet Take 400 mg by mouth every 6 (six) hours as needed for mild pain.   Yes Historical Provider, MD  Phenol (QC SORE THROAT SPRAY MT) Use as directed 2 sprays in the mouth or throat daily as needed (for sore throat).   Yes Historical Provider, MD  Rivaroxaban (XARELTO) 15 MG TABS  tablet Take 15 mg by mouth daily.   Yes Historical Provider, MD   BP 130/76  Pulse 79  Temp(Src) 98.5 F (36.9 C) (Oral)  Resp 20  SpO2 100%  LMP 03/15/2013 Physical Exam  Constitutional: She is oriented to person, place, and time. She appears well-developed and well-nourished. No distress.  HENT:  Head: Normocephalic and atraumatic.  Mouth/Throat: Uvula is midline. Oropharyngeal exudate present.    Right tonsilar swelling and swelling. No trismus. A midline, exudate present.   Eyes: Conjunctivae are normal. No scleral icterus.  Neck: Normal range of motion.  Cardiovascular: Normal rate, regular rhythm and normal heart sounds.  Exam reveals no gallop and no friction rub.   No murmur heard. Pulmonary/Chest: Effort normal and breath sounds normal. No respiratory distress.  Abdominal: Soft. Bowel sounds are normal. She exhibits no distension and no mass. There is no tenderness. There is no guarding.  Neurological: She is alert and oriented to person, place, and time.  Skin: Skin is warm and dry. She is not diaphoretic.    ED Course  Procedures (including critical care time) Labs Review Labs Reviewed - No data to display  Imaging Review Ct Soft Tissue Neck W Contrast  06/24/2013   CLINICAL DATA:  Sore throat.  Painful the swallow.  EXAM: CT NECK WITH CONTRAST  TECHNIQUE: Multidetector  CT imaging of the neck was performed using the standard protocol following the bolus administration of intravenous contrast.  CONTRAST:  32mL OMNIPAQUE IOHEXOL 300 MG/ML  SOLN  COMPARISON:  None.  FINDINGS: There is significant enlargement of the right tonsils. Small scattered low-density areas noted within the right tonsil concerning for early tonsillar abscess. The largest low-density area measures 8 mm on image 33. No evidence of cervical adenopathy. Small scattered cervical lymph nodes bilaterally, none pathologically enlarged. Visualized paranasal sinuses and mastoids are clear. Visualized orbital  soft tissues unremarkable.  Submandibular, parotid, and thyroid glands are unremarkable. Airway is patent. Visualized upper lung fields are clear.  IMPRESSION: Significant enlargement of the right tonsil compatible with tonsillitis with probable early abscess formation.   Electronically Signed   By: Rolm Baptise M.D.   On: 06/24/2013 13:28     EKG Interpretation None      MDM   Final diagnoses:  Tonsillitis   Patient with  Unilateral tonsilar swelling.  Muffled voice. ? PTA. CT pending. Afebrile/ patient is very uncomfortable.  Patient / Family / Caregiver understand and agree with initial ED impression and plan with expectations set for ED visit.   2:21 PM BP 109/81  Pulse 77  Temp(Src) 98.5 F (36.9 C) (Oral)  Resp 20  SpO2 100%  LMP 03/15/2013 Patient with tonsillitis. CT shows some possible developing abscess. Patient tolerating her secretions well. Afebrile, airway is patent. VSS.Patient will be dischargedwith liquid amoxicillin and pain medication. Follow up with ear nose and throat. Discussed return precautions.Patient / Family / Caregiver informed of clinical course, understand medical decision-making process, and agree with plan.    Margarita Mail, PA-C 06/24/13 2051

## 2013-06-27 NOTE — ED Provider Notes (Signed)
Medical screening examination/treatment/procedure(s) were performed by non-physician practitioner and as supervising physician I was immediately available for consultation/collaboration.   EKG Interpretation None        Delice Bison Ward, DO 06/27/13 1506

## 2013-06-29 ENCOUNTER — Other Ambulatory Visit: Payer: Self-pay

## 2013-08-10 ENCOUNTER — Ambulatory Visit
Admission: RE | Admit: 2013-08-10 | Discharge: 2013-08-10 | Disposition: A | Discharge status: Home / Self Care | Payer: Medicaid Other | Source: Ambulatory Visit | Attending: Radiology | Admitting: Radiology

## 2013-08-10 DIAGNOSIS — I82409 Acute embolism and thrombosis of unspecified deep veins of unspecified lower extremity: Secondary | ICD-10-CM

## 2013-11-02 ENCOUNTER — Emergency Department (HOSPITAL_COMMUNITY)
Admission: EM | Admit: 2013-11-02 | Discharge: 2013-11-02 | Disposition: A | Discharge status: Home / Self Care | Payer: Medicaid Other | Attending: Emergency Medicine | Admitting: Emergency Medicine

## 2013-11-02 ENCOUNTER — Encounter (HOSPITAL_COMMUNITY): Payer: Self-pay | Admitting: Emergency Medicine

## 2013-11-02 ENCOUNTER — Emergency Department (HOSPITAL_COMMUNITY): Payer: Medicaid Other

## 2013-11-02 DIAGNOSIS — J45909 Unspecified asthma, uncomplicated: Secondary | ICD-10-CM | POA: Insufficient documentation

## 2013-11-02 DIAGNOSIS — Z8742 Personal history of other diseases of the female genital tract: Secondary | ICD-10-CM | POA: Diagnosis not present

## 2013-11-02 DIAGNOSIS — R109 Unspecified abdominal pain: Secondary | ICD-10-CM

## 2013-11-02 DIAGNOSIS — Z87891 Personal history of nicotine dependence: Secondary | ICD-10-CM | POA: Insufficient documentation

## 2013-11-02 DIAGNOSIS — R1032 Left lower quadrant pain: Secondary | ICD-10-CM | POA: Diagnosis present

## 2013-11-02 DIAGNOSIS — Z9851 Tubal ligation status: Secondary | ICD-10-CM | POA: Insufficient documentation

## 2013-11-02 DIAGNOSIS — Z3202 Encounter for pregnancy test, result negative: Secondary | ICD-10-CM | POA: Insufficient documentation

## 2013-11-02 LAB — URINALYSIS, ROUTINE W REFLEX MICROSCOPIC
Bilirubin Urine: NEGATIVE
Glucose, UA: NEGATIVE mg/dL
HGB URINE DIPSTICK: NEGATIVE
KETONES UR: NEGATIVE mg/dL
Leukocytes, UA: NEGATIVE
Nitrite: NEGATIVE
Protein, ur: NEGATIVE mg/dL
SPECIFIC GRAVITY, URINE: 1.027 (ref 1.005–1.030)
Urobilinogen, UA: 0.2 mg/dL (ref 0.0–1.0)
pH: 8.5 — ABNORMAL HIGH (ref 5.0–8.0)

## 2013-11-02 LAB — URINE MICROSCOPIC-ADD ON

## 2013-11-02 LAB — PREGNANCY, URINE: Preg Test, Ur: NEGATIVE

## 2013-11-02 MED ORDER — ONDANSETRON HCL 4 MG/2ML IJ SOLN
4.0000 mg | Freq: Once | INTRAMUSCULAR | Status: AC
  Administered 2013-11-02: 4 mg via INTRAVENOUS
  Filled 2013-11-02: qty 2

## 2013-11-02 MED ORDER — SODIUM CHLORIDE 0.9 % IV BOLUS (SEPSIS)
1000.0000 mL | Freq: Once | INTRAVENOUS | Status: AC
  Administered 2013-11-02: 1000 mL via INTRAVENOUS

## 2013-11-02 MED ORDER — HYDROMORPHONE HCL 1 MG/ML IJ SOLN
1.0000 mg | Freq: Once | INTRAMUSCULAR | Status: AC
  Administered 2013-11-02: 1 mg via INTRAVENOUS
  Filled 2013-11-02: qty 1

## 2013-11-02 MED ORDER — TRAMADOL HCL 50 MG PO TABS: 50.0000 mg | ORAL_TABLET | Freq: Four times a day (QID) | ORAL | Status: DC | PRN

## 2013-11-02 MED ORDER — ONDANSETRON 8 MG PO TBDP: 8.0000 mg | ORAL_TABLET | Freq: Three times a day (TID) | ORAL | Status: DC | PRN

## 2013-11-02 MED ORDER — HYDROMORPHONE HCL 1 MG/ML IJ SOLN
1.0000 mg | Freq: Once | INTRAMUSCULAR | Status: AC
Start: 2013-11-02 — End: 2013-11-02
  Administered 2013-11-02: 1 mg via INTRAVENOUS
  Filled 2013-11-02: qty 1

## 2013-11-02 NOTE — ED Notes (Signed)
Per pt sts lower abdominal pain radiating around to lower back and pressure like she has to void since last night. sts some vomiting.

## 2013-11-02 NOTE — ED Notes (Signed)
Patient transported to CT 

## 2013-11-02 NOTE — ED Provider Notes (Signed)
CSN: [id number]     Arrival date & time 11/02/13  1003 History   First MD Initiated Contact with Patient 11/02/13 1105     Chief Complaint  Patient presents with  . Abdominal Pain      HPI Patient presents emergency department complaining of some lower abdominal discomfort and a feeling like she needs to void.symptoms began last night. Symptoms somewhat improving now. +nausea and vomiting. Denies diarrhea. No fever or chills. No hx of urinary retention or SBO in the past. No cp or SOB. No back pain. Pain waxes and wanes. No blood in stool. Nothing improves or worsens her symptoms   Past Medical History  Diagnosis Date  . Asthma   . Fibroid tumor    Past Surgical History  Procedure Laterality Date  . Breast surgery    . Tubal ligation     History reviewed. No pertinent family history. History  Substance Use Topics  . Smoking status: Former Smoker -- 1.00 packs/day for 8 years    Types: Cigarettes    Quit date: 04/22/2013  . Smokeless tobacco: Not on file  . Alcohol Use: No   OB History   Grav Para Term Preterm Abortions TAB SAB Ect Mult Living                 Review of Systems  All other systems reviewed and are negative.     Allergies  Biaxin and Codeine  Home Medications   Prior to Admission medications   Medication Sig Start Date End Date Taking? Authorizing Provider  ondansetron (ZOFRAN ODT) 8 MG disintegrating tablet Take 1 tablet (8 mg total) by mouth every 8 (eight) hours as needed for nausea or vomiting. 11/02/13   Hoy Morn, MD  traMADol (ULTRAM) 50 MG tablet Take 1 tablet (50 mg total) by mouth every 6 (six) hours as needed. 11/02/13   Hoy Morn, MD   BP 118/77  Pulse 63  Temp(Src) 98.1 F (36.7 C) (Oral)  Resp 16  SpO2 97%  LMP 03/15/2013 Physical Exam  Nursing note and vitals reviewed. Constitutional: She is oriented to person, place, and time. She appears well-developed and well-nourished. No distress.  HENT:  Head: Normocephalic  and atraumatic.  Eyes: EOM are normal.  Neck: Normal range of motion.  Cardiovascular: Normal rate, regular rhythm and normal heart sounds.   Pulmonary/Chest: Effort normal and breath sounds normal.  Abdominal: Soft. She exhibits no distension. There is no tenderness.  Musculoskeletal: Normal range of motion.  Neurological: She is alert and oriented to person, place, and time.  Skin: Skin is warm and dry.  Psychiatric: She has a normal mood and affect. Judgment normal.    ED Course  Procedures (including critical care time) Labs Review Labs Reviewed  URINALYSIS, ROUTINE W REFLEX MICROSCOPIC - Abnormal; Notable for the following:    APPearance TURBID (*)    pH 8.5 (*)    All other components within normal limits  URINE MICROSCOPIC-ADD ON - Abnormal; Notable for the following:    Squamous Epithelial / LPF FEW (*)    All other components within normal limits  URINE CULTURE  PREGNANCY, URINE    Imaging Review Ct Abdomen Pelvis Wo Contrast  11/02/2013   CLINICAL DATA:  Left flank pain and left lower abdominal pain. Question ureteral stone. Prior hysterectomy.  EXAM: CT ABDOMEN AND PELVIS WITHOUT CONTRAST  TECHNIQUE: Multidetector CT imaging of the abdomen and pelvis was performed following the standard protocol without IV contrast.  COMPARISON:  None.  FINDINGS: Minimal hazy opacity in the subpleural lung bases likely reflects minimal atelectasis.  Punctate calcification is noted in the caudate. Tiny gallstone is questioned. The spleen, adrenal glands, and pancreas have an unremarkable unenhanced appearance. No hydronephrosis or renal calculi are identified. There is focal cortical thinning posteriorly in the upper pole of the right kidney suggestive of scarring. There is mildly increased attenuation of the renal medulla throughout both kidneys, which may reflect dehydration. No ureteral dilatation or definite ureteral stones are identified.  There is no evidence of bowel obstruction. No  gross bowel wall thickening is seen. Appendix is retrocecal in location and is without inflammatory change. The uterus is absent. A 4.8 cm cyst is noted in the left adnexa, likely physiologic. Suture material is noted in the pelvis.  A vascular stent is present in the left common iliac vein extending into the IVC. No free fluid or enlarged lymph nodes are identified. No acute osseous abnormality is identified.  IMPRESSION: 1. No hydronephrosis or urinary tract calculi identified. Increased density of the renal medulla, suggestive of dehydration. 2. 4.8 cm benign-appearing cyst in the left adnexa, likely physiologic. If there is clinical concern for a gynecologic origin of the patient's acute pain, consider further evaluation with pelvic ultrasound.   Electronically Signed   By: Logan Bores   On: 11/02/2013 15:24  I personally reviewed the imaging tests through PACS system I reviewed available ER/hospitalization records through the EMR    EKG Interpretation None      MDM   Final diagnoses:  Abdominal pain, unspecified abdominal location     Symptoms improved in ER. Ct scan without acute pathology. Urine without signs of infection. Doubt urinary retention as pt able to fully empty her bladder now. Dc home with pcp follow up  Hoy Morn, MD 11/09/13 1318

## 2013-11-02 NOTE — Discharge Instructions (Signed)

## 2013-11-05 LAB — URINE CULTURE: Colony Count: >=100000

## 2013-11-06 ENCOUNTER — Telehealth (HOSPITAL_BASED_OUTPATIENT_CLINIC_OR_DEPARTMENT_OTHER): Payer: Self-pay | Admitting: Emergency Medicine

## 2013-11-06 NOTE — Telephone Encounter (Signed)
Post ED Visit - Positive Culture Follow-up: Successful Patient Follow-Up  Culture assessed and recommendations reviewed by: []  Wes [place], Pharm.D., BCPS []  Heide Guile, Pharm.D., BCPS []  Alycia Rossetti, Pharm.D., BCPS [x]  [place], [place].D., BCPS, AAHIVP []  Legrand Como, Pharm.D., BCPS, AAHIVP []  Hassie Bruce, Pharm.D. []  Milus Glazier, [place].D.  Positive urine culture >100,000 colonies/ml  [x]  Patient discharged without antimicrobial prescription and treatment is now indicated []  Organism is resistant to prescribed ED discharge antimicrobial []  Patient with positive blood cultures  Changes discussed with ED provider: 10/27/2013  Carlisle Cater PA New antibiotic prescription Septra DS 1 po bid x 3 days Called to [place] [phone]  Contacted patient, date 11/06/13, time [place], Linda Dalton 11/06/2013, 11:56 AM

## 2013-11-06 NOTE — Progress Notes (Signed)
ED Antimicrobial Stewardship Positive Culture Follow Up   Linda Dalton is an 38 y.o. female who presented to [facility] on 11/02/2013 with a chief complaint of  Chief Complaint  Patient presents with  . Abdominal Pain    Recent Results (from the past 720 hour(s))  URINE CULTURE     Status: None   Collection Time    11/02/13 10:30 AM      Result Value Ref Range Status   Specimen Description URINE, RANDOM   Final   Special Requests NONE   Final   Culture  Setup Time     Final   Value: 11/02/2013 20:26     Performed at [place]     Final   Value: >=100,000 COLONIES/ML     Performed at [facility]   Culture     Final   Value: ESCHERICHIA COLI     Performed at [facility]   Report Status 11/05/2013 FINAL   Final   Organism ID, Bacteria ESCHERICHIA COLI   Final    [x]  Patient discharged originally without antimicrobial agent and treatment is now indicated  37 who presented with abd pain. She "feels like she needs to voids". Culture has come back with e.coli now. Will treat with septra  New antibiotic prescription:  Septra DS 1 PO BID x3 days  ED Provider: [facility]   Onnie Boer, PharmD Pager: [phone] Infectious Diseases Pharmacist Phone# [phone]

## 2013-12-28 ENCOUNTER — Other Ambulatory Visit (HOSPITAL_COMMUNITY): Payer: Self-pay | Admitting: Interventional Radiology

## 2013-12-28 DIAGNOSIS — I82402 Acute embolism and thrombosis of unspecified deep veins of left lower extremity: Secondary | ICD-10-CM

## 2014-01-18 ENCOUNTER — Inpatient Hospital Stay: Admission: RE | Admit: 2014-01-18 | Payer: Medicaid Other | Source: Ambulatory Visit

## 2014-01-26 ENCOUNTER — Encounter (HOSPITAL_COMMUNITY): Payer: Self-pay | Admitting: Emergency Medicine

## 2014-01-26 ENCOUNTER — Emergency Department (HOSPITAL_COMMUNITY): Payer: Medicaid Other

## 2014-01-26 ENCOUNTER — Emergency Department (HOSPITAL_COMMUNITY)
Admission: EM | Admit: 2014-01-26 | Discharge: 2014-01-26 | Disposition: A | Discharge status: Home / Self Care | Payer: Medicaid Other | Attending: Emergency Medicine | Admitting: Emergency Medicine

## 2014-01-26 DIAGNOSIS — W109XXA Fall (on) (from) unspecified stairs and steps, initial encounter: Secondary | ICD-10-CM | POA: Diagnosis not present

## 2014-01-26 DIAGNOSIS — S82851A Displaced trimalleolar fracture of right lower leg, initial encounter for closed fracture: Secondary | ICD-10-CM

## 2014-01-26 DIAGNOSIS — J45909 Unspecified asthma, uncomplicated: Secondary | ICD-10-CM | POA: Diagnosis not present

## 2014-01-26 DIAGNOSIS — Y9289 Other specified places as the place of occurrence of the external cause: Secondary | ICD-10-CM | POA: Diagnosis not present

## 2014-01-26 DIAGNOSIS — Y998 Other external cause status: Secondary | ICD-10-CM | POA: Insufficient documentation

## 2014-01-26 DIAGNOSIS — T148XXA Other injury of unspecified body region, initial encounter: Secondary | ICD-10-CM

## 2014-01-26 DIAGNOSIS — Y9389 Activity, other specified: Secondary | ICD-10-CM | POA: Insufficient documentation

## 2014-01-26 DIAGNOSIS — Z79899 Other long term (current) drug therapy: Secondary | ICD-10-CM | POA: Insufficient documentation

## 2014-01-26 DIAGNOSIS — Z8742 Personal history of other diseases of the female genital tract: Secondary | ICD-10-CM | POA: Insufficient documentation

## 2014-01-26 DIAGNOSIS — Z87891 Personal history of nicotine dependence: Secondary | ICD-10-CM | POA: Diagnosis not present

## 2014-01-26 DIAGNOSIS — W19XXXA Unspecified fall, initial encounter: Secondary | ICD-10-CM

## 2014-01-26 DIAGNOSIS — S99911A Unspecified injury of right ankle, initial encounter: Secondary | ICD-10-CM | POA: Diagnosis present

## 2014-01-26 MED ORDER — OXYCODONE-ACETAMINOPHEN 5-325 MG PO TABS: 1.0000 | ORAL_TABLET | Freq: Four times a day (QID) | ORAL | Status: DC | PRN

## 2014-01-26 MED ORDER — OXYCODONE-ACETAMINOPHEN 5-325 MG PO TABS
1.0000 | ORAL_TABLET | Freq: Once | ORAL | Status: AC
  Administered 2014-01-26: 1 via ORAL
  Filled 2014-01-26: qty 1

## 2014-01-26 NOTE — ED Notes (Signed)
Pt reports stepping down wrong on her rt ankle and hearing a popping sound. Pt has swelling to rt ankle. Pulses intact and no deformity noted. Pt rates pain 10/10.

## 2014-01-26 NOTE — ED Provider Notes (Signed)
CSN: [id number]     Arrival date & time 01/26/14  0545 History   First MD Initiated Contact with Patient 01/26/14 0703     Chief Complaint  Patient presents with  . Ankle Injury     (Consider location/radiation/quality/duration/timing/severity/associated sxs/prior Treatment) Patient is a 39 y.o. female presenting with lower extremity injury. The history is provided by the patient.  Ankle Injury This is a new problem.   patient was stepping by her stairs and landed awkwardly on her right foot and twisted. She heard a pop and had severe pain. No other injury. She states when she moves the ankle it hurts and is moving all spots was not supposed to. She does not eat breakfast this morning. No numbness or weakness. She states she does not want to move the foot because it hurts. She is otherwise healthy.  Past Medical History  Diagnosis Date  . Asthma   . Fibroid tumor    Past Surgical History  Procedure Laterality Date  . Breast surgery    . Tubal ligation     History reviewed. No pertinent family history. History  Substance Use Topics  . Smoking status: Former Smoker -- 1.00 packs/day for 8 years    Types: Cigarettes    Quit date: 04/22/2013  . Smokeless tobacco: Not on file  . Alcohol Use: No   OB History    No data available     Review of Systems  Constitutional: Negative for fever.  Musculoskeletal: Negative for back pain and neck pain.       Right ankle pain.  Skin: Negative for wound.  Neurological: Negative for weakness and numbness.      Allergies  Biaxin and Codeine  Home Medications   Prior to Admission medications   Medication Sig Start Date End Date Taking? Authorizing Provider  calcium carbonate (TUMS - DOSED IN MG ELEMENTAL CALCIUM) 500 MG chewable tablet Chew 1 tablet by mouth daily.   Yes Historical Provider, MD  ferrous sulfate 325 (65 FE) MG tablet Take 325 mg by mouth 3 (three) times daily with meals.   Yes Historical Provider, MD  ondansetron  (ZOFRAN ODT) 8 MG disintegrating tablet Take 1 tablet (8 mg total) by mouth every 8 (eight) hours as needed for nausea or vomiting. Patient not taking: Reported on 01/26/2014 11/02/13   Hoy Morn, MD  oxyCODONE-acetaminophen (PERCOCET/ROXICET) 5-325 MG per tablet Take 1-2 tablets by mouth every 6 (six) hours as needed. 01/26/14   Jasper Riling. Azaya Goedde, MD  traMADol (ULTRAM) 50 MG tablet Take 1 tablet (50 mg total) by mouth every 6 (six) hours as needed. Patient not taking: Reported on 01/26/2014 11/02/13   Hoy Morn, MD   BP 114/76 mmHg  Pulse 61  Temp(Src) 98.5 F (36.9 C) (Oral)  Resp 20  SpO2 99%  LMP 03/15/2013 Physical Exam  Constitutional: She appears well-developed and well-nourished.  HENT:  Head: Normocephalic and atraumatic.  Cardiovascular: Normal rate and regular rhythm.   Pulmonary/Chest: Effort normal and breath sounds normal.  Musculoskeletal:  No tenderness over proximal right fibula. No tenderness over knee. There is tenderness and some mild deformity over bilateral malleoli on right ankle. Strong dorsalis pedis pulse. Able to wiggle toes.  Neurological: She is alert.  Sensation grossly intact over right foot.    ED Course  Procedures (including critical care time) Labs Review Labs Reviewed - No data to display  Imaging Review Dg Ankle Complete Right  01/26/2014   CLINICAL DATA:  Slipped and  fell on NM vague increment earlier today, injuring the right ankle. Initial encounter.  EXAM: RIGHT ANKLE - COMPLETE 3+ VIEW  COMPARISON:  None.  FINDINGS: Comminuted obliquely oriented fracture involving the distal fibula. Comminuted transverse fracture involving the tip of the medial malleolus. Nondisplaced fracture involving the medial aspect of the distal tibia, with this fracture likely extending to the medial articular surface. Comminuted mildly displaced fracture involving the posterior malleolus. Slight posterior subluxation of the talus relative to the tibia.  Moderate-sized joint effusion/hemarthrosis. Well preserved bone mineral density. Well preserved joint spaces.  IMPRESSION: 1. Acute traumatic trimalleolar fracture with slight posterior subluxation of the talus relative to the tibia and mild displacement of the distal fibular and posterior malleolar fragments. 2. Nondisplaced acute traumatic fracture involving the medial distal tibia above the malleolus, likely extending to the articular surface.   Electronically Signed   By: Evangeline Dakin M.D.   On: 01/26/2014 08:01   Ct Ankle Right Wo Contrast  01/26/2014   CLINICAL DATA:  Patient fell this morning landing on her leg under her body.  EXAM: CT OF THE RIGHT ANKLE WITHOUT CONTRAST  TECHNIQUE: Multidetector CT imaging of the right ankle was performed according to the standard protocol. Multiplanar CT image reconstructions were also generated.  COMPARISON:  None.  FINDINGS: There is mildly comminuted fracture of the medial malleolus without significant displacement. There is a comminuted fracture of the posterior malleolus involving the articular surface with 8 mm of posterior displacement and mild anterior subluxation of the distal tibia relative to the talar dome. There is an oblique comminuted, mildly displaced fracture of the distal fibular metaphysis with 3 mm of posterior displacement. There is widening of the medial tibiotalar joint.  There is no other fracture or dislocation. There is generalized osteopenia. 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 There is mild osteoarthritis of the subtalar joints.  There is soft tissue swelling around knee ankle joint most severe over the lateral malleolus. The visualized flexor, extensor, peroneal and Achilles tendons are intact. There is no fluid collection, hematoma or soft tissue mass. The visualized muscles are normal.  IMPRESSION: 1. Trimalleolar right ankle fracture at described above.   Electronically Signed   By: Kathreen Devoid    On: 01/26/2014 09:22     EKG Interpretation None      MDM   Final diagnoses:  Fall  Fracture  Trimalleolar fracture of ankle, closed, right, initial encounter    Patient with fall. Trimalleolar ankle fracture is closed. Discussed with Dr.Xu, will see the patient in consult later in the day in the office but recommend CT scan of the ankle. Likely operative repair.    Jasper Riling. Alvino Chapel, MD 01/27/14 1540

## 2014-01-26 NOTE — ED Notes (Signed)
MD at bedside. 

## 2014-01-26 NOTE — Discharge Instructions (Signed)
Ankle Fracture  A fracture is a break in a bone. The ankle joint is made up of three bones. These include the lower (distal)sections of your lower leg bones, called the tibia and fibula, along with a bone in your foot, called the talus. Depending on how bad the break is and if more than one ankle joint bone is broken, a cast or splint is used to protect and keep your injured bone from moving while it heals. Sometimes, surgery is required to help the fracture heal properly.   There are two general types of fractures:   Stable fracture. This includes a single fracture line through one bone, with no injury to ankle ligaments. A fracture of the talus that does not have any displacement (movement of the bone on either side of the fracture line) is also stable.   Unstable fracture. This includes more than one fracture line through one or more bones in the ankle joint. It also includes fractures that have displacement of the bone on either side of the fracture line.  CAUSES   A direct blow to the ankle.    Quickly and severely twisting your ankle.   Trauma, such as a car accident or falling from a significant height.  RISK FACTORS  You may be at a higher risk of ankle fracture if:   You have certain medical conditions.   You are involved in high-impact sports.   You are involved in a high-impact car accident.  SIGNS AND SYMPTOMS    Tender and swollen ankle.   Bruising around the injured ankle.   Pain on movement of the ankle.   Difficulty walking or putting weight on the ankle.   A cold foot below the site of the ankle injury. This can occur if the blood vessels passing through your injured ankle were also damaged.   Numbness in the foot below the site of the ankle injury.  DIAGNOSIS   An ankle fracture is usually diagnosed with a physical exam and X-rays. A CT scan may also be required for complex fractures.  TREATMENT   Stable fractures are treated with a cast or splint and using crutches to avoid putting  weight on your injured ankle. This is followed by an ankle strengthening program. Some patients require a special type of cast, depending on other medical problems they may have. Unstable fractures require surgery to ensure the bones heal properly. Your health care provider will tell you what type of fracture you have and the best treatment for your condition.  HOME CARE INSTRUCTIONS    Review correct crutch use with your health care provider and use your crutches as directed. Safe use of crutches is extremely important. Misuse of crutches can cause you to fall or cause injury to nerves in your hands or armpits.   Do not put weight or pressure on the injured ankle until directed by your health care provider.   To lessen the swelling, keep the injured leg elevated while sitting or lying down.   Apply ice to the injured area:   Put ice in a plastic bag.   Place a towel between your cast and the bag.   Leave the ice on for 20 minutes, 2-3 times a day.   If you have a plaster or fiberglass cast:   Do not try to scratch the skin under the cast with any objects. This can increase your risk of skin infection.   Check the skin around the cast every day. You   may put lotion on any red or sore areas.   Keep your cast dry and clean.   If you have a plaster splint:   Wear the splint as directed.   You may loosen the elastic around the splint if your toes become numb, tingle, or turn cold or blue.   Do not put pressure on any part of your cast or splint; it may break. Rest your cast only on a pillow the first 24 hours until it is fully hardened.   Your cast or splint can be protected during bathing with a plastic bag sealed to your skin with medical tape. Do not lower the cast or splint into water.   Take medicines as directed by your health care provider. Only take over-the-counter or prescription medicines for pain, discomfort, or fever as directed by your health care provider.   Do not drive a vehicle until  your health care provider specifically tells you it is safe to do so.   If your health care provider has given you a follow-up appointment, it is very important to keep that appointment. Not keeping the appointment could result in a chronic or permanent injury, pain, and disability. If you have any problem keeping the appointment, call the facility for assistance.  SEEK MEDICAL CARE IF:  You develop increased swelling or discomfort.  SEEK IMMEDIATE MEDICAL CARE IF:    Your cast gets damaged or breaks.   You have continued severe pain.   You develop new pain or swelling after the cast was put on.   Your skin or toenails below the injury turn blue or gray.   Your skin or toenails below the injury feel cold, numb, or have loss of sensitivity to touch.   There is a bad smell or pus draining from under the cast.  MAKE SURE YOU:    Understand these instructions.   Will watch your condition.   Will get help right away if you are not doing well or get worse.  Document Released: 12/28/1999 Document Revised: 01/04/2013 Document Reviewed: 07/29/2012  [employer] Patient Information 2015 [employer]. This information is not intended to replace advice given to you by your health care provider. Make sure you discuss any questions you have with your health care provider.

## 2014-01-26 NOTE — ED Notes (Signed)
Ortho at bedside.

## 2014-01-26 NOTE — ED Notes (Signed)
Pt to CT

## 2014-01-30 ENCOUNTER — Telehealth: Payer: Self-pay | Admitting: Emergency Medicine

## 2014-01-30 ENCOUNTER — Other Ambulatory Visit (HOSPITAL_COMMUNITY): Payer: Self-pay | Admitting: Orthopaedic Surgery

## 2014-01-30 NOTE — Telephone Encounter (Signed)
PT CALLED TO LET us KNOW THAT SHE BROKE HER ANKLE IN 3 PLACES AND IS HAVING SURGERY ON 02-02-14 W/ DR SHOE. WANTED TO KNOW IF DR HM WAS OK WITH HER JUST HAVING LOVENOX IN THE HOSPITAL POST PROCEDURE.    DR [facility] WITH THAT AND WOULD LIKE TO SEE HER BACK AFTER SHE IS HEALED FROM PROCEDURE.  NOTIFIED PT OF ABOVE-SHE WILL CALL BACK AFTER HER F/U W/ DR SHOE TO SCHEDULED APPT. HERE.

## 2014-02-01 ENCOUNTER — Encounter (HOSPITAL_COMMUNITY): Payer: Self-pay | Admitting: *Deleted

## 2014-02-01 MED ORDER — CEFAZOLIN SODIUM-DEXTROSE 2-3 GM-% IV SOLR
2.0000 g | INTRAVENOUS | Status: AC
  Administered 2014-02-02: 2 g via INTRAVENOUS
  Filled 2014-02-01: qty 50

## 2014-02-01 NOTE — Progress Notes (Signed)
Pt denies SOB, chest pain, and being under the care of a cardiologist. Pt denies having a stress test and cardiac cath. Pt denies having an EKG within the last year. Pt made aware to stop taking Aspirin, vitamins, and herbal medications. Do not take any NSAIDs ie: Ibuprofen, Advil, Naproxen or any medication containing Aspirin.

## 2014-02-01 NOTE — H&P (Signed)
PREOPERATIVE H&P  Chief Complaint: Right ankle fracture  HPI: Linda Dalton is a 39 y.o. female who presents for surgical treatment of Right ankle fracture.  She denies any changes in medical history.  Past Medical History  Diagnosis Date  . Asthma   . Fibroid tumor   . PONV (postoperative nausea and vomiting)   . DVT (deep venous thrombosis)   . Presence of IVC filter    Past Surgical History  Procedure Laterality Date  . Breast surgery    . Tubal ligation    . Abdominal hysterectomy     History   Social History  . Marital Status: Married    Spouse Name: N/A    Number of Children: N/A  . Years of Education: N/A   Social History Main Topics  . Smoking status: Former Smoker -- 1.00 packs/day for 8 years    Types: Cigarettes    Quit date: 04/22/2013  . Smokeless tobacco: Never Used  . Alcohol Use: Yes     Comment: rare  . Drug Use: No  . Sexual Activity: Yes    Birth Control/ Protection: Surgical   Other Topics Concern  . None   Social History Narrative   Family History  Problem Relation Age of Onset  . Congestive Heart Failure Mother   . Diabetes Mother   . Asthma Mother   . Hypertension Mother   . Asthma Father   . Sleep apnea Father   . Hypercholesterolemia Father   . Heart murmur Sister   . Heart disease Brother   . Cancer Other   . Congestive Heart Failure Other    Allergies  Allergen Reactions  . Biaxin [Clarithromycin] Swelling    Mouth swelling  . Codeine Hives   Prior to Admission medications   Medication Sig Start Date End Date Taking? Authorizing Provider  calcium carbonate (TUMS - DOSED IN MG ELEMENTAL CALCIUM) 500 MG chewable tablet Chew 1 tablet by mouth daily.   Yes Historical Provider, MD  ferrous sulfate 325 (65 FE) MG tablet Take 325 mg by mouth 3 (three) times daily with meals.   Yes Historical Provider, MD  oxyCODONE-acetaminophen (PERCOCET/ROXICET) 5-325 MG per tablet Take 1-2 tablets by mouth every 6 (six) hours as  needed. 01/26/14  Yes Nathan R. Pickering, MD  ondansetron (ZOFRAN ODT) 8 MG disintegrating tablet Take 1 tablet (8 mg total) by mouth every 8 (eight) hours as needed for nausea or vomiting. Patient not taking: Reported on 01/26/2014 11/02/13   Hoy Morn, MD  traMADol (ULTRAM) 50 MG tablet Take 1 tablet (50 mg total) by mouth every 6 (six) hours as needed. Patient not taking: Reported on 01/26/2014 11/02/13   Hoy Morn, MD     Positive ROS: All other systems have been reviewed and were otherwise negative with the exception of those mentioned in the HPI and as above.  Physical Exam: General: Alert, no acute distress Cardiovascular: No pedal edema Respiratory: No cyanosis, no use of accessory musculature GI: abdomen soft Skin: No lesions in the area of chief complaint Neurologic: Sensation intact distally Psychiatric: Patient is competent for consent with normal mood and affect Lymphatic: no lymphedema  MUSCULOSKELETAL: exam stable  Assessment: Right ankle fracture  Plan: Plan for Procedure(s): OPEN REDUCTION INTERNAL FIXATION (ORIF) RIGHT TRIMALLEOLAR ANKLE FRACTURE  The risks benefits and alternatives were discussed with the patient including but not limited to the risks of nonoperative treatment, versus surgical intervention including infection, bleeding, nerve injury,  blood clots, cardiopulmonary  complications, morbidity, mortality, among others, and they were willing to proceed.   Marianna Payment, MD   02/01/2014 7:14 PM

## 2014-02-02 ENCOUNTER — Ambulatory Visit (HOSPITAL_COMMUNITY): Payer: Medicaid Other | Admitting: Anesthesiology

## 2014-02-02 ENCOUNTER — Encounter (HOSPITAL_COMMUNITY): Payer: Self-pay | Admitting: *Deleted

## 2014-02-02 ENCOUNTER — Ambulatory Visit (HOSPITAL_COMMUNITY): Payer: Medicaid Other

## 2014-02-02 ENCOUNTER — Ambulatory Visit (HOSPITAL_COMMUNITY)
Admission: RE | Admit: 2014-02-02 | Discharge: 2014-02-02 | Disposition: A | Discharge status: Home / Self Care | Payer: Medicaid Other | Source: Ambulatory Visit | Attending: Orthopaedic Surgery | Admitting: Orthopaedic Surgery

## 2014-02-02 ENCOUNTER — Encounter (HOSPITAL_COMMUNITY)
Admission: RE | Disposition: A | Discharge status: Home / Self Care | Payer: Self-pay | Source: Ambulatory Visit | Attending: Orthopaedic Surgery

## 2014-02-02 DIAGNOSIS — X58XXXA Exposure to other specified factors, initial encounter: Secondary | ICD-10-CM | POA: Insufficient documentation

## 2014-02-02 DIAGNOSIS — Z87891 Personal history of nicotine dependence: Secondary | ICD-10-CM | POA: Diagnosis not present

## 2014-02-02 DIAGNOSIS — S82891A Other fracture of right lower leg, initial encounter for closed fracture: Secondary | ICD-10-CM

## 2014-02-02 DIAGNOSIS — Y998 Other external cause status: Secondary | ICD-10-CM | POA: Diagnosis not present

## 2014-02-02 DIAGNOSIS — Y9289 Other specified places as the place of occurrence of the external cause: Secondary | ICD-10-CM | POA: Insufficient documentation

## 2014-02-02 DIAGNOSIS — Z888 Allergy status to other drugs, medicaments and biological substances status: Secondary | ICD-10-CM | POA: Diagnosis not present

## 2014-02-02 DIAGNOSIS — J45909 Unspecified asthma, uncomplicated: Secondary | ICD-10-CM | POA: Diagnosis not present

## 2014-02-02 DIAGNOSIS — Z86718 Personal history of other venous thrombosis and embolism: Secondary | ICD-10-CM | POA: Diagnosis not present

## 2014-02-02 DIAGNOSIS — K219 Gastro-esophageal reflux disease without esophagitis: Secondary | ICD-10-CM | POA: Diagnosis not present

## 2014-02-02 DIAGNOSIS — Z885 Allergy status to narcotic agent status: Secondary | ICD-10-CM | POA: Insufficient documentation

## 2014-02-02 DIAGNOSIS — Y9389 Activity, other specified: Secondary | ICD-10-CM | POA: Diagnosis not present

## 2014-02-02 DIAGNOSIS — S82851A Displaced trimalleolar fracture of right lower leg, initial encounter for closed fracture: Secondary | ICD-10-CM | POA: Insufficient documentation

## 2014-02-02 HISTORY — DX: Acute embolism and thrombosis of unspecified deep veins of unspecified lower extremity: I82.409

## 2014-02-02 HISTORY — DX: Presence of other vascular implants and grafts: Z95.828

## 2014-02-02 HISTORY — DX: Other specified postprocedural states: R11.2

## 2014-02-02 HISTORY — PX: ORIF ANKLE FRACTURE: SHX5408

## 2014-02-02 HISTORY — DX: Nausea with vomiting, unspecified: Z98.890

## 2014-02-02 LAB — BASIC METABOLIC PANEL
Anion gap: 8 (ref 5–15)
BUN: 12 mg/dL (ref 6–23)
CO2: 24 mmol/L (ref 19–32)
CREATININE: 0.71 mg/dL (ref 0.50–1.10)
Calcium: 8.7 mg/dL (ref 8.4–10.5)
Chloride: 106 mEq/L (ref 96–112)
GFR calc non Af Amer: >90 mL/min (ref >90)
Glucose, Bld: 95 mg/dL (ref 70–99)
Potassium: 4 mmol/L (ref 3.5–5.1)
Sodium: 138 mmol/L (ref 135–145)

## 2014-02-02 LAB — CBC
HCT: 37.9 % (ref 36.0–46.0)
HEMOGLOBIN: 12.9 g/dL (ref 12.0–15.0)
MCH: 30.6 pg (ref 26.0–34.0)
MCHC: 34 g/dL (ref 30.0–36.0)
MCV: 90 fL (ref 78.0–100.0)
PLATELETS: 235 10*3/uL (ref 150–400)
RBC: 4.21 MIL/uL (ref 3.87–5.11)
RDW: 13 % (ref 11.5–15.5)
WBC: 4.5 10*3/uL (ref 4.0–10.5)

## 2014-02-02 SURGERY — OPEN REDUCTION INTERNAL FIXATION (ORIF) ANKLE FRACTURE
Anesthesia: General | Site: Ankle | Laterality: Right

## 2014-02-02 MED ORDER — FENTANYL CITRATE 0.05 MG/ML IJ SOLN
INTRAMUSCULAR | Status: AC
  Filled 2014-02-02: qty 5

## 2014-02-02 MED ORDER — ASPIRIN EC 325 MG PO TBEC: 325.0000 mg | DELAYED_RELEASE_TABLET | Freq: Once | ORAL | Status: DC

## 2014-02-02 MED ORDER — FENTANYL CITRATE 0.05 MG/ML IJ SOLN
INTRAMUSCULAR | Status: DC
Start: 2014-02-02 — End: 2014-02-02
  Filled 2014-02-02: qty 2

## 2014-02-02 MED ORDER — DEXAMETHASONE SODIUM PHOSPHATE 4 MG/ML IJ SOLN
INTRAMUSCULAR | Status: DC | PRN
  Administered 2014-02-02: 4 mg via INTRAVENOUS

## 2014-02-02 MED ORDER — BUPIVACAINE-EPINEPHRINE (PF) 0.5% -1:200000 IJ SOLN
INTRAMUSCULAR | Status: DC | PRN
  Administered 2014-02-02: 30 mL via PERINEURAL

## 2014-02-02 MED ORDER — OXYCODONE HCL 5 MG PO TABS: 5.0000 mg | ORAL_TABLET | ORAL | Status: DC | PRN

## 2014-02-02 MED ORDER — MIDAZOLAM HCL 2 MG/2ML IJ SOLN
1.0000 mg | INTRAMUSCULAR | Status: DC | PRN
  Administered 2014-02-02: 1 mg via INTRAVENOUS

## 2014-02-02 MED ORDER — FENTANYL CITRATE 0.05 MG/ML IJ SOLN
50.0000 ug | INTRAMUSCULAR | Status: DC | PRN
  Administered 2014-02-02: 50 ug via INTRAVENOUS

## 2014-02-02 MED ORDER — PROMETHAZINE HCL 25 MG/ML IJ SOLN: 6.2500 mg | INTRAMUSCULAR | Status: DC | PRN

## 2014-02-02 MED ORDER — ONDANSETRON HCL 4 MG/2ML IJ SOLN
INTRAMUSCULAR | Status: DC | PRN
  Administered 2014-02-02: 4 mg via INTRAVENOUS

## 2014-02-02 MED ORDER — ROCURONIUM BROMIDE 50 MG/5ML IV SOLN
INTRAVENOUS | Status: AC
  Filled 2014-02-02: qty 1

## 2014-02-02 MED ORDER — LACTATED RINGERS IV SOLN
INTRAVENOUS | Status: DC | PRN
  Administered 2014-02-02 (×2): via INTRAVENOUS

## 2014-02-02 MED ORDER — LACTATED RINGERS IV SOLN
INTRAVENOUS | Status: DC
  Administered 2014-02-02: 12:00:00 via INTRAVENOUS

## 2014-02-02 MED ORDER — SCOPOLAMINE 1 MG/3DAYS TD PT72
MEDICATED_PATCH | TRANSDERMAL | Status: AC
  Administered 2014-02-02: 1 via TRANSDERMAL
  Filled 2014-02-02: qty 1

## 2014-02-02 MED ORDER — FENTANYL CITRATE 0.05 MG/ML IJ SOLN
INTRAMUSCULAR | Status: DC | PRN
  Administered 2014-02-02 (×2): 50 ug via INTRAVENOUS
  Administered 2014-02-02: 100 ug via INTRAVENOUS
  Administered 2014-02-02 (×2): 25 ug via INTRAVENOUS

## 2014-02-02 MED ORDER — ENOXAPARIN SODIUM 40 MG/0.4ML ~~LOC~~ SOLN: 40.0000 mg | Freq: Every day | SUBCUTANEOUS | Status: DC

## 2014-02-02 MED ORDER — PROPOFOL 10 MG/ML IV BOLUS
INTRAVENOUS | Status: DC | PRN
  Administered 2014-02-02: 160 mg via INTRAVENOUS

## 2014-02-02 MED ORDER — LIDOCAINE HCL (CARDIAC) 20 MG/ML IV SOLN
INTRAVENOUS | Status: AC
  Filled 2014-02-02: qty 5

## 2014-02-02 MED ORDER — MIDAZOLAM HCL 2 MG/2ML IJ SOLN
INTRAMUSCULAR | Status: AC
  Filled 2014-02-02: qty 2

## 2014-02-02 MED ORDER — 0.9 % SODIUM CHLORIDE (POUR BTL) OPTIME
TOPICAL | Status: DC | PRN
  Administered 2014-02-02: 1000 mL

## 2014-02-02 MED ORDER — HYDROMORPHONE HCL 1 MG/ML IJ SOLN
0.2500 mg | INTRAMUSCULAR | Status: DC | PRN
  Administered 2014-02-02: 0.5 mg via INTRAVENOUS

## 2014-02-02 MED ORDER — MIDAZOLAM HCL 5 MG/5ML IJ SOLN
INTRAMUSCULAR | Status: DC | PRN
  Administered 2014-02-02: 2 mg via INTRAVENOUS

## 2014-02-02 MED ORDER — PROPOFOL 10 MG/ML IV BOLUS
INTRAVENOUS | Status: AC
  Filled 2014-02-02: qty 20

## 2014-02-02 MED ORDER — HYDROMORPHONE HCL 1 MG/ML IJ SOLN
INTRAMUSCULAR | Status: AC
  Filled 2014-02-02: qty 1

## 2014-02-02 MED ORDER — LIDOCAINE HCL (CARDIAC) 20 MG/ML IV SOLN
INTRAVENOUS | Status: DC | PRN
  Administered 2014-02-02: 100 mg via INTRAVENOUS

## 2014-02-02 MED ORDER — ONDANSETRON HCL 4 MG/2ML IJ SOLN
INTRAMUSCULAR | Status: AC
  Filled 2014-02-02: qty 2

## 2014-02-02 SURGICAL SUPPLY — 70 items
BANDAGE ELASTIC 4 VELCRO ST LF (GAUZE/BANDAGES/DRESSINGS) ×1 IMPLANT
BANDAGE ELASTIC 6 VELCRO ST LF (GAUZE/BANDAGES/DRESSINGS) IMPLANT
BANDAGE ESMARK 6X9 LF (GAUZE/BANDAGES/DRESSINGS) ×1 IMPLANT
BIT DRILL 3.5X122MM AO FIT (BIT) ×1 IMPLANT
BIT DRILL CANN 2.7 (BIT) ×2
BIT DRILL SRG 2.7XCANN AO CPLG (BIT) IMPLANT
BIT DRL SRG 2.7XCANN AO CPLNG (BIT) ×1
BNDG CMPR 9X6 STRL LF SNTH (GAUZE/BANDAGES/DRESSINGS) ×1
BNDG COHESIVE 4X5 TAN STRL (GAUZE/BANDAGES/DRESSINGS) ×2 IMPLANT
BNDG COHESIVE 6X5 TAN STRL LF (GAUZE/BANDAGES/DRESSINGS) ×2 IMPLANT
BNDG ESMARK 6X9 LF (GAUZE/BANDAGES/DRESSINGS) ×2
CANISTER SUCT 3000ML (MISCELLANEOUS) ×1 IMPLANT
COVER SURGICAL LIGHT HANDLE (MISCELLANEOUS) ×2 IMPLANT
CUFF TOURNIQUET SINGLE 34IN LL (TOURNIQUET CUFF) IMPLANT
DRAPE C-ARM 42X72 X-RAY (DRAPES) ×2 IMPLANT
DRAPE C-ARMOR (DRAPES) ×2 IMPLANT
DRAPE IMP U-DRAPE 54X76 (DRAPES) ×2 IMPLANT
DRAPE INCISE IOBAN 66X45 STRL (DRAPES) ×2 IMPLANT
DRAPE U-SHAPE 47X51 STRL (DRAPES) ×4 IMPLANT
DRILL 2.6X122MM WL AO SHAFT (BIT) ×1 IMPLANT
DURAPREP 26ML APPLICATOR (WOUND CARE) ×2 IMPLANT
ELECT CAUTERY BLADE 6.4 (BLADE) ×2 IMPLANT
ELECT REM PT RETURN 9FT ADLT (ELECTROSURGICAL) ×2
ELECTRODE REM PT RTRN 9FT ADLT (ELECTROSURGICAL) ×1 IMPLANT
FACESHIELD WRAPAROUND (MASK) ×2 IMPLANT
FACESHIELD WRAPAROUND OR TEAM (MASK) ×1 IMPLANT
GAUZE SPONGE 4X4 12PLY STRL (GAUZE/BANDAGES/DRESSINGS) ×2 IMPLANT
GAUZE XEROFORM 5X9 LF (GAUZE/BANDAGES/DRESSINGS) ×2 IMPLANT
GLOVE NEODERM STRL 7.5 LF PF (GLOVE) ×1 IMPLANT
GLOVE SURG NEODERM 7.5  LF PF (GLOVE) ×1
GLOVE SURG SYN 7.5  E (GLOVE) ×2
GLOVE SURG SYN 7.5 E (GLOVE) ×2 IMPLANT
GLOVE SURG SYN 7.5 PF PI (GLOVE) ×1 IMPLANT
GOWN STRL REIN XL XLG (GOWN DISPOSABLE) ×6 IMPLANT
K-WIRE ORTHOPEDIC 1.4X150L (WIRE) ×6
KIT BASIN OR (CUSTOM PROCEDURE TRAY) ×2 IMPLANT
KIT ROOM TURNOVER OR (KITS) ×2 IMPLANT
KWIRE ORTHOPEDIC 1.4X150L (WIRE) IMPLANT
NDL HYPO 25GX1X1/2 BEV (NEEDLE) IMPLANT
NEEDLE HYPO 25GX1X1/2 BEV (NEEDLE) IMPLANT
NS IRRIG 1000ML POUR BTL (IV SOLUTION) ×2 IMPLANT
PACK ORTHO EXTREMITY (CUSTOM PROCEDURE TRAY) ×2 IMPLANT
PAD ARMBOARD 7.5X6 YLW CONV (MISCELLANEOUS) ×4 IMPLANT
PAD CAST 3X4 CTTN HI CHSV (CAST SUPPLIES) ×2 IMPLANT
PADDING CAST ABS 3INX4YD NS (CAST SUPPLIES) ×1
PADDING CAST ABS 6INX4YD NS (CAST SUPPLIES) ×1
PADDING CAST ABS COTTON 3X4 (CAST SUPPLIES) IMPLANT
PADDING CAST ABS COTTON 6X4 NS (CAST SUPPLIES) IMPLANT
PADDING CAST COTTON 3X4 STRL (CAST SUPPLIES) ×4
PADDING CAST COTTON 6X4 STRL (CAST SUPPLIES) ×2 IMPLANT
PLATE DISTAL FIBULA 3HOLE (Plate) ×1 IMPLANT
SCREW 3.5X10MM (Screw) ×2 IMPLANT
SCREW BONE 14MMX3.5MM (Screw) ×3 IMPLANT
SCREW BONE NON-LCKING 3.5X12MM (Screw) ×1 IMPLANT
SCREW CANNULATED 4.0X38MM (Screw) ×2 IMPLANT
SCREW NONLOCK 22MM (Screw) ×1 IMPLANT
SPONGE GAUZE 4X4 12PLY STER LF (GAUZE/BANDAGES/DRESSINGS) ×1 IMPLANT
SPONGE LAP 18X18 X RAY DECT (DISPOSABLE) IMPLANT
SUCTION FRAZIER TIP 10 FR DISP (SUCTIONS) ×2 IMPLANT
SUT ETHILON 2 0 FS 18 (SUTURE) IMPLANT
SUT ETHILON 3 0 PS 1 (SUTURE) IMPLANT
SUT VIC AB 0 CT1 27 (SUTURE)
SUT VIC AB 0 CT1 27XBRD ANBCTR (SUTURE) IMPLANT
SUT VIC AB 2-0 CT1 27 (SUTURE)
SUT VIC AB 2-0 CT1 TAPERPNT 27 (SUTURE) IMPLANT
SYR CONTROL 10ML LL (SYRINGE) IMPLANT
TOWEL OR 17X24 6PK STRL BLUE (TOWEL DISPOSABLE) ×2 IMPLANT
TOWEL OR 17X26 10 PK STRL BLUE (TOWEL DISPOSABLE) ×4 IMPLANT
TUBE CONNECTING 12X1/4 (SUCTIONS) ×2 IMPLANT
WATER STERILE IRR 1000ML POUR (IV SOLUTION) ×2 IMPLANT

## 2014-02-02 NOTE — Op Note (Signed)
   Date of Surgery: 02/02/2014  INDICATIONS: Ms. Linda Dalton is a 39 y.o.-year-old female who sustained a right ankle fracture; she was indicated for open reduction and internal fixation due to the displaced nature of the articular fracture and came to the operating room today for this procedure. The patient did consent to the procedure after discussion of the risks and benefits.  PREOPERATIVE DIAGNOSIS: right trimalleolar ankle fracture  POSTOPERATIVE DIAGNOSIS: Same.  PROCEDURE: Open treatment of right ankle fracture with internal fixation. Trimalleolar w/o fixation of posterior malleolus CPT 27822.   SURGEON: N. Eduard Roux, M.D.  ASSIST: none.  ANESTHESIA:  general, regional  IV FLUIDS AND URINE: See anesthesia.  ESTIMATED BLOOD LOSS: minimal mL.  IMPLANTS: [employer] Variax 4 hole distal fibula plate  COMPLICATIONS: None.  DESCRIPTION OF PROCEDURE: The patient was brought to the operating room and placed supine on the operating table.  The patient had been signed prior to the procedure and this was documented. The patient had the anesthesia placed by the anesthesiologist.  A nonsterile tourniquet was placed on the upper thigh.  The prep verification and incision time-outs were performed to confirm that this was the correct patient, site, side and location. The patient had an SCD on the opposite lower extremity. The patient did receive antibiotics prior to the incision and was re-dosed during the procedure as needed at indicated intervals.  The patient had the lower extremity prepped and draped in the standard surgical fashion.  The extremity was exsanguinated using an esmarch bandage and the tourniquet was inflated to 300 mm Hg.  The bony landmarks were palpated and a lateral incision over the distal fibula was used. Full-thickness flaps were created and elevated off of the fibula. The superficial peroneal nerve was not encountered. The fracture was exposed. Periosteum was elevated off  of the fracture site. Any organized hematoma and entrapped periosteum was retrieved from the fracture site. This was thoroughly irrigated. We then affected a reduction and held in place with a tenaculum clamp. This was confirmed under fluoroscopy. I then placed a anterior to posterior lag screw by technique. The clamp was removed the fracture stayed reduced. We then placed a 4 hole distal fibular plate on the lateral aspect of the fibula. 6 nonlocking screws were placed through the plate 3 above and 3 below the fracture. We then turned our attention to the medial malleolus fracture. Given the nondisplaced nature we were able to place 2 cannulated screws over K wires in a percutaneous fashion. He first screw was partially threaded to allow compression and the second screw was fully threaded to hold fracture in place. Final x-rays were taken the K wires were taken out. The wounds were thoroughly irrigated and closed in layer fashion using 2-0 Vicryl and 3-0 nylon. Sterile dressings were applied. The extremity was immobilized in a short-leg splint. Patient was transferred to the PACU in stable condition.  POSTOPERATIVE PLAN: Ms. Linda Dalton will remain nonweightbearing on this leg for approximately 6 weeks; Ms. Linda Dalton will return for suture removal in 2 weeks.  He will be immobilized in a short leg splint and then transitioned to a short leg cast at his first follow up appointment.  Ms. Linda Dalton will receive DVT prophylaxis based on other medications, activity level, and risk ratio of bleeding to thrombosis.  Azucena Cecil, MD [facility] [phone] 2:46 PM

## 2014-02-02 NOTE — Transfer of Care (Signed)
Immediate Anesthesia Transfer of Care Note  Patient: Linda Dalton  Procedure(s) Performed: Procedure(s): OPEN REDUCTION INTERNAL FIXATION (ORIF) RIGHT TRIMALLEOLAR ANKLE FRACTURE (Right)  Patient Location: PACU  Anesthesia Type:GA combined with regional for post-op pain  Level of Consciousness: awake, alert  and oriented  Airway & Oxygen Therapy: Patient Spontanous Breathing and Patient connected to nasal cannula oxygen  Post-op Assessment: Report given to PACU RN and Post -op Vital signs reviewed and stable  Post vital signs: Reviewed and stable  Complications: No apparent anesthesia complications

## 2014-02-02 NOTE — Anesthesia Preprocedure Evaluation (Addendum)
Anesthesia Evaluation  Patient identified by MRN, date of birth, ID band Patient awake    Reviewed: Allergy & Precautions, NPO status , Patient's Chart, lab work & pertinent test results  History of Anesthesia Complications (+) PONV  Airway Mallampati: I  TM Distance: >3 FB Neck ROM: Full    Dental  (+) Teeth Intact, Dental Advisory Given   Pulmonary asthma , former smoker,  breath sounds clear to auscultation        Cardiovascular Rhythm:Regular Rate:Normal     Neuro/Psych    GI/Hepatic GERD-  ,  Endo/Other    Renal/GU      Musculoskeletal   Abdominal   Peds  Hematology   Anesthesia Other Findings   Reproductive/Obstetrics                            Anesthesia Physical Anesthesia Plan  ASA: II  Anesthesia Plan: General   Post-op Pain Management:    Induction:   Airway Management Planned:   Additional Equipment:   Intra-op Plan:   Post-operative Plan: Extubation in OR  Informed Consent: I have reviewed the patients History and Physical, chart, labs and discussed the procedure including the risks, benefits and alternatives for the proposed anesthesia with the patient or authorized representative who has indicated his/her understanding and acceptance.   Dental advisory given  Plan Discussed with: CRNA and Surgeon  Anesthesia Plan Comments:         Anesthesia Quick Evaluation

## 2014-02-02 NOTE — Anesthesia Postprocedure Evaluation (Signed)
  Anesthesia Post-op Note  Patient: Linda Dalton  Procedure(s) Performed: Procedure(s): OPEN REDUCTION INTERNAL FIXATION (ORIF) RIGHT TRIMALLEOLAR ANKLE FRACTURE (Right)  Patient Location: PACU  Anesthesia Type:General and GA combined with regional for post-op pain  Level of Consciousness: awake, alert  and oriented  Airway and Oxygen Therapy: Patient Spontanous Breathing and Patient connected to nasal cannula oxygen  Post-op Pain: none  Post-op Assessment: Post-op Vital signs reviewed, Patient's Cardiovascular Status Stable, Respiratory Function Stable, Patent Airway, No signs of Nausea or vomiting and Pain level controlled  Post-op Vital Signs: stable  Last Vitals:  Filed Vitals:   02/02/14 1649  BP: 124/78  Pulse: 77  Temp:   Resp: 15    Complications: No apparent anesthesia complications

## 2014-02-02 NOTE — Anesthesia Procedure Notes (Addendum)
Anesthesia Regional Block:  Popliteal block  Pre-Anesthetic Checklist: ,, timeout performed, Correct Patient, Correct Site, Correct Laterality, Correct Procedure, Correct Position, site marked, Risks and benefits discussed,  Surgical consent,  Pre-op evaluation,  At surgeon's request and post-op pain management  Laterality: Right and Lower  Prep: chloraprep       Needles:   Needle Type: Echogenic Needle     Needle Length: 9cm 9 cm Needle Gauge: 21 and 21 G  Needle insertion depth: 5 cm   Additional Needles:  Procedures: ultrasound guided (picture in chart) and nerve stimulator Popliteal block Narrative:  Start time: 02/02/2014 12:45 PM End time: 02/02/2014 1:00 PM Injection made incrementally with aspirations every 5 mL.  Performed by: Personally  Anesthesiologist: MASSAG[place], TERRY   Procedure Name: LMA Insertion Date/Time: 02/02/2014 1:21 PM Performed by: Trixie Deis A Pre-anesthesia Checklist: Patient identified, Emergency Drugs available, Suction available, Patient being monitored and Timeout performed Patient Re-evaluated:Patient Re-evaluated prior to inductionOxygen Delivery Method: Circle system utilized Preoxygenation: Pre-oxygenation with 100% oxygen Intubation Type: IV induction Ventilation: Mask ventilation without difficulty LMA: LMA inserted LMA Size: 4.0 Number of attempts: 1 Placement Confirmation: positive ETCO2 and breath sounds checked- equal and bilateral Tube secured with: Tape Dental Injury: Teeth and Oropharynx as per pre-operative assessment

## 2014-02-02 NOTE — Discharge Instructions (Signed)
1. Keep splint clean and dry 2. Elevate foot above level of heart 3. Take lovenox to prevent blood clots 4. Take pain meds as needed  What to eat:  For your first meals, you should eat lightly; only small meals initially.  If you do not have nausea, you may eat larger meals.  Avoid spicy, greasy and heavy food.    General Anesthesia, Adult, Care After  Refer to this sheet in the next few weeks. These instructions provide you with information on caring for yourself after your procedure. Your health care provider may also give you more specific instructions. Your treatment has been planned according to current medical practices, but problems sometimes occur. Call your health care provider if you have any problems or questions after your procedure.  WHAT TO EXPECT AFTER THE PROCEDURE  After the procedure, it is typical to experience:  Sleepiness.  Nausea and vomiting. HOME CARE INSTRUCTIONS  For the first 24 hours after general anesthesia:  Have a responsible person with you.  Do not drive a car. If you are alone, do not take public transportation.  Do not drink alcohol.  Do not take medicine that has not been prescribed by your health care provider.  Do not sign important papers or make important decisions.  You may resume a normal diet and activities as directed by your health care provider.  Change bandages (dressings) as directed.  If you have questions or problems that seem related to general anesthesia, call the hospital and ask for the anesthetist or anesthesiologist on call. SEEK MEDICAL CARE IF:  You have nausea and vomiting that continue the day after anesthesia.  You develop a rash. SEEK IMMEDIATE MEDICAL CARE IF:  You have difficulty breathing.  You have chest pain.  You have any allergic problems. Document Released: 04/07/2000 Document Revised: 09/01/2012 Document Reviewed: 07/15/2012  [facility] Patient Information 2014 [place], [place].

## 2014-02-06 ENCOUNTER — Encounter (HOSPITAL_COMMUNITY): Payer: Self-pay | Admitting: Orthopaedic Surgery

## 2014-03-28 ENCOUNTER — Ambulatory Visit: Payer: Medicaid Other | Attending: Orthopaedic Surgery | Admitting: Physical Therapy

## 2014-03-28 DIAGNOSIS — M6281 Muscle weakness (generalized): Secondary | ICD-10-CM

## 2014-03-28 DIAGNOSIS — X58XXXD Exposure to other specified factors, subsequent encounter: Secondary | ICD-10-CM | POA: Insufficient documentation

## 2014-03-28 DIAGNOSIS — J45909 Unspecified asthma, uncomplicated: Secondary | ICD-10-CM | POA: Insufficient documentation

## 2014-03-28 DIAGNOSIS — S82851E Displaced trimalleolar fracture of right lower leg, subsequent encounter for open fracture type I or II with routine healing: Secondary | ICD-10-CM

## 2014-03-28 DIAGNOSIS — S82851D Displaced trimalleolar fracture of right lower leg, subsequent encounter for closed fracture with routine healing: Secondary | ICD-10-CM | POA: Insufficient documentation

## 2014-03-28 DIAGNOSIS — K219 Gastro-esophageal reflux disease without esophagitis: Secondary | ICD-10-CM | POA: Diagnosis not present

## 2014-03-28 DIAGNOSIS — M25674 Stiffness of right foot, not elsewhere classified: Secondary | ICD-10-CM

## 2014-03-28 DIAGNOSIS — R609 Edema, unspecified: Secondary | ICD-10-CM

## 2014-03-28 DIAGNOSIS — Z86718 Personal history of other venous thrombosis and embolism: Secondary | ICD-10-CM | POA: Insufficient documentation

## 2014-03-28 DIAGNOSIS — R262 Difficulty in walking, not elsewhere classified: Secondary | ICD-10-CM

## 2014-03-28 NOTE — Therapy (Signed)
[place], [place], [address] Phone: [phone]   Fax:  [phone]  Physical Therapy Evaluation  Patient Details  Name: Fatima Fedie MRN: [id number] Date of Birth: 1975-07-31 Referring Provider:  Leandrew Koyanagi, MD  Encounter Date: 03/28/2014      PT End of Session - 03/28/14 1211    Visit Number 1   Number of Visits 4   Date for PT Re-Evaluation 05/09/14   PT Start Time 9833   PT Stop Time 1207   PT Time Calculation (min) 62 min   Activity Tolerance Patient tolerated treatment well   Behavior During Therapy [facility] for tasks assessed/performed      Past Medical History  Diagnosis Date  . Asthma   . Fibroid tumor   . PONV (postoperative nausea and vomiting)   . DVT (deep venous thrombosis)   . Presence of IVC filter     Past Surgical History  Procedure Laterality Date  . Breast surgery    . Abdominal hysterectomy    . Tubal ligation    . Orif ankle fracture Right 02/02/2014    Procedure: OPEN REDUCTION INTERNAL FIXATION (ORIF) RIGHT TRIMALLEOLAR ANKLE FRACTURE;  Surgeon: Marianna Payment, MD;  Location: [place];  Service: Orthopedics;  Laterality: Right;    There were no vitals filed for this visit.  Visit Diagnosis:  Trimalleolar fracture, right, open type I or II, with routine healing, subsequent encounter  Difficulty walking  Edema  Decreased ROM of right foot  Decreased muscle strength      Subjective Assessment - 03/28/14 1112    Symptoms Pt has pain at night and it hurts at heel on right and I have to use crutches and my CAM boot   Pertinent History Pt fell over tree root on1-19-16   How long can you sit comfortably? unlimited   How long can you stand comfortably? 30 minutes   How long can you walk comfortably? 5 minutes   Patient Stated Goals I would love to get back to running and dance   Currently in Pain? Yes  intensifies at night   Pain Score 5   most  severe at night   Pain Location Ankle   Pain Orientation Right   Pain Descriptors / Indicators Throbbing   Pain Type Chronic pain            [facility] PT Assessment - 03/28/14 0001    Assessment   Medical Diagnosis Right trimalleolar fx with ORIF   Onset Date 01/31/14  surgery was on 02-02-14   Next MD Visit 05-11-14   Prior Therapy none   Precautions   Precautions None   Required Braces or Orthoses --  CAM boot all times. crutches as needed   Restrictions   Weight Bearing Restrictions Yes   RLE Weight Bearing Weight bearing as tolerated   Balance Screen   Has the patient fallen in the past 6 months Yes   How many times? 1  falling over tree root   Has the patient had a decrease in activity level because of a fear of falling?  Yes   Is the patient reluctant to leave their home because of a fear of falling?  No   Home Environment   Living Enviornment Private residence   Living Arrangements Children;Spouse/significant other   Type of [place] to enter   Entrance Stairs-Number of Steps [place] One level  Prior Function   Level of Independence Independent with basic ADLs;Independent with homemaking with ambulation;Independent with homemaking with wheelchair;Independent with gait;Independent with transfers   Cognition   Overall Cognitive Status Within Functional Limits for tasks assessed   Observation/Other Assessments   Skin Integrity Scar well healed on R ankle   Lower Extremity Functional Scale  50 % limitation   Squat   Comments unable to perform   AROM   Right Knee Extension 0   Right Knee Flexion 130   Left Knee Extension 0   Left Knee Flexion 130   Right Ankle Dorsiflexion 0   Right Ankle Plantar Flexion 20   Right Ankle Inversion 15   Right Ankle Eversion 9   Left Ankle Dorsiflexion 18   Left Ankle Plantar Flexion 55   Left Ankle Inversion 32   Left Ankle Eversion 21   Strength   Right Hip Flexion 5/5   Right Hip ABduction 4/5    Left Hip Flexion 5/5   Left Hip ABduction 4+/5   Right Knee Flexion 4/5   Right Knee Extension 4/5   Left Knee Flexion 4+/5   Left Knee Extension 4+/5   Right Ankle Dorsiflexion 3-/5   Right Ankle Plantar Flexion 3-/5   Right Ankle Inversion 3-/5   Right Ankle Eversion 3-/5   Left Ankle Dorsiflexion 5/5   Left Ankle Plantar Flexion 5/5   Left Ankle Inversion 5/5   Left Ankle Eversion 5/5   Ambulation/Gait   Ambulation/Gait Yes   Ambulation/Gait Assistance 7: Independent   Ambulation Distance (Feet) 100 Feet   Assistive device None   Gait Pattern Step-to pattern;Right circumduction   Ambulation Surface Level   Gait Comments Pt in R CAM walking boot                   [facility] Adult PT Treatment/Exercise - 03/28/14 0001    Modalities   Modalities Cryotherapy   Cryotherapy   Number Minutes Cryotherapy 15 Minutes   Cryotherapy Location Ankle  right   Type of Cryotherapy --  vasopneumatic   Manual Therapy   Manual Therapy Passive ROM   Passive ROM Ankle ROM in all planes to  pt tolerance   Ankle Exercises: Seated   Ankle Circles/Pumps AROM;20 reps   Ankle Circles/Pumps Limitations Dorsiflexion limitation to 0   Other Seated Ankle Exercises red t band strrength in all planes  20 each with VC                PT Education - 03/28/14 1905    Education provided Yes   Education Details Cryotherapy and positioning for decreasing swellling,  Initial HEP with strengthening exercise. AROM circles and T band exercises for ankle    Person(s) Educated Patient   Methods Explanation;Demonstration;Handout;Tactile cues;Verbal cues   Comprehension Verbalized understanding;Returned demonstration;Need further instruction             PT Long Term Goals - 03/28/14 1909    PT LONG TERM GOAL #1   Title Pt will be independent with advanced Ankle strengthening and ROM/proprioception exercises   Baseline no knowledge   Time 6   Period Weeks   Status New   PT LONG TERM  GOAL #2   Title Pt will have 1/10 pain or less to complete  full day of work without CAM boot   Baseline 5/10 pain especially after working and at night   Time 6   Period Weeks   Status New   PT LONG TERM GOAL #  3   Title Pt will increase AROM in Right ankle at least +5 DF and + 15 PF for increased to normalize gait without CAM boot   Baseline Dorsiflexion on R is 0 degrees, PF is 20 degrees   Time 6   Period Weeks   Status New   PT LONG TERM GOAL #4   Title Improve LEFS from 50% limitation to at least 25% limitation   Baseline LEFS 50 % limitation   Time 6   Period Weeks   Status New   PT LONG TERM GOAL #5   Title Pt will be able to perform single limb heel raises at least 15 times with UE support without fatigue   Baseline Unable to perform 1 heel raise   Time 6   Period Weeks   Status New               Plan - 03/28/14 1212    Clinical Impression Statement 39yo female with Right Trimalleolar fx w/o fixation of posterior malleolus CPT 27822 and CPT [id number]. presents with edema and abnormal gait and decreased ROM and strength.  Pt would benefit from Rehab for ankle but pt on Medicaid  and can only has 3 approved additional visit.  Will see pt biweekly for 6 weeks to maximize function before returning to MD..  Pt with abnormal gait due to R ankle weakness and decreased AROM of DF only to 0 degrees.  Pt is trying to work  presently and experiences ankle swelling while up on feet.  Pt with Plantarflex weakness on Right . Unable to perform heel raise. Pt  figure 8 ankle measurement is  2.5 cm greater on Right than left.  Will benefit from additional visits to prevent chronic problems.  Pt is very receptive and motivated to maximize potential   Pt will benefit from skilled therapeutic intervention in order to improve on the following deficits Abnormal gait;Decreased activity tolerance;Decreased mobility;Decreased endurance;Decreased range of motion;Decreased scar mobility;Decreased  strength;Increased edema;Increased fascial restricitons;Pain;Postural dysfunction;Impaired flexibility   Rehab Potential Good   PT Frequency Biweekly   PT Duration 6 weeks   PT Treatment/Interventions ADLs/Self Care Home Management;Cryotherapy;Moist Heat;Ultrasound;Functional mobility training;Neuromuscular re-education;Therapeutic exercise;Therapeutic activities;Manual techniques;Passive range of motion;Scar mobilization;Patient/family education;Stair training;Gait training  ionto   PT Next Visit Plan progress with closed chain stregthening as pt can tolerate.  Pt   Consulted and Agree with Plan of Care Patient         Problem List Patient Active Problem List   Diagnosis Date Noted  . Trimalleolar fracture of right ankle 02/02/2014  . DVT (deep venous thrombosis) 05/01/2013  . Acute DVT (deep venous thrombosis) 05/01/2013  . ASTHMA 02/04/2007  . Phill Myron REFLUX 02/04/2007    Voncille Lo, PT 03/28/2014 7:29 PM Phone: [phone] Fax: [phone]  [place], [place], [address] Phone: [phone]   Fax:  [phone]  By signing I understand that I am ordering/authorizing the use of Iontophoresis using 4 mg/mL of dexamethasone as a component of this plan of care.

## 2014-03-28 NOTE — Patient Instructions (Addendum)
Cryotherapy Cryotherapy means treatment with cold. Ice or gel packs can be used to reduce both pain and swelling. Ice is the most helpful within the first 24 to 48 hours after an injury or flare-up from overusing a muscle or joint. Sprains, strains, spasms, burning pain, shooting pain, and aches can all be eased with ice. Ice can also be used when recovering from surgery. Ice is effective, has very few side effects, and is safe for most people to use. PRECAUTIONS  Ice is not a safe treatment option for people with:  Raynaud phenomenon. This is a condition affecting small blood vessels in the extremities. Exposure to cold may cause your problems to return.  Cold hypersensitivity. There are many forms of cold hypersensitivity, including:  Cold urticaria. Red, itchy hives appear on the skin when the tissues begin to warm after being iced.  Cold erythema. This is a red, itchy rash caused by exposure to cold.  Cold hemoglobinuria. Red blood cells break down when the tissues begin to warm after being iced. The hemoglobin that carry oxygen are passed into the urine because they cannot combine with blood proteins fast enough.  Numbness or altered sensitivity in the area being iced. If you have any of the following conditions, do not use ice until you have discussed cryotherapy with your caregiver:  Heart conditions, such as arrhythmia, angina, or chronic heart disease.  High blood pressure.  Healing wounds or open skin in the area being iced.  Current infections.  Rheumatoid arthritis.  Poor circulation.  Diabetes. Ice slows the blood flow in the region it is applied. This is beneficial when trying to stop inflamed tissues from spreading irritating chemicals to surrounding tissues. However, if you expose your skin to cold temperatures for too long or without the proper protection, you can damage your skin or nerves. Watch for signs of skin damage due to cold. HOME CARE INSTRUCTIONS Follow  these tips to use ice and cold packs safely.  Place a dry or damp towel between the ice and skin. A damp towel will cool the skin more quickly, so you may need to shorten the time that the ice is used.  For a more rapid response, add gentle compression to the ice.  Ice for no more than 10 to 20 minutes at a time. The bonier the area you are icing, the less time it will take to get the benefits of ice.  Check your skin after 5 minutes to make sure there are no signs of a poor response to cold or skin damage.  Rest 20 minutes or more between uses.  Once your skin is numb, you can end your treatment. You can test numbness by very lightly touching your skin. The touch should be so light that you do not see the skin dimple from the pressure of your fingertip. When using ice, most people will feel these normal sensations in this order: cold, burning, aching, and numbness.  Do not use ice on someone who cannot communicate their responses to pain, such as small children or people with dementia. HOW TO MAKE AN ICE PACK Ice packs are the most common way to use ice therapy. Other methods include ice massage, ice baths, and cryosprays. Muscle creams that cause a cold, tingly feeling do not offer the same benefits that ice offers and should not be used as a substitute unless recommended by your caregiver. To make an ice pack, do one of the following:  Place crushed ice or a  bag of frozen vegetables in a sealable plastic bag. Squeeze out the excess air. Place this bag inside another plastic bag. Slide the bag into a pillowcase or place a damp towel between your skin and the bag.  Mix 3 parts water with 1 part rubbing alcohol. Freeze the mixture in a sealable plastic bag. When you remove the mixture from the freezer, it will be slushy. Squeeze out the excess air. Place this bag inside another plastic bag. Slide the bag into a pillowcase or place a damp towel between your skin and the bag. SEEK MEDICAL CARE  IF:  You develop white spots on your skin. This may give the skin a blotchy (mottled) appearance.  Your skin turns blue or pale.  Your skin becomes waxy or hard.  Your swelling gets worse. MAKE SURE YOU:   Understand these instructions.  Will watch your condition.  Will get help right away if you are not doing well or get worse. Document Released: 08/26/2010 Document Revised: 05/16/2013 Document Reviewed: 08/26/2010 [facility] Patient Information 2015 [place], [place]. This information is not intended to replace advice given to you by your health care provider. Make sure you discuss any questions you have with your health care provider.  Use ice at night and elevate Right leg for better control of pain before going to bed. Also make sure you elevate R LE above heart level to decrease swelling.  Inversion: Resisted   Cross legs with right leg underneath, foot in tubing loop. Hold tubing around other foot to resist and turn foot in. Repeat __10__ times per set. Do 3____ sets per session. Do __1-2__ sessions per day.  http://orth.exer.us/12   Copyright  VHI. All rights reserved.  Eversion: Resisted   With right foot in tubing loop, hold tubing around other foot to resist and turn foot out. Repeat __10__ times per set. Do 3___ sets per session. Do __1-2__ sessions per day.  http://orth.exer.us/14   Copyright  VHI. All rights reserved.  Plantar Flexion: Resisted   Anchor behind, tubing around left foot, press down. Repeat __10__ times per set. Do _3___ sets per session. Do ___1-2_ sessions per day.  http://orth.exer.us/10   Copyright  VHI. All rights reserved.  Dorsiflexion: Resisted   Facing anchor, tubing around left foot, pull toward face.  Repeat _10___ times per set. Do _3___ sets per session. Do _1-2___ sessions per day.  http://orth.exer.us/8   Copyright  VHI. All rights reserved.  May also do circles with ankle 10 times in both directions

## 2014-04-13 ENCOUNTER — Other Ambulatory Visit: Payer: Medicaid Other

## 2014-04-13 ENCOUNTER — Ambulatory Visit: Payer: Medicaid Other | Admitting: Physical Therapy

## 2014-04-18 ENCOUNTER — Ambulatory Visit: Payer: Medicaid Other | Attending: Orthopaedic Surgery | Admitting: Physical Therapy

## 2014-04-18 DIAGNOSIS — Z86718 Personal history of other venous thrombosis and embolism: Secondary | ICD-10-CM | POA: Insufficient documentation

## 2014-04-18 DIAGNOSIS — J45909 Unspecified asthma, uncomplicated: Secondary | ICD-10-CM | POA: Insufficient documentation

## 2014-04-18 DIAGNOSIS — K219 Gastro-esophageal reflux disease without esophagitis: Secondary | ICD-10-CM | POA: Insufficient documentation

## 2014-04-18 DIAGNOSIS — S82851D Displaced trimalleolar fracture of right lower leg, subsequent encounter for closed fracture with routine healing: Secondary | ICD-10-CM | POA: Insufficient documentation

## 2014-04-25 ENCOUNTER — Ambulatory Visit: Payer: Medicaid Other | Admitting: Physical Therapy

## 2014-04-25 DIAGNOSIS — Z86718 Personal history of other venous thrombosis and embolism: Secondary | ICD-10-CM | POA: Diagnosis not present

## 2014-04-25 DIAGNOSIS — R600 Localized edema: Secondary | ICD-10-CM

## 2014-04-25 DIAGNOSIS — M25674 Stiffness of right foot, not elsewhere classified: Secondary | ICD-10-CM

## 2014-04-25 DIAGNOSIS — R262 Difficulty in walking, not elsewhere classified: Secondary | ICD-10-CM

## 2014-04-25 DIAGNOSIS — M6281 Muscle weakness (generalized): Secondary | ICD-10-CM

## 2014-04-25 DIAGNOSIS — S82851D Displaced trimalleolar fracture of right lower leg, subsequent encounter for closed fracture with routine healing: Secondary | ICD-10-CM | POA: Diagnosis present

## 2014-04-25 DIAGNOSIS — S82851E Displaced trimalleolar fracture of right lower leg, subsequent encounter for open fracture type I or II with routine healing: Secondary | ICD-10-CM

## 2014-04-25 DIAGNOSIS — K219 Gastro-esophageal reflux disease without esophagitis: Secondary | ICD-10-CM | POA: Diagnosis not present

## 2014-04-25 DIAGNOSIS — J45909 Unspecified asthma, uncomplicated: Secondary | ICD-10-CM | POA: Diagnosis not present

## 2014-04-25 NOTE — Patient Instructions (Addendum)
PLANTARFLEXION STRENGTHENING:   Balancing ActANKLE: Plantarflexion, Bilateral - Standing   Stand with upright posture. Raise heels up as high as possible. _20 reps per set, _1_-2_ sets per day, 7___ days per week Hold onto a support.  Heel Raise: Bilateral (Standing)   Rise on balls of feet. Repeat _20___ times per set. Do 1-2____ sets per session. Do _1___ sessions per day.  http://orth.[email or web address].us/38   Heel Raise: Standing   Toes on board, heels on floor, knees slightly bent, rise up on toes as high as possible. Do __1__ sets. Complete _20___ repetitions. Go to good comfortable stretch.  http://st.exer.us/132    Heel Raise: Unilateral (Standing)   Balance on left foot, then rise on ball of foot. Repeat ___3_ times per set. Do __1__ sets per session. Do _1___ sessions per day. When brushing teetch  http://orth.exer.us/40   FUNCTIONAL MOBILITY: Toe Walking   Walk forward on toes. _2 x10__ reps per set,  1-2 times a day   Use counter with arm support if necessary   DORSIFLEXION STRENGTHENING:  Toe Raise (Standing)   Rock back on heels. Repeat ___2 x10_ times per set. Do 1-2 times a day  http://orth.[email or web address].us/42   FUNCTIONAL MOBILITY: Heel Walking   Walk forward on heels. __2 x 10_ reps  Use counter for hold  Body-Weight Step-Up: Stable (Active)   Head up, back flat, place one foot on step. Bring other leg up on to step. Complete __3_ sets of _10__ repetitions. Perform _1__ sessions per day.  Also step up laterally on Right side.  Same as above  http://[email or web address].exer.[email or web address]     http://orth.exer.us/734   Copyright  VHI. All rights reserved.  Strengthening: Wall Slide   Leaning on wall, slowly lower buttocks until thighs are parallel to floor. Hold _5_ seconds. Tighten thigh muscles and return. Repeat _2 x10__ times per set. Do __1__ sets per session. Do 1-2____ sessions per day.  http://orth.exer.us/630    Bridge [employer] small of back into  mat, maintain pelvic tilt, roll up one vertebrae at a time. Focus on engaging posterior hip muscles. Hold for _30  Repeat ___3_ times.     Copyright  VHI. All rights reserved.  Voncille Lo, PT 04/25/2014 11:14 AM Phone: [phone] Fax: [phone]

## 2014-04-25 NOTE — Therapy (Addendum)
[place], [place], [address] Phone: [phone]   Fax:  [phone]  Physical Therapy Treatment/Discharge Note  Patient Details  Name: Linda Dalton MRN: [id number] Date of Birth: 1975/02/28 Referring Provider:  Leandrew Koyanagi, MD  Encounter Date: 04/25/2014      PT End of Session - 04/25/14 1104    Visit Number 2   Number of Visits 4   Date for PT Re-Evaluation 05/09/14   PT Start Time 1103   PT Stop Time 1208   PT Time Calculation (min) 65 min   Activity Tolerance Patient tolerated treatment well   Behavior During Therapy [facility] for tasks assessed/performed      Past Medical History  Diagnosis Date  . Asthma   . Fibroid tumor   . PONV (postoperative nausea and vomiting)   . DVT (deep venous thrombosis)   . Presence of IVC filter     Past Surgical History  Procedure Laterality Date  . Breast surgery    . Abdominal hysterectomy    . Tubal ligation    . Orif ankle fracture Right 02/02/2014    Procedure: OPEN REDUCTION INTERNAL FIXATION (ORIF) RIGHT TRIMALLEOLAR ANKLE FRACTURE;  Surgeon: Marianna Payment, MD;  Location: [place];  Service: Orthopedics;  Laterality: Right;    There were no vitals filed for this visit.  Visit Diagnosis:  Trimalleolar fracture, right, open type I or II, with routine healing, subsequent encounter  Difficulty walking  Edema  Decreased ROM of right foot  Decreased muscle strength      Subjective Assessment - 04/25/14 1104    Subjective Pt enters clinic without assistive device.  Feels like pins and needles on you heels (medial heel to ankle). today seems worse due to the weather.   Pertinent History Pt fell over tree root on1-19-16   How long can you sit comfortably? unlimited   How long can you stand comfortably? stands for 5 hours with pain    How long can you walk comfortably? 15 -20 minutes   Patient Stated Goals I would love to get back to  running and dance   Currently in Pain? Yes   Pain Score 5    Pain Location Ankle   Pain Orientation Right  medial   Pain Descriptors / Indicators Tingling;Numbness;Aching   Pain Type Chronic pain   Pain Onset More than a month ago   Pain Frequency Intermittent   Pain Relieving Factors ice            [facility] PT Assessment - 04/25/14 0001    AROM   Right Ankle Dorsiflexion 9   Right Ankle Plantar Flexion 41   Right Ankle Inversion 29   Right Ankle Eversion 14   Strength   Right Ankle Dorsiflexion 4-/5   Right Ankle Plantar Flexion 4-/5   Right Ankle Inversion 3/5   Right Ankle Eversion 3/5   Ambulation/Gait   Ambulation/Gait Yes   Ambulation/Gait Assistance 7: Independent   Ambulation Distance (Feet) 100 Feet   Assistive device None   Gait Pattern Decreased stance time - right;Decreased step length - left;Antalgic   Ambulation Surface Level   Gait velocity 3.5 ft/sec   Gait Comments Pt without boot now                   [facility] Adult PT Treatment/Exercise - 04/25/14 0001    Lumbar Exercises: Supine   Bridge 10 reps  x2  and with 2 x  30 second hold articulating bridge   Knee/Hip Exercises: Standing   Lateral Step Up Right;2 sets;10 reps   Forward Step Up Right;2 sets;10 reps   SLS 3 x 15 seconds without loss of balance   Modalities   Modalities Cryotherapy   Cryotherapy   Number Minutes Cryotherapy 15 Minutes   Cryotherapy Location Ankle  right   Type of Cryotherapy --  Game ready   Manual Therapy   Manual Therapy Passive ROM;Joint mobilization   Joint Mobilization Talo crural mobs/ EV/IN mobs   Passive ROM ankle ROM over pressure and scar tissue massage   Ankle Exercises: Standing   Rocker Board 2 minutes   Heel Raises 15 reps   Heel Raises Limitations Left 5 inches /Left 2 inches bil and single limb raise   Toe Raise 15 reps   Toe Raise Limitations bil and single limb raise while holding onto counter   Heel Walk (Round Trip)  40 feet   Toe Walk  (Round Trip) 40 feet   Other Standing Ankle Exercises gastroc stretch over step bilaterally 30 second hold                PT Education - 04/25/14 1136    Education provided Yes   Education Details HEP for standing ankle strength and LE strength with handout   Person(s) Educated Patient   Methods Explanation;Demonstration;Tactile cues;Verbal cues;Handout   Comprehension Returned demonstration;Verbalized understanding;Need further instruction             PT Long Term Goals - 04/25/14 1139    PT LONG TERM GOAL #1   Title Pt will be independent with advanced Ankle strengthening and ROM/proprioception exercises   Baseline no knowledge   Time 6   Period Weeks   Status On-going   PT LONG TERM GOAL #2   Title Pt will have 1/10 pain or less to complete  full day of work without CAM boot  5/10   Baseline 5/10 pain especially after working and at night   Status On-going   PT LONG TERM GOAL #3   Title Pt will increase AROM in Right ankle at least +5 DF and + 15 PF for increased to normalize gait without CAM boot   Time 6   Period Weeks   Status Achieved   PT LONG TERM GOAL #4   Title Improve LEFS from 50% limitation to at least 25% limitation   Time 6   Period Weeks   Status On-going   PT LONG TERM GOAL #5   Title Pt will be able to perform single limb heel raises at least 15 times with UE support without fatigue   Baseline Pt with single R heel raise 2 inch, L 5 inches   Time 6   Period Weeks   Status On-going               Plan - 04/25/14 1151    Clinical Impression Statement 39 yo female returns to clinic without use of assistive device or CAM boot.  Pt walks with antalgic gait and decreased stance time on Right and decreased stride length. Pt initially 5/10 and a 2/10 after treatment.  Pt increased MMT in Right ankle 1/2 grade.  Pt is independent with initial HEP and moving to stnading [facility].     Pt will benefit from skilled therapeutic intervention in order  to improve on the following deficits Abnormal gait;Decreased activity tolerance;Decreased mobility;Decreased endurance;Decreased range of motion;Decreased scar mobility;Decreased strength;Increased edema;Increased fascial restricitons;Pain;Postural dysfunction;Impaired flexibility  PT Next Visit Plan Add single limb bridge,  increase BAPS board and unlevel surface and balance, try to include high level balance activities and rebounder.  Check AROM/Strength and goals.        Problem List Patient Active Problem List   Diagnosis Date Noted  . Trimalleolar fracture of right ankle 02/02/2014  . DVT (deep venous thrombosis) 05/01/2013  . Acute DVT (deep venous thrombosis) 05/01/2013  . ASTHMA 02/04/2007  . Phill Myron REFLUX 02/04/2007    Voncille Lo, PT 04/25/2014 12:36 PM Phone: [phone] Fax: [facility] [address] [place], [place], [address] Phone: [phone]   Fax:  [phone]  PHYSICAL THERAPY DISCHARGE SUMMARY  Visits from Start of Care: 2  Current functional level related to goals / functional outcomes: See above   Remaining deficits: Unknown since pt did not return for 2 additional visits   Education / Equipment: HEP  Plan:                                                    Patient goals were partially met. Patient is being discharged due to not returning since the last visit.  ?????        Voncille Lo, PT 10/17/2014 3:24 PM Phone: [phone] Fax: [phone]

## 2014-05-09 ENCOUNTER — Ambulatory Visit: Payer: Medicaid Other | Admitting: Physical Therapy

## 2014-12-12 ENCOUNTER — Emergency Department (HOSPITAL_COMMUNITY): Payer: Medicaid Other

## 2014-12-12 ENCOUNTER — Encounter (HOSPITAL_COMMUNITY): Payer: Self-pay | Admitting: Emergency Medicine

## 2014-12-12 ENCOUNTER — Emergency Department (HOSPITAL_COMMUNITY)
Admission: EM | Admit: 2014-12-12 | Discharge: 2014-12-12 | Disposition: A | Discharge status: Home / Self Care | Payer: Medicaid Other | Attending: Emergency Medicine | Admitting: Emergency Medicine

## 2014-12-12 DIAGNOSIS — Z86018 Personal history of other benign neoplasm: Secondary | ICD-10-CM | POA: Insufficient documentation

## 2014-12-12 DIAGNOSIS — Z79899 Other long term (current) drug therapy: Secondary | ICD-10-CM | POA: Insufficient documentation

## 2014-12-12 DIAGNOSIS — Z86718 Personal history of other venous thrombosis and embolism: Secondary | ICD-10-CM | POA: Insufficient documentation

## 2014-12-12 DIAGNOSIS — Z87891 Personal history of nicotine dependence: Secondary | ICD-10-CM | POA: Insufficient documentation

## 2014-12-12 DIAGNOSIS — J159 Unspecified bacterial pneumonia: Secondary | ICD-10-CM | POA: Insufficient documentation

## 2014-12-12 DIAGNOSIS — J45901 Unspecified asthma with (acute) exacerbation: Secondary | ICD-10-CM | POA: Insufficient documentation

## 2014-12-12 DIAGNOSIS — J189 Pneumonia, unspecified organism: Secondary | ICD-10-CM

## 2014-12-12 LAB — CBC WITH DIFFERENTIAL/PLATELET
BASOS ABS: 0.1 10*3/uL (ref 0.0–0.1)
BASOS PCT: 1 %
EOS ABS: 1.1 10*3/uL — AB (ref 0.0–0.7)
Eosinophils Relative: 17 %
HCT: 40.9 % (ref 36.0–46.0)
HEMOGLOBIN: 13.8 g/dL (ref 12.0–15.0)
Lymphocytes Relative: 46 %
Lymphs Abs: 2.8 10*3/uL (ref 0.7–4.0)
MCH: 31 pg (ref 26.0–34.0)
MCHC: 33.7 g/dL (ref 30.0–36.0)
MCV: 91.9 fL (ref 78.0–100.0)
Monocytes Absolute: 0.5 10*3/uL (ref 0.1–1.0)
Monocytes Relative: 7 %
Neutro Abs: 1.8 10*3/uL (ref 1.7–7.7)
Neutrophils Relative %: 29 %
Platelets: 217 10*3/uL (ref 150–400)
RBC: 4.45 MIL/uL (ref 3.87–5.11)
RDW: 13.8 % (ref 11.5–15.5)
WBC: 6.1 10*3/uL (ref 4.0–10.5)

## 2014-12-12 LAB — BASIC METABOLIC PANEL
Anion gap: 6 (ref 5–15)
BUN: 8 mg/dL (ref 6–20)
CALCIUM: 8.9 mg/dL (ref 8.9–10.3)
CO2: 24 mmol/L (ref 22–32)
CREATININE: 0.65 mg/dL (ref 0.44–1.00)
Chloride: 107 mmol/L (ref 101–111)
GFR calc Af Amer: >60 mL/min (ref >60)
GFR calc non Af Amer: >60 mL/min (ref >60)
Glucose, Bld: 89 mg/dL (ref 65–99)
Potassium: 4.4 mmol/L (ref 3.5–5.1)
SODIUM: 137 mmol/L (ref 135–145)

## 2014-12-12 MED ORDER — METHYLPREDNISOLONE SODIUM SUCC 125 MG IJ SOLR
125.0000 mg | Freq: Once | INTRAMUSCULAR | Status: AC
  Administered 2014-12-12: 125 mg via INTRAVENOUS
  Filled 2014-12-12: qty 2

## 2014-12-12 MED ORDER — DOXYCYCLINE HYCLATE 100 MG PO CAPS: 100.0000 mg | ORAL_CAPSULE | Freq: Two times a day (BID) | ORAL | Status: DC

## 2014-12-12 MED ORDER — IPRATROPIUM BROMIDE 0.02 % IN SOLN
0.5000 mg | Freq: Once | RESPIRATORY_TRACT | Status: AC
  Administered 2014-12-12: 0.5 mg via RESPIRATORY_TRACT
  Filled 2014-12-12: qty 2.5

## 2014-12-12 MED ORDER — SODIUM CHLORIDE 0.9 % IV BOLUS (SEPSIS)
1000.0000 mL | Freq: Once | INTRAVENOUS | Status: AC
  Administered 2014-12-12: 1000 mL via INTRAVENOUS

## 2014-12-12 MED ORDER — ALBUTEROL SULFATE HFA 108 (90 BASE) MCG/ACT IN AERS: 1.0000 | INHALATION_SPRAY | RESPIRATORY_TRACT | Status: DC | PRN

## 2014-12-12 MED ORDER — ALBUTEROL SULFATE (2.5 MG/3ML) 0.083% IN NEBU
2.5000 mg | INHALATION_SOLUTION | Freq: Once | RESPIRATORY_TRACT | Status: AC
  Administered 2014-12-12: 2.5 mg via RESPIRATORY_TRACT
  Filled 2014-12-12: qty 3

## 2014-12-12 NOTE — ED Provider Notes (Signed)
CSN: [id number]     Arrival date & time 12/12/14  1002 History   First MD Initiated Contact with Patient 12/12/14 1107     Chief Complaint  Patient presents with  . Asthma  . Cough     (Consider location/radiation/quality/duration/timing/severity/associated sxs/prior Treatment) HPI   Linda Dalton is a 39 y.o. female with PMH significant for asthma, DVT w/ IVC filter placement who presents with gradual, constant, moderate worsening cough and chest tightness.  Patient reports 5 days ago she began having cold symptoms including cough and nasal congestion.  She took OTC medications with mild relief, but noticed associated chest tightness and SOB that was unrelieved with her inhaler.  No aggravating factors.  Denies otalgia, sore throat, neck stiffness, N/V, abdominal pain, or CP.  She is a former smoker.   Past Medical History  Diagnosis Date  . Asthma   . Fibroid tumor   . PONV (postoperative nausea and vomiting)   . DVT (deep venous thrombosis) ([place])   . Presence of IVC filter    Past Surgical History  Procedure Laterality Date  . Breast surgery    . Abdominal hysterectomy    . Tubal ligation    . Orif ankle fracture Right 02/02/2014    Procedure: OPEN REDUCTION INTERNAL FIXATION (ORIF) RIGHT TRIMALLEOLAR ANKLE FRACTURE;  Surgeon: Marianna Payment, MD;  Location: [facility];  Service: Orthopedics;  Laterality: Right;   Family History  Problem Relation Age of Onset  . Congestive Heart Failure Mother   . Diabetes Mother   . Asthma Mother   . Hypertension Mother   . Asthma Father   . Sleep apnea Father   . Hypercholesterolemia Father   . Heart murmur Sister   . Heart disease Brother   . Cancer Other   . Congestive Heart Failure Other    Social History  Substance Use Topics  . Smoking status: Former Smoker -- 1.00 packs/day for 8 years    Types: Cigarettes    Quit date: 04/22/2013  . Smokeless tobacco: Never Used  . Alcohol Use: Yes     Comment: rare   OB  History    No data available     Review of Systems All other systems negative unless otherwise stated in HPI    Allergies  Biaxin and Codeine  Home Medications   Prior to Admission medications   Medication Sig Start Date End Date Taking? Authorizing Provider  albuterol (PROVENTIL HFA;VENTOLIN HFA) 108 (90 BASE) MCG/ACT inhaler Inhale 1-2 puffs into the lungs every 4 (four) hours as needed for wheezing or shortness of breath. 12/12/14   Gloriann Loan, PA-C  calcium carbonate (TUMS - DOSED IN MG ELEMENTAL CALCIUM) 500 MG chewable tablet Chew 1 tablet by mouth daily.    Historical Provider, MD  doxycycline (VIBRAMYCIN) 100 MG capsule Take 1 capsule (100 mg total) by mouth 2 (two) times daily. 12/12/14   Jourdon Zimmerle, PA-C  enoxaparin (LOVENOX) 40 MG/0.4ML injection Inject 0.4 mLs (40 mg total) into the skin daily. Patient not taking: Reported on 03/28/2014 02/02/14   Leandrew Koyanagi, MD  ferrous sulfate 325 (65 FE) MG tablet Take 325 mg by mouth 3 (three) times daily with meals.    Historical Provider, MD  ondansetron (ZOFRAN ODT) 8 MG disintegrating tablet Take 1 tablet (8 mg total) by mouth every 8 (eight) hours as needed for nausea or vomiting. Patient not taking: Reported on 01/26/2014 11/02/13   Jola Schmidt, MD  oxyCODONE (OXY IR/ROXICODONE) 5 MG  immediate release tablet Take 1-3 tablets (5-15 mg total) by mouth every 4 (four) hours as needed. Patient not taking: Reported on 03/28/2014 02/02/14   Leandrew Koyanagi, MD  oxyCODONE-acetaminophen (PERCOCET/ROXICET) 5-325 MG per tablet Take 1-2 tablets by mouth every 6 (six) hours as needed. Patient not taking: Reported on 03/28/2014 01/26/14   Davonna Belling, MD  traMADol (ULTRAM) 50 MG tablet Take 1 tablet (50 mg total) by mouth every 6 (six) hours as needed. Patient not taking: Reported on 01/26/2014 11/02/13   Jola Schmidt, MD   BP 116/88 mmHg  Pulse 91  Temp(Src) 98.8 F (37.1 C) (Oral)  Resp 18  SpO2 97%  LMP 03/15/2013 Physical Exam   Constitutional: She is oriented to person, place, and time. She appears well-developed and well-nourished.  HENT:  Head: Normocephalic and atraumatic.  Mouth/Throat: Oropharynx is clear and moist.  Eyes: Conjunctivae are normal. Pupils are equal, round, and reactive to light.  Neck: Normal range of motion. Neck supple.  Cardiovascular: Normal rate, regular rhythm and normal heart sounds.   No murmur heard. Pulmonary/Chest: Effort normal. No accessory muscle usage or stridor. No respiratory distress. She has no decreased breath sounds. She has wheezes. She has rhonchi. She has no rales.  Abdominal: Soft. Bowel sounds are normal. She exhibits no distension. There is no tenderness.  Musculoskeletal: Normal range of motion.  Lymphadenopathy:    She has no cervical adenopathy.  Neurological: She is alert and oriented to person, place, and time.  Speech clear without dysarthria.  Skin: Skin is warm and dry.  Psychiatric: She has a normal mood and affect. Her behavior is normal.    ED Course  Procedures (including critical care time) Labs Review Labs Reviewed  CBC WITH DIFFERENTIAL/PLATELET - Abnormal; Notable for the following:    Eosinophils Absolute 1.1 (*)    All other components within normal limits  BASIC METABOLIC PANEL    Imaging Review Dg Chest 2 View  12/12/2014  CLINICAL DATA:  39 year old with acute asthma exacerbation, cough and chest congestion over the past 5 days, chest pain while coughing. EXAM: CHEST  2 VIEW COMPARISON:  05/04/2013, 05/01/2013. FINDINGS: Cardiomediastinal silhouette unremarkable, unchanged. Streaky and patchy airspace opacities in the medial right lower lobe. Lungs otherwise clear. Bronchovascular markings normal. No localized airspace consolidation. No pleural effusions. No pneumothorax. Normal pulmonary vascularity. Visualized bony thorax intact. IMPRESSION: Acute right lower lobe bronchopneumonia. Electronically Signed   By: Evangeline Dakin M.D.   On:  12/12/2014 11:38   I have personally reviewed and evaluated these images and lab results as part of my medical decision-making.   EKG Interpretation None      MDM   Final diagnoses:  CAP (community acquired pneumonia)    Patient with PMH of asthma presents with chest tightness and cough x 5 days.  VSS, NAD.  On exam, heart RRR, lungs with wheezes and rhonchi.  No rales or accessory muscle usage.  Abdomen soft and benign.  Will obtain CXR, CBC, BMP.  Will start fluids, solumedrol, atrovent, and albuterol. -CXR shows acute RLL bronchopneumonia.  Will treat with Doxycycline. -CBC and BMP unremarkable.  Evaluation does not show pathology requring ongoing emergent intervention or admission. Pt is hemodynamically stable and mentating appropriately. Discussed findings/results and plan with patient/guardian, who agrees with plan. All questions answered. Return precautions discussed and outpatient follow up given.   Case has been discussed with Dr. Laneta Simmers who agrees with the above plan for discharge.        Lonn Georgia  [place], PA-C 12/12/14 1247  Leo Grosser, MD 12/15/14 [phone]

## 2014-12-12 NOTE — ED Notes (Signed)
Patient transported to X-ray 

## 2014-12-12 NOTE — ED Notes (Signed)
Pt sts cough and chest congestion x 5 days worse asthma type sx x 2 days; pt sts home inhaler helping some; pt sts cough and tightness with cough; pt sts pain with cough

## 2014-12-12 NOTE — Discharge Instructions (Signed)

## 2014-12-25 ENCOUNTER — Emergency Department (HOSPITAL_COMMUNITY)
Admission: EM | Admit: 2014-12-25 | Discharge: 2014-12-25 | Disposition: A | Discharge status: Home / Self Care | Payer: Medicaid Other | Attending: Emergency Medicine | Admitting: Emergency Medicine

## 2014-12-25 ENCOUNTER — Encounter (HOSPITAL_COMMUNITY): Payer: Self-pay | Admitting: Emergency Medicine

## 2014-12-25 ENCOUNTER — Emergency Department (HOSPITAL_COMMUNITY): Payer: Medicaid Other

## 2014-12-25 DIAGNOSIS — J45901 Unspecified asthma with (acute) exacerbation: Secondary | ICD-10-CM

## 2014-12-25 DIAGNOSIS — Z87891 Personal history of nicotine dependence: Secondary | ICD-10-CM | POA: Insufficient documentation

## 2014-12-25 DIAGNOSIS — Z86718 Personal history of other venous thrombosis and embolism: Secondary | ICD-10-CM | POA: Insufficient documentation

## 2014-12-25 DIAGNOSIS — Z86018 Personal history of other benign neoplasm: Secondary | ICD-10-CM | POA: Insufficient documentation

## 2014-12-25 DIAGNOSIS — Z79899 Other long term (current) drug therapy: Secondary | ICD-10-CM | POA: Insufficient documentation

## 2014-12-25 MED ORDER — PREDNISONE 20 MG PO TABS: ORAL_TABLET | ORAL | Status: DC

## 2014-12-25 MED ORDER — PREDNISONE 20 MG PO TABS
60.0000 mg | ORAL_TABLET | Freq: Once | ORAL | Status: AC
  Administered 2014-12-25: 60 mg via ORAL
  Filled 2014-12-25: qty 3

## 2014-12-25 MED ORDER — AEROCHAMBER PLUS W/MASK MISC: Status: DC

## 2014-12-25 MED ORDER — IPRATROPIUM BROMIDE HFA 17 MCG/ACT IN AERS
2.0000 | INHALATION_SPRAY | RESPIRATORY_TRACT | Status: DC | PRN
Start: 2014-12-25 — End: 2015-01-24

## 2014-12-25 MED ORDER — IPRATROPIUM-ALBUTEROL 0.5-2.5 (3) MG/3ML IN SOLN
3.0000 mL | Freq: Once | RESPIRATORY_TRACT | Status: AC
Start: 2014-12-25 — End: 2014-12-25
  Administered 2014-12-25: 3 mL via RESPIRATORY_TRACT
  Filled 2014-12-25: qty 3

## 2014-12-25 MED ORDER — IPRATROPIUM-ALBUTEROL 0.5-2.5 (3) MG/3ML IN SOLN
3.0000 mL | Freq: Once | RESPIRATORY_TRACT | Status: AC
  Administered 2014-12-25: 3 mL via RESPIRATORY_TRACT
  Filled 2014-12-25: qty 3

## 2014-12-25 MED ORDER — ALBUTEROL SULFATE HFA 108 (90 BASE) MCG/ACT IN AERS: 1.0000 | INHALATION_SPRAY | RESPIRATORY_TRACT | Status: DC | PRN

## 2014-12-25 NOTE — ED Notes (Signed)
Pt c/o wheezing x 3 weeks. Pt seen here for same recently and was given a breathing treatment. Pt reports since breathing treatment she has been coughing.

## 2014-12-25 NOTE — ED Notes (Signed)
Pt is in stable condition upon d/c and ambulates from ED. 

## 2014-12-25 NOTE — ED Provider Notes (Signed)
CSN: [id number]     Arrival date & time 12/25/14  0915 History   First MD Initiated Contact with Patient 12/25/14 1001     Chief Complaint  Patient presents with  . Wheezing  . Cough     (Consider location/radiation/quality/duration/timing/severity/associated sxs/prior Treatment) HPI Patient is an asthmatic who has had cough and wheezing for almost 3 weeks. She reports it started with some URI-type symptoms and has never improved. She has taken course of doxycycline and had a rescue inhaler. She reports she was given a dose of steroids when she was seen in the emergency department on November 29 but did not have any further steroid treatment. She reports she is getting short of breath with minor activity and having constant coughing and wheezing. No fever, the cough is dry. She has a history of DVT. She reports she has chronic swelling in her left lower extremity and has not noted any changes. She does not endorse pleuritic type chest pain. Past Medical History  Diagnosis Date  . Asthma   . Fibroid tumor   . PONV (postoperative nausea and vomiting)   . DVT (deep venous thrombosis) ([place])   . Presence of IVC filter    Past Surgical History  Procedure Laterality Date  . Breast surgery    . Abdominal hysterectomy    . Tubal ligation    . Orif ankle fracture Right 02/02/2014    Procedure: OPEN REDUCTION INTERNAL FIXATION (ORIF) RIGHT TRIMALLEOLAR ANKLE FRACTURE;  Surgeon: Marianna Payment, MD;  Location: [facility];  Service: Orthopedics;  Laterality: Right;   Family History  Problem Relation Age of Onset  . Congestive Heart Failure Mother   . Diabetes Mother   . Asthma Mother   . Hypertension Mother   . Asthma Father   . Sleep apnea Father   . Hypercholesterolemia Father   . Heart murmur Sister   . Heart disease Brother   . Cancer Other   . Congestive Heart Failure Other    Social History  Substance Use Topics  . Smoking status: Former Smoker -- 1.00 packs/day for 8 years   Types: Cigarettes    Quit date: 04/22/2013  . Smokeless tobacco: Never Used  . Alcohol Use: Yes     Comment: rare   OB History    No data available     Review of Systems  10 Systems reviewed and are negative for acute change except as noted in the HPI.   Allergies  Biaxin and Codeine  Home Medications   Prior to Admission medications   Medication Sig Start Date End Date Taking? Authorizing Provider  albuterol (PROVENTIL HFA;VENTOLIN HFA) 108 (90 BASE) MCG/ACT inhaler Inhale 1-2 puffs into the lungs every 4 (four) hours as needed for wheezing or shortness of breath. 12/12/14  Yes Kayla Rose, PA-C  albuterol (PROVENTIL HFA;VENTOLIN HFA) 108 (90 BASE) MCG/ACT inhaler Inhale 1-2 puffs into the lungs every 4 (four) hours as needed for wheezing or shortness of breath. 12/25/14   Charlesetta Shanks, MD  calcium carbonate (TUMS - DOSED IN MG ELEMENTAL CALCIUM) 500 MG chewable tablet Chew 1 tablet by mouth daily.    Historical Provider, MD  doxycycline (VIBRAMYCIN) 100 MG capsule Take 1 capsule (100 mg total) by mouth 2 (two) times daily. Patient not taking: Reported on 12/25/2014 12/12/14   Gloriann Loan, PA-C  enoxaparin (LOVENOX) 40 MG/0.4ML injection Inject 0.4 mLs (40 mg total) into the skin daily. Patient not taking: Reported on 03/28/2014 02/02/14   Naiping Ephriam Jenkins,  MD  ferrous sulfate 325 (65 FE) MG tablet Take 325 mg by mouth 3 (three) times daily with meals.    Historical Provider, MD  ipratropium (ATROVENT HFA) 17 MCG/ACT inhaler Inhale 2 puffs into the lungs every 4 (four) hours as needed for wheezing. Use with albuterol every 4 hours for the next 3 days. 12/25/14   Charlesetta Shanks, MD  ondansetron (ZOFRAN ODT) 8 MG disintegrating tablet Take 1 tablet (8 mg total) by mouth every 8 (eight) hours as needed for nausea or vomiting. Patient not taking: Reported on 01/26/2014 11/02/13   Jola Schmidt, MD  oxyCODONE (OXY IR/ROXICODONE) 5 MG immediate release tablet Take 1-3 tablets (5-15 mg total) by  mouth every 4 (four) hours as needed. Patient not taking: Reported on 03/28/2014 02/02/14   Leandrew Koyanagi, MD  oxyCODONE-acetaminophen (PERCOCET/ROXICET) 5-325 MG per tablet Take 1-2 tablets by mouth every 6 (six) hours as needed. Patient not taking: Reported on 03/28/2014 01/26/14   Davonna Belling, MD  predniSONE (DELTASONE) 20 MG tablet 3 tabs po day one, then 2 po daily x 4 days 12/25/14   Charlesetta Shanks, MD  Spacer/Aero-Holding Chambers (AEROCHAMBER PLUS WITH MASK) inhaler Use as instructed 12/25/14   Charlesetta Shanks, MD  traMADol (ULTRAM) 50 MG tablet Take 1 tablet (50 mg total) by mouth every 6 (six) hours as needed. Patient not taking: Reported on 01/26/2014 11/02/13   Jola Schmidt, MD   BP 116/58 mmHg  Pulse 96  Temp(Src) 98.2 F (36.8 C) (Oral)  Resp 24  Ht 5\' 8"  (1.727 m)  Wt 174 lb (78.926 kg)  BMI 26.46 kg/m2  SpO2 100%  LMP 03/15/2012 Physical Exam  Constitutional: She is oriented to person, place, and time. She appears well-developed and well-nourished.  Frequent dry cough. She is nontoxic and alert.  HENT:  Head: Normocephalic and atraumatic.  Mouth/Throat: Oropharynx is clear and moist.  Eyes: EOM are normal. Pupils are equal, round, and reactive to light.  Neck: Neck supple.  Cardiovascular: Normal rate, regular rhythm, normal heart sounds and intact distal pulses.   Pulmonary/Chest: Effort normal. She has wheezes.  Patient is fine at [facility] wheeze throughout the lung fields. Also a decreased airflow at the bases. Roxanol dry cough with deep inspiration.  Abdominal: Soft. Bowel sounds are normal. She exhibits no distension. There is no tenderness.  Musculoskeletal: Normal range of motion. She exhibits no edema or tenderness.  Neurological: She is alert and oriented to person, place, and time. She has normal strength. Coordination normal. GCS eye subscore is 4. GCS verbal subscore is 5. GCS motor subscore is 6.  Skin: Skin is warm, dry and intact.  Psychiatric: She has a  normal mood and affect.    ED Course  Procedures (including critical care time) Labs Review Labs Reviewed - No data to display  Imaging Review Dg Chest 2 View  12/25/2014  CLINICAL DATA:  Increase shortness of breath for 10 days. EXAM: CHEST  2 VIEW COMPARISON:  12/12/2014 FINDINGS: The heart size and mediastinal contours are within normal limits. Both lungs are clear. The visualized skeletal structures are unremarkable. IMPRESSION: No active cardiopulmonary disease. Electronically Signed   By: Rolm Baptise M.D.   On: 12/25/2014 10:48   I have personally reviewed and evaluated these images and lab results as part of my medical decision-making.   EKG Interpretation None     Recheck 12:35 patient reports she feels much improved. Repeat auscultation is for improved air flow to the bases with continued extensive wheeze.  No respiratory distress at rest with clear mental status. MDM   Final diagnoses:  Asthma exacerbation   Patient with significant improvement. At this point she will be placed on a prednisone 5 day course. I will also add ipratropium and albuterol to use every 4-6 hours for the next 3 days. Patient is counseled to follow-up with her doctor to schedule follow-up with pulmonology. Returning if there should be any worsening or changing of wheezing, dyspnea, chest pain or fever    Charlesetta Shanks, MD 12/25/14 1335

## 2014-12-25 NOTE — ED Notes (Signed)
Patient transported to X-ray 

## 2014-12-25 NOTE — Discharge Instructions (Signed)
Asthma, Adult Asthma is a recurring condition in which the airways tighten and narrow. Asthma can make it difficult to breathe. It can cause coughing, wheezing, and shortness of breath. Asthma episodes, also called asthma attacks, range from minor to life-threatening. Asthma cannot be cured, but medicines and lifestyle changes can help control it. CAUSES Asthma is believed to be caused by inherited (genetic) and environmental factors, but its exact cause is unknown. Asthma may be triggered by allergens, lung infections, or irritants in the air. Asthma triggers are different for each person. Common triggers include:   Animal dander.  Dust mites.  Cockroaches.  Pollen from trees or grass.  Mold.  Smoke.  Air pollutants such as dust, household cleaners, hair sprays, aerosol sprays, paint fumes, strong chemicals, or strong odors.  Cold air, weather changes, and winds (which increase molds and pollens in the air).  Strong emotional expressions such as crying or laughing hard.  Stress.  Certain medicines (such as aspirin) or types of drugs (such as beta-blockers).  Sulfites in foods and drinks. Foods and drinks that may contain sulfites include dried fruit, potato chips, and sparkling grape juice.  Infections or inflammatory conditions such as the flu, a cold, or an inflammation of the nasal membranes (rhinitis).  Gastroesophageal reflux disease (GERD).  Exercise or strenuous activity. SYMPTOMS Symptoms may occur immediately after asthma is triggered or many hours later. Symptoms include:  Wheezing.  Excessive nighttime or early morning coughing.  Frequent or severe coughing with a common cold.  Chest tightness.  Shortness of breath. DIAGNOSIS  The diagnosis of asthma is made by a review of your medical history and a physical exam. Tests may also be performed. These may include:  Lung function studies. These tests show how much air you breathe in and out.  Allergy  tests.  Imaging tests such as X-rays. TREATMENT  Asthma cannot be cured, but it can usually be controlled. Treatment involves identifying and avoiding your asthma triggers. It also involves medicines. There are 2 classes of medicine used for asthma treatment:   Controller medicines. These prevent asthma symptoms from occurring. They are usually taken every day.  Reliever or rescue medicines. These quickly relieve asthma symptoms. They are used as needed and provide short-term relief. Your health care provider will help you create an asthma action plan. An asthma action plan is a written plan for managing and treating your asthma attacks. It includes a list of your asthma triggers and how they may be avoided. It also includes information on when medicines should be taken and when their dosage should be changed. An action plan may also involve the use of a device called a peak flow meter. A peak flow meter measures how well the lungs are working. It helps you monitor your condition. HOME CARE INSTRUCTIONS   Take medicines only as directed by your health care provider. Speak with your health care provider if you have questions about how or when to take the medicines.  Use a peak flow meter as directed by your health care provider. Record and keep track of readings.  Understand and use the action plan to help minimize or stop an asthma attack without needing to seek medical care.  Control your home environment in the following ways to help prevent asthma attacks:  Do not smoke. Avoid being exposed to secondhand smoke.  Change your heating and air conditioning filter regularly.  Limit your use of fireplaces and wood stoves.  Get rid of pests (such as roaches   and mice) and their droppings.  Throw away plants if you see mold on them.  Clean your floors and dust regularly. Use unscented cleaning products.  Try to have someone else vacuum for you regularly. Stay out of rooms while they are  being vacuumed and for a short while afterward. If you vacuum, use a dust mask from a hardware store, a double-layered or microfilter vacuum cleaner bag, or a vacuum cleaner with a HEPA filter.  Replace carpet with wood, tile, or vinyl flooring. Carpet can trap dander and dust.  Use allergy-proof pillows, mattress covers, and box spring covers.  Wash bed sheets and blankets every week in hot water and dry them in a dryer.  Use blankets that are made of polyester or cotton.  Clean bathrooms and kitchens with bleach. If possible, have someone repaint the walls in these rooms with mold-resistant paint. Keep out of the rooms that are being cleaned and painted.  Wash hands frequently. SEEK MEDICAL CARE IF:   You have wheezing, shortness of breath, or a cough even if taking medicine to prevent attacks.  The colored mucus you cough up (sputum) is thicker than usual.  Your sputum changes from clear or white to yellow, green, gray, or bloody.  You have any problems that may be related to the medicines you are taking (such as a rash, itching, swelling, or trouble breathing).  You are using a reliever medicine more than 2-3 times per week.  Your peak flow is still at 50-79% of your personal best after following your action plan for 1 hour.  You have a fever. SEEK IMMEDIATE MEDICAL CARE IF:   You seem to be getting worse and are unresponsive to treatment during an asthma attack.  You are short of breath even at rest.  You get short of breath when doing very little physical activity.  You have difficulty eating, drinking, or talking due to asthma symptoms.  You develop chest pain.  You develop a fast heartbeat.  You have a bluish color to your lips or fingernails.  You are light-headed, dizzy, or faint.  Your peak flow is less than 50% of your personal best.   This information is not intended to replace advice given to you by your health care provider. Make sure you discuss any  questions you have with your health care provider.   Document Released: 12/30/2004 Document Revised: 09/20/2014 Document Reviewed: 07/29/2012 Elsevier Interactive Patient Education 2016 [employer]  

## 2015-01-24 ENCOUNTER — Encounter (INDEPENDENT_AMBULATORY_CARE_PROVIDER_SITE_OTHER): Payer: Self-pay

## 2015-01-24 ENCOUNTER — Ambulatory Visit (INDEPENDENT_AMBULATORY_CARE_PROVIDER_SITE_OTHER): Payer: BLUE CROSS/BLUE SHIELD | Admitting: Internal Medicine

## 2015-01-24 ENCOUNTER — Encounter: Payer: Self-pay | Admitting: Internal Medicine

## 2015-01-24 VITALS — BP 116/78 | HR 101 | Ht 68.0 in | Wt 204.0 lb

## 2015-01-24 DIAGNOSIS — J45901 Unspecified asthma with (acute) exacerbation: Secondary | ICD-10-CM | POA: Diagnosis not present

## 2015-01-24 MED ORDER — ALBUTEROL SULFATE HFA 108 (90 BASE) MCG/ACT IN AERS: 1.0000 | INHALATION_SPRAY | RESPIRATORY_TRACT | Status: DC | PRN

## 2015-01-24 MED ORDER — PREDNISONE 10 MG PO TABS: ORAL_TABLET | ORAL | Status: DC

## 2015-01-24 MED ORDER — FLUTICASONE PROPIONATE 50 MCG/ACT NA SUSP: 2.0000 | Freq: Every day | NASAL | Status: DC

## 2015-01-24 MED ORDER — FLUTICASONE FUROATE-VILANTEROL 200-25 MCG/INH IN AEPB: 1.0000 | INHALATION_SPRAY | Freq: Every day | RESPIRATORY_TRACT | Status: DC

## 2015-01-24 NOTE — Patient Instructions (Addendum)
ICD-9-CM ICD-10-CM   1. Asthma with acute exacerbation, unspecified asthma severity 493.92 J45.901     Please take Take prednisone 40mg  once daily x 3 days, then 30mg  once daily x 3 days, then 20mg  once daily x 3 days, then prednisone 10mg  once daily  x 3 days and stop  STart BREO high dose 1 puff daily - take samople and refill  Continue albuterol as needed  START/ take generic fluticasone inhaler 2 squirts each nostril daily   Followup  - full PFT in 12-14 days  - ROV with NP in 12-14 days   - repeta FeNO and ACQ at followup   - Depending on response address acid reflux and sinus issues and allergy history at follow-up including consideration for sinus CT, blood IgE and blood allergy panel

## 2015-01-24 NOTE — Progress Notes (Signed)
Subjective:    Patient ID: Linda Dalton, female    DOB: 1975/08/05, 40 y.o.   MRN: [id number] PCP No PCP Per Patient  HPI   IOV 01/24/2015  Chief Complaint  Patient presents with  . Pulmonary Consult    Pt is self referred for asthma. Pt went to [facility] in December 2016 for asthma. Pt c/o dry cough, DOE, and chest tightness with activity.    40 year old African-American female who works at [employer] referred for evaluation of asthma. Reports that she has had asthma since birth. At birth she was in the hospital for the first 6 months. Details unknown. She was born in [place]. Room was childhood having a lot of respiratory symptoms consistent with asthma. Apparently pediatricians recommended tonsillectomy and sinus surgeries but her mom was fearful of surgeries always declined. Then as a young adult outgrew asthma. Subsequently she got pregnant 22 years ago at age 38 and then her asthma record of her pregnancy. Her last pregnancy was 7 years ago. She has had a total of 5 successful pregnancies. After the pregnancy as well as largely quiet sent with rare albuterol use. However in the last 6 months she is more symptomatic. Due to a change in her job status and change in insurance she is not able to afford ER prescribed Advair so she is only been on albuterol as needed. Symptoms made worse by hot humid air: Work environment: Stress, smoke especially cigarette smoke [she quit smoking]. Symptoms resolved by or improved by albuterol inhaler being in a cool air and on exertion.  Asthma control 5. question shows symptom score of 4.6 which is significant severe symptoms. She's been like this for 3 months. Specifically she wakes up in the night several times. She has quite severe symptoms in the morning as soon as she gets up. She is very limited in her activities because of asthma. She experiences a great deal of shortness of breath because of asthma. She also wheezes a lot of the time and uses  albuterol 5-a puffs on most days for rescue.  She does have chronic nasal congestion    Asthma Control Panel 01/24/2015   Current Med Regimen Alb prn  ACQ 5 point- 1 week. wtd avg score. <1.0 is good control 0.75-1.25 is grey zone. >1.25 poor control. Delta 0.5 is clinically meaningful  4.6.   ACQ 7 point - 1 week. wtd avg score. <1.0 is good control 0.75-1.25 is grey zone. >1.25 poor control. Delta 0.5 is clinically meaningful x  ACT - a [employer] test - 4 week. Total score. Max is 25, Lower score is worse.  <19 = poor control x  FeNO ppB 166 ppb and SUPER HIGH  FeV1    Planned intervention  for visit     Laboratory review shows 12/12/2014 elevated eosinophils of 1100  cxr 12/25/14 - hyperinflated, clear  BMET  - creat 0.65mg %      has a past medical history of Asthma; Fibroid tumor; PONV (postoperative nausea and vomiting); DVT (deep venous thrombosis) ([place]); and Presence of IVC filter. - DVT extensive left leg status post, lysis in the setting of hormone replacement therapy and hysterectomy. Was treated with Xarelto and IVC filter. This was in April 2015. Then in gender 2016 underwent open reduction internal fixation of the right ankle fracture   reports that she quit smoking about 6 months ago. Her smoking use included Cigarettes. She has a 8 pack-year smoking history. She has never used smokeless tobacco.  Past Surgical History  Procedure Laterality Date  . Breast surgery    . Abdominal hysterectomy    . Tubal ligation    . Orif ankle fracture Right 02/02/2014    Procedure: OPEN REDUCTION INTERNAL FIXATION (ORIF) RIGHT TRIMALLEOLAR ANKLE FRACTURE;  Surgeon: Marianna Payment, MD;  Location: [facility];  Service: Orthopedics;  Laterality: Right;    Allergies  Allergen Reactions  . Biaxin [Clarithromycin] Swelling    Mouth swelling  . Codeine Hives     There is no immunization history on file for this patient.  Family History  Problem Relation Age of Onset  . Congestive  Heart Failure Mother   . Diabetes Mother   . Asthma Mother   . Hypertension Mother   . Asthma Father   . Sleep apnea Father   . Hypercholesterolemia Father   . Heart murmur Sister   . Heart disease Brother   . Cancer Other   . Congestive Heart Failure Other      Current outpatient prescriptions:  .  albuterol (PROVENTIL HFA;VENTOLIN HFA) 108 (90 BASE) MCG/ACT inhaler, Inhale 1-2 puffs into the lungs every 4 (four) hours as needed for wheezing or shortness of breath., Disp: 1 Inhaler, Rfl: 0 .  Spacer/Aero-Holding Chambers (AEROCHAMBER PLUS WITH MASK) inhaler, Use as instructed (Patient not taking: Reported on 01/24/2015), Disp: 1 each, Rfl: 2     Review of Systems  Constitutional: Negative for fever and unexpected weight change.  HENT: Negative for congestion, dental problem, ear pain, nosebleeds, postnasal drip, rhinorrhea, sinus pressure, sneezing, sore throat and trouble swallowing.   Eyes: Negative for redness and itching.  Respiratory: Positive for cough and shortness of breath. Negative for chest tightness and wheezing.   Cardiovascular: Negative for palpitations and leg swelling.  Gastrointestinal: Negative for nausea and vomiting.  Genitourinary: Negative for dysuria.  Musculoskeletal: Negative for joint swelling.  Skin: Negative for rash.  Neurological: Negative for headaches.  Hematological: Does not bruise/bleed easily.  Psychiatric/Behavioral: Negative for dysphoric mood. The patient is not nervous/anxious.        Objective:   Physical Exam  Constitutional: She is oriented to person, place, and time. She appears well-developed and well-nourished. No distress.  HENT:  Head: Normocephalic and atraumatic.  Right Ear: External ear normal.  Left Ear: External ear normal.  Mouth/Throat: Oropharynx is clear and moist. No oropharyngeal exudate.  Coughs a lot Nasal twang Turbinates inflamed  Eyes: Conjunctivae and EOM are normal. Pupils are equal, round, and reactive  to light. Right eye exhibits no discharge. Left eye exhibits no discharge. No scleral icterus.  Neck: Normal range of motion. Neck supple. No JVD present. No tracheal deviation present. No thyromegaly present.  Cardiovascular: Normal rate, regular rhythm, normal heart sounds and intact distal pulses.  Exam reveals no gallop and no friction rub.   No murmur heard. Pulmonary/Chest: Effort normal and breath sounds normal. No respiratory distress. She has no wheezes. She has no rales. She exhibits no tenderness.  Abdominal: Soft. Bowel sounds are normal. She exhibits no distension and no mass. There is no tenderness. There is no rebound and no guarding.  Musculoskeletal: Normal range of motion. She exhibits no edema or tenderness.  Lymphadenopathy:    She has no cervical adenopathy.  Neurological: She is alert and oriented to person, place, and time. She has normal reflexes. No cranial nerve deficit. She exhibits normal muscle tone. Coordination normal.  Skin: Skin is warm and dry. No rash noted. She is not diaphoretic. No erythema. No  pallor.  Psychiatric: She has a normal mood and affect. Her behavior is normal. Judgment and thought content normal.  Vitals reviewed.   Filed Vitals:   01/24/15 1152  BP: 116/78  Pulse: 101  Height: 5\' 8"  (1.727 m)  Weight: 204 lb (92.534 kg)  SpO2: 96%         Assessment & Plan:     ICD-9-CM ICD-10-CM   1. Asthma with acute exacerbation, unspecified asthma severity 493.92 J45.901    Please take Take prednisone 40mg  once daily x 3 days, then 30mg  once daily x 3 days, then 20mg  once daily x 3 days, then prednisone 10mg  once daily  x 3 days and stop  STart BREO high dose 1 puff daily - take samople and refill  Continue albuterol as needed  START/ take generic fluticasone inhaler 2 squirts each nostril daily   Followup  - full PFT in 12-14 days  - ROV with NP in 12-14 days   - repeta FeNO and ACQ at followup   - Depending on response address  acid reflux and sinus issues and allergy history at follow-up including consideration for sinus CT, blood IgE and blood allergy panel   Dr. Brand Males, M.D., [facility].C.P Pulmonary and Critical Care Medicine Staff Physician [facility] Pager: [phone], If no answer or between  15:00h - 7:00h: call [phone]  01/24/2015 12:19 PM

## 2015-01-25 ENCOUNTER — Telehealth: Payer: Self-pay | Admitting: *Deleted

## 2015-01-25 LAB — NITRIC OXIDE: Nitric Oxide: 166

## 2015-01-25 NOTE — Addendum Note (Signed)
Addended by: Maurice March on: 01/25/2015 09:54 AM   Modules accepted: Orders

## 2015-01-25 NOTE — Telephone Encounter (Signed)
Initiated PA for Fluticasone spray thru [facility].  Key: [place] Medication was approved until 01/25/2016. Pharmacy informed.

## 2015-02-20 ENCOUNTER — Ambulatory Visit: Payer: BLUE CROSS/BLUE SHIELD | Admitting: Adult Health

## 2015-05-07 ENCOUNTER — Other Ambulatory Visit: Payer: Self-pay | Admitting: Emergency Medicine

## 2015-05-07 MED ORDER — FLUTICASONE PROPIONATE 50 MCG/ACT NA SUSP: 2.0000 | Freq: Every day | NASAL | Status: DC

## 2015-05-07 MED ORDER — FLUTICASONE FUROATE-VILANTEROL 200-25 MCG/INH IN AEPB: 1.0000 | INHALATION_SPRAY | Freq: Every day | RESPIRATORY_TRACT | Status: DC

## 2015-05-07 MED ORDER — ALBUTEROL SULFATE HFA 108 (90 BASE) MCG/ACT IN AERS: 1.0000 | INHALATION_SPRAY | RESPIRATORY_TRACT | Status: DC | PRN

## 2015-07-22 ENCOUNTER — Other Ambulatory Visit: Payer: Self-pay | Admitting: Internal Medicine

## 2015-08-24 ENCOUNTER — Emergency Department (HOSPITAL_COMMUNITY)
Admission: EM | Admit: 2015-08-24 | Discharge: 2015-08-24 | Disposition: A | Discharge status: Home / Self Care | Payer: BLUE CROSS/BLUE SHIELD | Attending: Dermatology | Admitting: Dermatology

## 2015-08-24 ENCOUNTER — Emergency Department (HOSPITAL_COMMUNITY): Payer: BLUE CROSS/BLUE SHIELD

## 2015-08-24 ENCOUNTER — Encounter (HOSPITAL_COMMUNITY): Payer: Self-pay

## 2015-08-24 DIAGNOSIS — J45909 Unspecified asthma, uncomplicated: Secondary | ICD-10-CM | POA: Diagnosis not present

## 2015-08-24 DIAGNOSIS — Z5321 Procedure and treatment not carried out due to patient leaving prior to being seen by health care provider: Secondary | ICD-10-CM | POA: Insufficient documentation

## 2015-08-24 DIAGNOSIS — Z87891 Personal history of nicotine dependence: Secondary | ICD-10-CM | POA: Diagnosis not present

## 2015-08-24 DIAGNOSIS — R05 Cough: Secondary | ICD-10-CM | POA: Insufficient documentation

## 2015-08-24 DIAGNOSIS — R509 Fever, unspecified: Secondary | ICD-10-CM | POA: Insufficient documentation

## 2015-08-24 LAB — BASIC METABOLIC PANEL
ANION GAP: 9 (ref 5–15)
BUN: 7 mg/dL (ref 6–20)
CO2: 21 mmol/L — AB (ref 22–32)
Calcium: 8.8 mg/dL — ABNORMAL LOW (ref 8.9–10.3)
Chloride: 104 mmol/L (ref 101–111)
Creatinine, Ser: 0.9 mg/dL (ref 0.44–1.00)
GFR calc Af Amer: >60 mL/min (ref >60)
GFR calc non Af Amer: >60 mL/min (ref >60)
GLUCOSE: 97 mg/dL (ref 65–99)
POTASSIUM: 3.2 mmol/L — AB (ref 3.5–5.1)
Sodium: 134 mmol/L — ABNORMAL LOW (ref 135–145)

## 2015-08-24 LAB — CBC WITH DIFFERENTIAL/PLATELET
BASOS ABS: 0 10*3/uL (ref 0.0–0.1)
Basophils Relative: 0 %
EOS PCT: 6 %
Eosinophils Absolute: 0.5 10*3/uL (ref 0.0–0.7)
HEMATOCRIT: 40.8 % (ref 36.0–46.0)
Hemoglobin: 13.8 g/dL (ref 12.0–15.0)
LYMPHS ABS: 1.7 10*3/uL (ref 0.7–4.0)
LYMPHS PCT: 21 %
MCH: 30.2 pg (ref 26.0–34.0)
MCHC: 33.8 g/dL (ref 30.0–36.0)
MCV: 89.3 fL (ref 78.0–100.0)
MONO ABS: 0.6 10*3/uL (ref 0.1–1.0)
Monocytes Relative: 7 %
NEUTROS ABS: 5.4 10*3/uL (ref 1.7–7.7)
Neutrophils Relative %: 66 %
Platelets: 256 10*3/uL (ref 150–400)
RBC: 4.57 MIL/uL (ref 3.87–5.11)
RDW: 13.2 % (ref 11.5–15.5)
WBC: 8.1 10*3/uL (ref 4.0–10.5)

## 2015-08-24 MED ORDER — ACETAMINOPHEN 325 MG PO TABS
ORAL_TABLET | ORAL | Status: AC
  Filled 2015-08-24: qty 2

## 2015-08-24 MED ORDER — ACETAMINOPHEN 325 MG PO TABS
650.0000 mg | ORAL_TABLET | Freq: Once | ORAL | Status: AC | PRN
  Administered 2015-08-24: 650 mg via ORAL

## 2015-08-24 MED ORDER — ALBUTEROL SULFATE (2.5 MG/3ML) 0.083% IN NEBU
5.0000 mg | INHALATION_SOLUTION | Freq: Once | RESPIRATORY_TRACT | Status: AC
  Administered 2015-08-24: 5 mg via RESPIRATORY_TRACT

## 2015-08-24 MED ORDER — ALBUTEROL SULFATE (2.5 MG/3ML) 0.083% IN NEBU
INHALATION_SOLUTION | RESPIRATORY_TRACT | Status: AC
  Filled 2015-08-24: qty 6

## 2015-08-24 NOTE — ED Triage Notes (Signed)
Pt complaining of asthma and fever. Pt states fever up to 102F. Pt states inhaler does not help asthma. Some wheezing noted in lower lung fields. Pt states cough x 2 days.

## 2015-08-24 NOTE — ED Notes (Signed)
Pt called x 2 in waiting room with no answer

## 2015-10-08 ENCOUNTER — Encounter (HOSPITAL_COMMUNITY): Payer: Self-pay

## 2015-10-08 ENCOUNTER — Emergency Department (HOSPITAL_COMMUNITY): Payer: BLUE CROSS/BLUE SHIELD

## 2015-10-08 DIAGNOSIS — R509 Fever, unspecified: Secondary | ICD-10-CM | POA: Diagnosis present

## 2015-10-08 DIAGNOSIS — J45909 Unspecified asthma, uncomplicated: Secondary | ICD-10-CM | POA: Insufficient documentation

## 2015-10-08 DIAGNOSIS — Z5321 Procedure and treatment not carried out due to patient leaving prior to being seen by health care provider: Secondary | ICD-10-CM | POA: Insufficient documentation

## 2015-10-08 DIAGNOSIS — Z87891 Personal history of nicotine dependence: Secondary | ICD-10-CM | POA: Insufficient documentation

## 2015-10-08 DIAGNOSIS — R079 Chest pain, unspecified: Secondary | ICD-10-CM | POA: Diagnosis not present

## 2015-10-08 LAB — I-STAT TROPONIN, ED: TROPONIN I, POC: 0 ng/mL (ref 0.00–0.08)

## 2015-10-08 LAB — BASIC METABOLIC PANEL
Anion gap: 9 (ref 5–15)
BUN: 6 mg/dL (ref 6–20)
CALCIUM: 8.7 mg/dL — AB (ref 8.9–10.3)
CO2: 21 mmol/L — ABNORMAL LOW (ref 22–32)
Chloride: 102 mmol/L (ref 101–111)
Creatinine, Ser: 0.83 mg/dL (ref 0.44–1.00)
GFR calc Af Amer: >60 mL/min (ref >60)
GLUCOSE: 115 mg/dL — AB (ref 65–99)
Potassium: 3.8 mmol/L (ref 3.5–5.1)
Sodium: 132 mmol/L — ABNORMAL LOW (ref 135–145)

## 2015-10-08 LAB — CBC
HCT: 42.2 % (ref 36.0–46.0)
HEMOGLOBIN: 14 g/dL (ref 12.0–15.0)
MCH: 30.1 pg (ref 26.0–34.0)
MCHC: 33.2 g/dL (ref 30.0–36.0)
MCV: 90.8 fL (ref 78.0–100.0)
Platelets: 270 10*3/uL (ref 150–400)
RBC: 4.65 MIL/uL (ref 3.87–5.11)
RDW: 14.7 % (ref 11.5–15.5)
WBC: 11.1 10*3/uL — AB (ref 4.0–10.5)

## 2015-10-08 MED ORDER — ALBUTEROL SULFATE (2.5 MG/3ML) 0.083% IN NEBU
INHALATION_SOLUTION | RESPIRATORY_TRACT | Status: AC
  Filled 2015-10-08: qty 6

## 2015-10-08 MED ORDER — ALBUTEROL SULFATE (2.5 MG/3ML) 0.083% IN NEBU
5.0000 mg | INHALATION_SOLUTION | Freq: Once | RESPIRATORY_TRACT | Status: AC
  Administered 2015-10-08: 5 mg via RESPIRATORY_TRACT

## 2015-10-08 NOTE — ED Triage Notes (Signed)
Pt states that yesterday started having congestion and fever with generalized body aches, pt took inhaler for SOB with out relief. States when she woke up this morning her chest started hurting and feeling tight, unrelieved by asthma medications. Minimal wheezing in all fields, coughing up green phlegm.

## 2015-10-09 ENCOUNTER — Emergency Department (HOSPITAL_COMMUNITY)
Admission: EM | Admit: 2015-10-09 | Discharge: 2015-10-09 | Disposition: A | Discharge status: Home / Self Care | Payer: BLUE CROSS/BLUE SHIELD | Attending: Emergency Medicine | Admitting: Emergency Medicine

## 2015-10-09 NOTE — ED Notes (Signed)
Pt does not answer when called for vitals or to come back to a room. Unable to find pt in waiting room

## 2015-11-07 ENCOUNTER — Emergency Department (HOSPITAL_COMMUNITY): Payer: BLUE CROSS/BLUE SHIELD

## 2015-11-07 ENCOUNTER — Encounter (HOSPITAL_COMMUNITY): Payer: Self-pay

## 2015-11-07 ENCOUNTER — Emergency Department (HOSPITAL_COMMUNITY)
Admission: EM | Admit: 2015-11-07 | Discharge: 2015-11-07 | Disposition: A | Discharge status: Home / Self Care | Payer: BLUE CROSS/BLUE SHIELD | Attending: Emergency Medicine | Admitting: Emergency Medicine

## 2015-11-07 DIAGNOSIS — Z87891 Personal history of nicotine dependence: Secondary | ICD-10-CM | POA: Diagnosis not present

## 2015-11-07 DIAGNOSIS — Z79899 Other long term (current) drug therapy: Secondary | ICD-10-CM | POA: Diagnosis not present

## 2015-11-07 DIAGNOSIS — J4521 Mild intermittent asthma with (acute) exacerbation: Secondary | ICD-10-CM | POA: Insufficient documentation

## 2015-11-07 DIAGNOSIS — R0981 Nasal congestion: Secondary | ICD-10-CM | POA: Diagnosis present

## 2015-11-07 MED ORDER — ALBUTEROL SULFATE (2.5 MG/3ML) 0.083% IN NEBU
5.0000 mg | INHALATION_SOLUTION | Freq: Once | RESPIRATORY_TRACT | Status: AC
  Administered 2015-11-07: 5 mg via RESPIRATORY_TRACT
  Filled 2015-11-07: qty 6

## 2015-11-07 MED ORDER — ALBUTEROL SULFATE HFA 108 (90 BASE) MCG/ACT IN AERS
2.0000 | INHALATION_SPRAY | RESPIRATORY_TRACT | Status: DC | PRN
  Administered 2015-11-07: 2 via RESPIRATORY_TRACT
  Filled 2015-11-07: qty 6.7

## 2015-11-07 MED ORDER — PREDNISONE 20 MG PO TABS: ORAL_TABLET | ORAL | 0 refills | Status: DC

## 2015-11-07 MED ORDER — ALBUTEROL SULFATE (2.5 MG/3ML) 0.083% IN NEBU: 2.5000 mg | INHALATION_SOLUTION | RESPIRATORY_TRACT | 0 refills | Status: DC | PRN

## 2015-11-07 NOTE — ED Provider Notes (Signed)
[facility] DEPT Provider Note   CSN: [id number] Arrival date & time: 11/07/15  [id number]     History   Chief Complaint Chief Complaint  Patient presents with  . Nasal Congestion  . Wheezing    HPI Linda Dalton is a 40 y.o. female.  HPI   40 year old female here with wheezing and nasal congestion.  sxs started this AM when leaving from work.  Sxs has been intermittent x 1 month.  Hx of asthma and allergy, recently changed in living location from [place] to [facility] for the past 2 years, and have notice increase in asthma attack.  States dust and cold air worsen her sxs.  Denies fever, chills, n/v, abd pain.  Pt on PRN prednisone, Breo, proAir, Allegra.  Pt received 2 Duonebs today which has helped.  NO hx of intubation or ICU stay. She presents today because she ran out of her nebs last night.  Does have a PCP but haven't establish care yet.  Does have an upcoming appointment next month. No hx of PE/DVT, no recent surgery, prolonged bed rest, oral hormone use or active cancer.     Past Medical History:  Diagnosis Date  . Asthma   . DVT (deep venous thrombosis) ([place])   . Fibroid tumor   . PONV (postoperative nausea and vomiting)   . Presence of IVC filter     Patient Active Problem List   Diagnosis Date Noted  . Trimalleolar fracture of right ankle 02/02/2014  . DVT (deep venous thrombosis) ([place]) 05/01/2013  . Acute DVT (deep venous thrombosis) ([place]) 05/01/2013  . ASTHMA 02/04/2007  . G E REFLUX 02/04/2007    Past Surgical History:  Procedure Laterality Date  . ABDOMINAL HYSTERECTOMY    . BREAST SURGERY    . ORIF ANKLE FRACTURE Right 02/02/2014   Procedure: OPEN REDUCTION INTERNAL FIXATION (ORIF) RIGHT TRIMALLEOLAR ANKLE FRACTURE;  Surgeon: Marianna Payment, MD;  Location: [facility];  Service: Orthopedics;  Laterality: Right;  . TUBAL LIGATION      OB History    No data available       Home Medications    Prior to Admission medications   Medication Sig Start  Date End Date Taking? Authorizing Provider  BREO ELLIPTA 200-25 MCG/INH AEPB USE 1 INHALATION DAILY (WILL NEED APPOINTMENT FOR FURTHER REFILLS) 07/23/15   Brand Males, MD  fluticasone (FLONASE) 50 MCG/ACT nasal spray USE 2 SPRAYS IN EACH NOSTRIL DAILY (NEED APPOINTMENT FOR FURTHER REFILLS ) 07/23/15   Brand Males, MD  predniSONE (DELTASONE) 10 MG tablet Take 40mg  for 3 days, then 30mg  for 3 days, 20mg  for 3 days, 10mg  for 3 days, then stop 01/24/15   Brand Males, MD  PROAIR HFA 108 (90 Base) MCG/ACT inhaler USE 1 TO 2 INHALATIONS EVERY 4 HOURS AS NEEDED FOR WHEEZING OR SHORTNESS OF BREATH (WILL NEED APPOINTMENT FOR FURTHER REFILLS) 07/23/15   Brand Males, MD  Spacer/Aero-Holding Chambers (AEROCHAMBER PLUS WITH MASK) inhaler Use as instructed Patient not taking: Reported on 01/24/2015 12/25/14   Charlesetta Shanks, MD    Family History Family History  Problem Relation Age of Onset  . Congestive Heart Failure Mother   . Diabetes Mother   . Asthma Mother   . Hypertension Mother   . Asthma Father   . Sleep apnea Father   . Hypercholesterolemia Father   . Heart murmur Sister   . Heart disease Brother   . Cancer Other   . Congestive Heart Failure Other  Social History Social History  Substance Use Topics  . Smoking status: Former Smoker    Packs/day: 1.00    Years: 8.00    Types: Cigarettes    Quit date: 07/14/2014  . Smokeless tobacco: Never Used  . Alcohol use 0.0 oz/week     Comment: rare     Allergies   Biaxin [clarithromycin] and Codeine   Review of Systems Review of Systems  All other systems reviewed and are negative.    Physical Exam Updated Vital Signs BP 126/92   Pulse 79   Temp 98.5 F (36.9 C) (Oral)   Resp 21   LMP 03/15/2012   SpO2 99%   Physical Exam  Constitutional: She appears well-developed and well-nourished. No distress.  HENT:  Head: Atraumatic.  Eyes: Conjunctivae are normal.  Neck: Neck supple.  Cardiovascular: Normal rate,  regular rhythm and intact distal pulses.   Pulmonary/Chest:  Mild inspiratory and expiratory wheezes without rales, rhonchi  Abdominal: Soft. There is no tenderness.  Musculoskeletal: She exhibits no edema.  Neurological: She is alert.  Skin: No rash noted.  Psychiatric: She has a normal mood and affect.  Nursing note and vitals reviewed.    ED Treatments / Results  Labs (all labs ordered are listed, but only abnormal results are displayed) Labs Reviewed - No data to display  EKG  EKG Interpretation None       Radiology Dg Chest 2 View  Result Date: 11/07/2015 CLINICAL DATA:  Shortness of breath and wheezing for 2 days. History of asthma. EXAM: CHEST  2 VIEW COMPARISON:  10/08/2015 FINDINGS: The cardiac silhouette, mediastinal and hilar contours are normal and stable. The lungs are clear. No pleural effusion. The bony thorax is intact. IMPRESSION: No acute cardiopulmonary findings. Electronically Signed   By: Marijo Sanes M.D.   On: 11/07/2015 10:58    Procedures Procedures (including critical care time)  Medications Ordered in ED Medications  albuterol (PROVENTIL) (2.5 MG/3ML) 0.083% nebulizer solution 5 mg (5 mg Nebulization Given 11/07/15 1124)     Initial Impression / Assessment and Plan / ED Course  I have reviewed the triage vital signs and the nursing notes.  Pertinent labs & imaging results that were available during my care of the patient were reviewed by me and considered in my medical decision making (see chart for details).  Clinical Course    BP 127/92   Pulse 79   Temp 98.5 F (36.9 C) (Oral)   Resp 21   LMP 03/15/2012   SpO2 99%    Final Clinical Impressions(s) / ED Diagnoses   Final diagnoses:  Mild intermittent asthma with exacerbation    New Prescriptions New Prescriptions   ALBUTEROL (PROVENTIL) (2.5 MG/3ML) 0.083% NEBULIZER SOLUTION    Take 3 mLs (2.5 mg total) by nebulization every 4 (four) hours as needed for wheezing or  shortness of breath.   PREDNISONE (DELTASONE) 20 MG TABLET    2 tabs po daily x 4 days   1:09 PM Pt with sob and wheezing likely asthma exacerbation.  After receiving 2 duonebs treatment pt felt better, ambulate maintaining O2 97% on RA.  Resting comfortably, CXR unremarkable.  Will refill her nebs and provide a short course of steroid.  Pt to f/u with PCP for further care.     Domenic Moras, PA-C 11/07/15 1322    Davonna Belling, MD 11/07/15 [phone]

## 2015-11-07 NOTE — ED Notes (Signed)
Pt ambulated around Pod A with this RN with no difficulty. The pts oxygen level stayed around 97% while walking.

## 2015-11-07 NOTE — ED Triage Notes (Signed)
Patient here with acute onset of wheezing and chest tightness after going outside this am. Used inhaler and neb machine with minimal relief. Speaking complete sentences

## 2015-11-07 NOTE — ED Notes (Signed)
Pt states that she took aphedrine prior to arrival to the ED.

## 2015-12-25 ENCOUNTER — Encounter (HOSPITAL_COMMUNITY): Payer: Self-pay | Admitting: Emergency Medicine

## 2015-12-25 ENCOUNTER — Ambulatory Visit (HOSPITAL_COMMUNITY)
Admission: EM | Admit: 2015-12-25 | Discharge: 2015-12-25 | Disposition: A | Discharge status: Home / Self Care | Payer: BLUE CROSS/BLUE SHIELD | Attending: Emergency Medicine | Admitting: Emergency Medicine

## 2015-12-25 DIAGNOSIS — J0100 Acute maxillary sinusitis, unspecified: Secondary | ICD-10-CM

## 2015-12-25 DIAGNOSIS — J36 Peritonsillar abscess: Secondary | ICD-10-CM | POA: Diagnosis not present

## 2015-12-25 MED ORDER — DEXAMETHASONE SODIUM PHOSPHATE 10 MG/ML IJ SOLN
10.0000 mg | Freq: Once | INTRAMUSCULAR | Status: AC
  Administered 2015-12-25: 10 mg via INTRAMUSCULAR

## 2015-12-25 MED ORDER — CLINDAMYCIN HCL 300 MG PO CAPS: 300.0000 mg | ORAL_CAPSULE | Freq: Four times a day (QID) | ORAL | 0 refills | Status: DC

## 2015-12-25 MED ORDER — DEXAMETHASONE SODIUM PHOSPHATE 10 MG/ML IJ SOLN
INTRAMUSCULAR | Status: AC
  Filled 2015-12-25: qty 1

## 2015-12-25 MED ORDER — PREDNISONE 10 MG (21) PO TBPK
ORAL_TABLET | ORAL | 0 refills | Status: DC
Start: 2015-12-25 — End: 2016-04-29

## 2015-12-25 MED ORDER — IBUPROFEN 800 MG PO TABS: 800.0000 mg | ORAL_TABLET | Freq: Three times a day (TID) | ORAL | 0 refills | Status: DC | PRN

## 2015-12-25 NOTE — ED Triage Notes (Signed)
Sore throat for a week, 2 days ago noticed right eye and ear pain.  Right side of face is painful.  Patient has a history of asthma, concerned for tightness in chest patient has a cough.  Unknown fever.  Reports blowing green from nose-helped symptoms temporarily

## 2015-12-25 NOTE — Discharge Instructions (Signed)
Stop Allegra. Start Mucinex or Mucinex D. Start some saline nasal irrigation such as a neti pot or Milta Deiters med sinus rinse. Restart the Flonase. Finish up the clindamycin and the prednisone. If you're not getting significantly better in 2 days, follow up with Dr. Benjamine Mola, ENT. Go to the ER if he get any worse, difficulty breathing, unable to open up her mouth, drooling, or muffled voice.

## 2015-12-25 NOTE — ED Provider Notes (Addendum)
HPI  SUBJECTIVE:  Linda Dalton is a 40 y.o. female who presents with sore throat for the past 5 days. She reports fevers Tmax 101. She reports right-sided lymphadenopathy. She also reports nasal congestion, rhinorrhea, cough, right sided sinus pain and pressure. She reports right ear pain and states that here she hears "swishing" in her ear. She denies any otorrhea, foreign body insertion. She has tried Mucinex, Tylenol, ibuprofen 800 mg. Symptoms are better with Mucinex and ibuprofen. Symptoms are worse with hot air, swallowing. She denies difficulty breathing, drooling, trismus, muffled hot potato voice. No contacts with strep or mono. She states that her allergies are bothering her, but she is taking Allegra with no improvement. She denies bodyaches, headaches, abdominal pain, GERD symptoms, rash, upper dental pain. She has a past medical history of asthma, DVT status post IVC filter. She is not on any anticoagulants or antiplatelets. 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 No history of diabetes, hypertension. LMP: Hysterectomy. PCP: Dr. Ena Dawley  Past Medical History:  Diagnosis Date  . Asthma   . DVT (deep venous thrombosis) ([place])   . Fibroid tumor   . PONV (postoperative nausea and vomiting)   . Presence of IVC filter     Past Surgical History:  Procedure Laterality Date  . ABDOMINAL HYSTERECTOMY    . BREAST SURGERY    . ORIF ANKLE FRACTURE Right 02/02/2014   Procedure: OPEN REDUCTION INTERNAL FIXATION (ORIF) RIGHT TRIMALLEOLAR ANKLE FRACTURE;  Surgeon: Marianna Payment, MD;  Location: [facility];  Service: Orthopedics;  Laterality: Right;  . TUBAL LIGATION      Family History  Problem Relation Age of Onset  . Congestive Heart Failure Mother   . Diabetes Mother   . Asthma Mother   . Hypertension Mother   . Asthma Father   . Sleep apnea Father   . Hypercholesterolemia Father   . Heart murmur Sister   . Heart disease Brother   . Cancer  Other   . Congestive Heart Failure Other     Social History  Substance Use Topics  . Smoking status: Former Smoker    Packs/day: 1.00    Years: 8.00    Types: Cigarettes    Quit date: 07/14/2014  . Smokeless tobacco: Never Used  . Alcohol use 0.0 oz/week     Comment: rare    No current facility-administered medications for this encounter.   Current Outpatient Prescriptions:  .  albuterol (PROVENTIL) (2.5 MG/3ML) 0.083% nebulizer solution, Take 3 mLs (2.5 mg total) by nebulization every 4 (four) hours as needed for wheezing or shortness of breath., Disp: 30 vial, Rfl: 0 .  BREO ELLIPTA 200-25 MCG/INH AEPB, USE 1 INHALATION DAILY (WILL NEED APPOINTMENT FOR FURTHER REFILLS), Disp: 180 each, Rfl: 1 .  guaiFENesin (MUCINEX) 600 MG 12 hr tablet, Take by mouth 2 (two) times daily., Disp: , Rfl:  .  clindamycin (CLEOCIN) 300 MG capsule, Take 1 capsule (300 mg total) by mouth 4 (four) times daily. X 10 days, Disp: 40 capsule, Rfl: 0 .  fluticasone (FLONASE) 50 MCG/ACT nasal spray, USE 2 SPRAYS IN EACH NOSTRIL DAILY (NEED APPOINTMENT FOR FURTHER REFILLS ) (Patient not taking: Reported on 12/25/2015), Disp: 48 g, Rfl: 1 .  ibuprofen (ADVIL,MOTRIN) 800 MG tablet, Take 1 tablet (800 mg total) by mouth every 8 (eight) hours as needed., Disp: 30 tablet, Rfl: 0 .  predniSONE (STERAPRED UNI-PAK 21 TAB) 10 MG (21) TBPK tablet, Dispense one 6 day pack. Take  as directed with food., Disp: 21 tablet, Rfl: 0 .  PROAIR HFA 108 (90 Base) MCG/ACT inhaler, USE 1 TO 2 INHALATIONS EVERY 4 HOURS AS NEEDED FOR WHEEZING OR SHORTNESS OF BREATH (WILL NEED APPOINTMENT FOR FURTHER REFILLS), Disp: 25.5 g, Rfl: 1 .  Spacer/Aero-Holding Chambers (AEROCHAMBER PLUS WITH MASK) inhaler, Use as instructed (Patient not taking: Reported on 01/24/2015), Disp: 1 each, Rfl: 2  Allergies  Allergen Reactions  . Biaxin [Clarithromycin] Swelling    Mouth swelling  . Codeine Hives     ROS  As noted in HPI.   Physical Exam  BP  118/78 (BP Location: Left Arm)   Pulse 93   Temp 99 F (37.2 C) (Oral)   Resp 24   LMP 03/15/2012   SpO2 99%   Constitutional: Well developed, well nourished, no acute distress Eyes:  EOMI, conjunctiva normal bilaterally HENT: Normocephalic, atraumatic,mucus membranes moist. Left TM normal. Right TM erythematous, but not dull or bulging. Positive nasal congestion, positive maxillary sinus tenderness on the right more than left. Positive R soft palate swelling, tenderness, enlarged right tonsil, positive exudates. Uvula appears slightly shift to the right. No drooling, trismus, muffled hot potato voice. Neck: Positive right-sided cervical lymphadenopathy. Respiratory: Normal inspiratory effort Cardiovascular: Normal rate GI: nondistended skin: No rash, skin intact Musculoskeletal: no deformities Neurologic: Alert & oriented x 3, no focal neuro deficits Psychiatric: Speech and behavior appropriate   ED Course   Medications  dexamethasone (DECADRON) injection 10 mg (10 mg Intramuscular Given 12/25/15 1345)    No orders of the defined types were placed in this encounter.   No results found for this or any previous visit (from the past 24 hour(s)). No results found.  ED Clinical Impression  Peritonsillar abscess  Acute maxillary sinusitis, recurrence not specified   ED Assessment/Plan  Presentation consistent with an early peritonsillar abscess and a sinusitis. Will have patient DC Her Allegra and start Mucinex D, restart her Flonase and saline nasal irrigation.    Discussed case with Dr. Benjamine Mola, ENT on call. He advised Decadron 10 mg here in the office, clindamycin 300 mg 4 times a day for 10 days, prednisone Dosepak 10 mg 6 days for the peritonsillar abscess. He will see her in the office if she is not getting significantly better in several days.  Discussed MDM, plan and followup with patient. Discussed sn/sx that should prompt return to the ED. Patient agrees with plan.    Meds ordered this encounter  Medications  . DISCONTD: ibuprofen (ADVIL,MOTRIN) 800 MG tablet    Sig: Take 800 mg by mouth every 8 (eight) hours as needed.  Marland Kitchen guaiFENesin (MUCINEX) 600 MG 12 hr tablet    Sig: Take by mouth 2 (two) times daily.  Marland Kitchen dexamethasone (DECADRON) injection 10 mg  . ibuprofen (ADVIL,MOTRIN) 800 MG tablet    Sig: Take 1 tablet (800 mg total) by mouth every 8 (eight) hours as needed.    Dispense:  30 tablet    Refill:  0  . clindamycin (CLEOCIN) 300 MG capsule    Sig: Take 1 capsule (300 mg total) by mouth 4 (four) times daily. X 10 days    Dispense:  40 capsule    Refill:  0  . predniSONE (STERAPRED UNI-PAK 21 TAB) 10 MG (21) TBPK tablet    Sig: Dispense one 6 day pack. Take as directed with food.    Dispense:  21 tablet    Refill:  0    *This clinic note was created using  [employer]. Therefore, there may be occasional mistakes despite careful proofreading.  ?   Melynda Ripple, MD 12/25/15 [place], MD 12/25/15 [phone]

## 2016-02-10 ENCOUNTER — Other Ambulatory Visit: Payer: Self-pay | Admitting: Internal Medicine

## 2016-03-05 DIAGNOSIS — J4541 Moderate persistent asthma with (acute) exacerbation: Secondary | ICD-10-CM | POA: Diagnosis present

## 2016-04-15 IMAGING — US US EXTREM LOW VENOUS*L*
1 series · 13 of 24 positions shown · non-contrast
Comparison: None.

CLINICAL DATA: 37-year-old female with history of recurrent left
lower extremity DVT in the setting [DATE] Fab syndrome and
mechanical compression related to a massive uterine fibroid.
Percutaneous thrombolysis followed by stenting of her left iliac
vein was performed on 05/01/2013. She presents for her three-month
follow-up ultrasound evaluation.



[Series 1: us extrem low venous*left* · 13 of 36 slices shown]
[im 1/36]
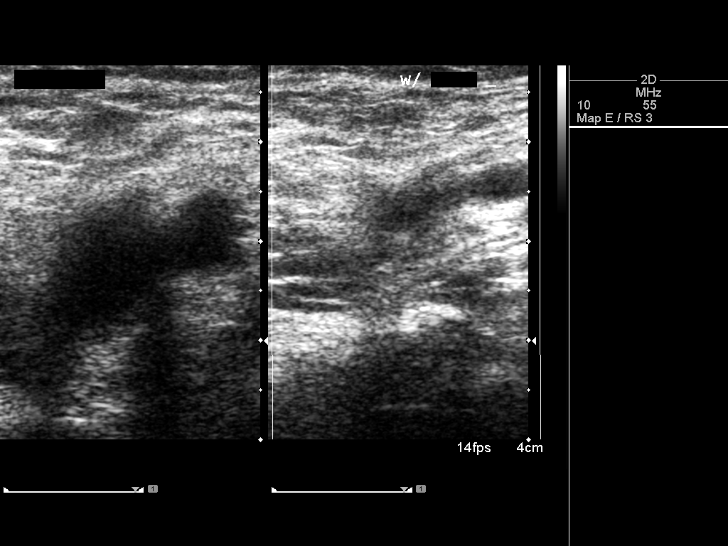
[im 4/36]
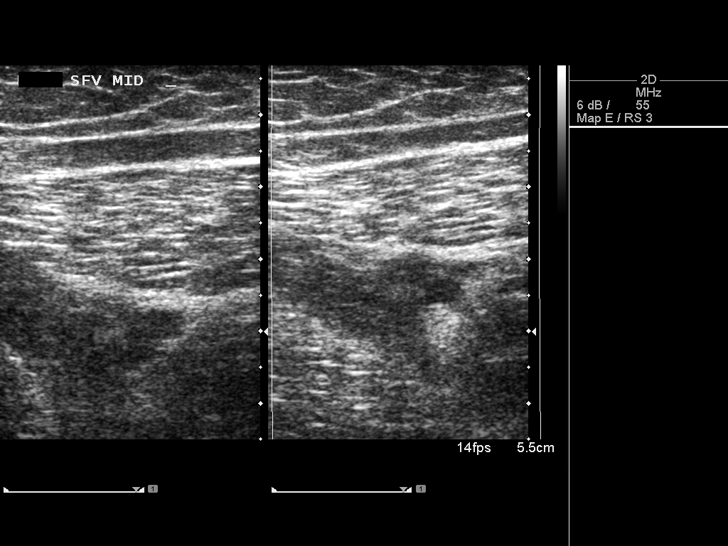
[im 7/36]
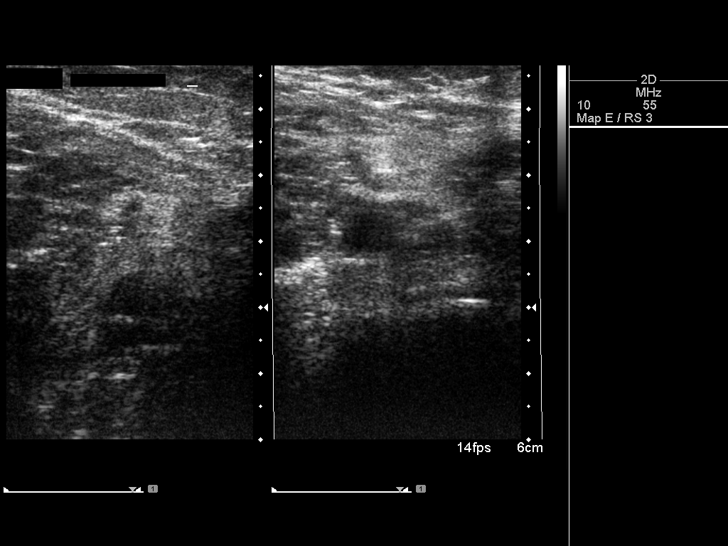
[im 10/36]
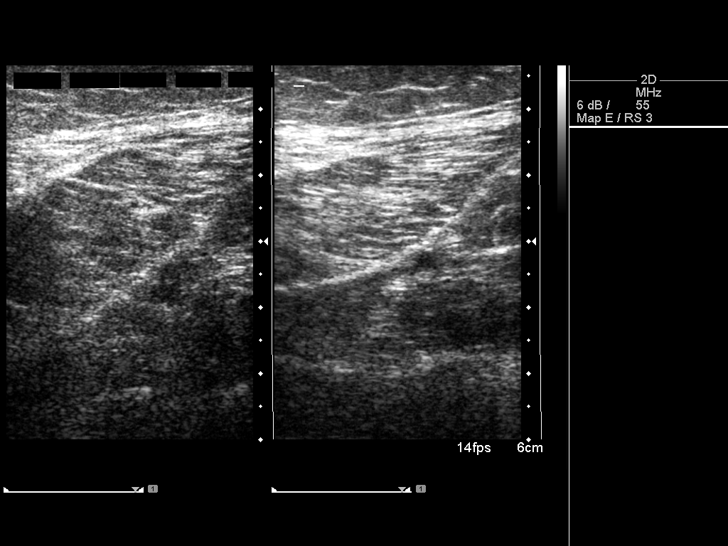
[im 13/36]
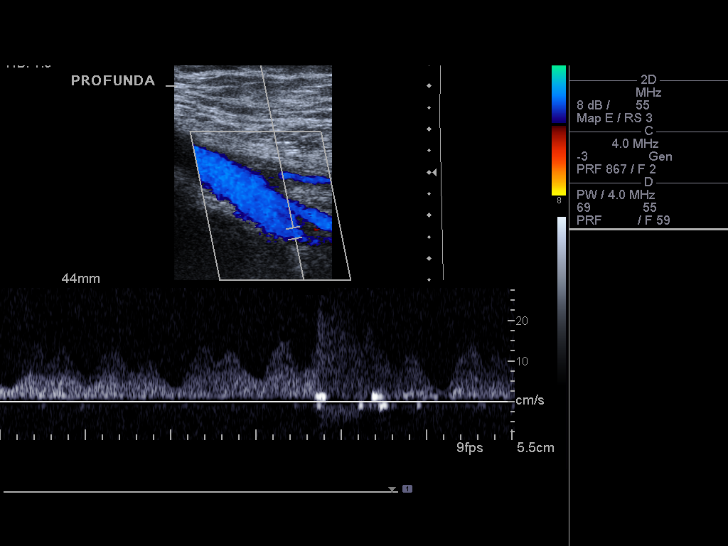
[im 16/36]
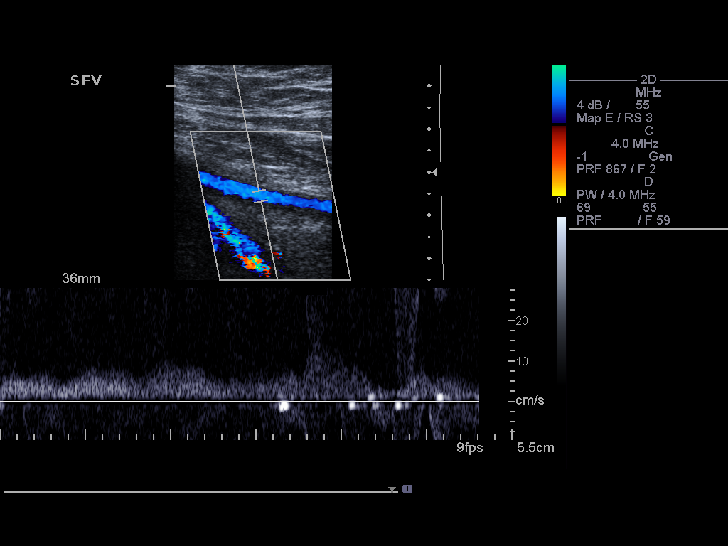
[im 19/36]
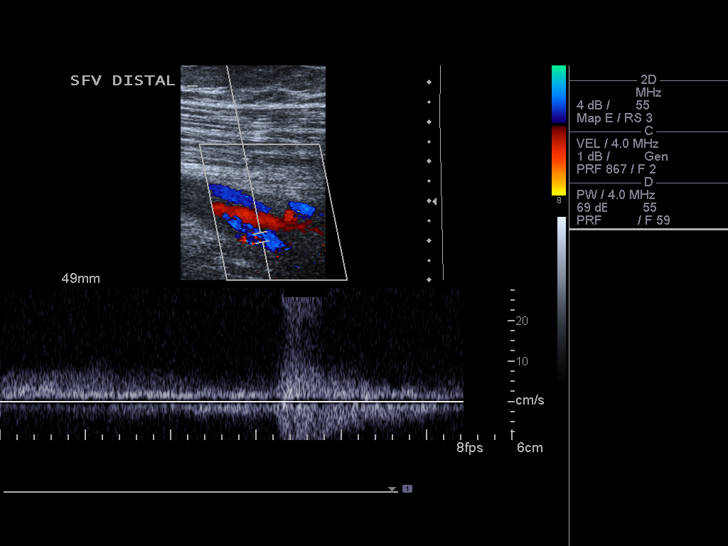
[im 20/36]
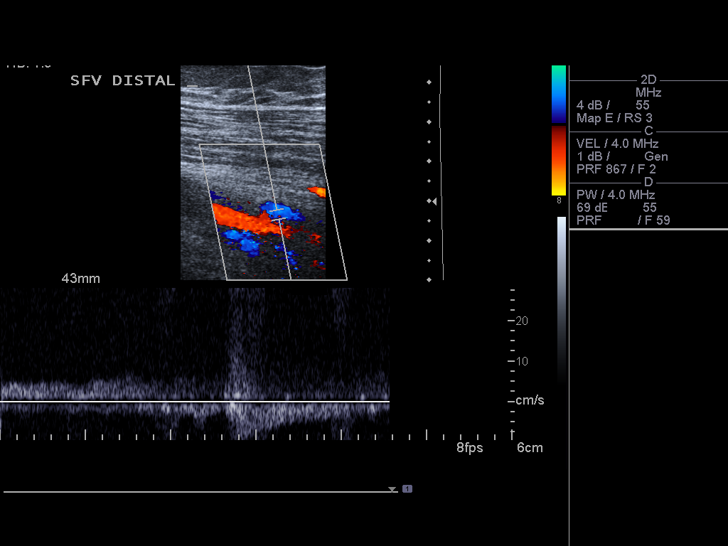
[im 23/36]
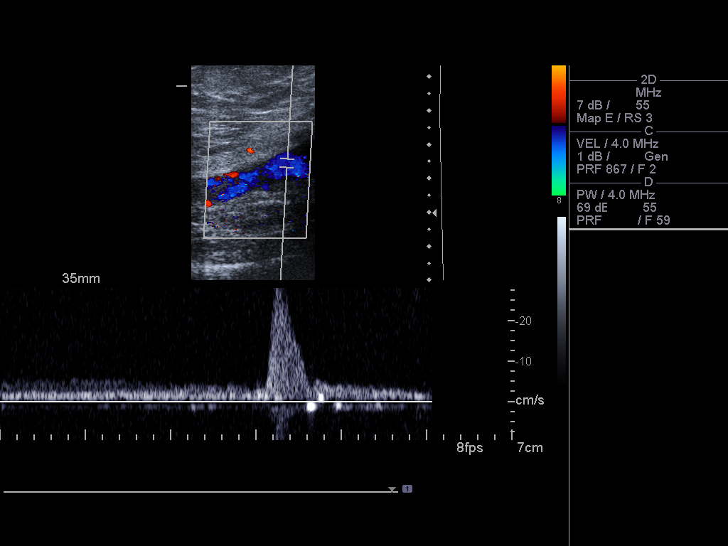
[im 26/36]
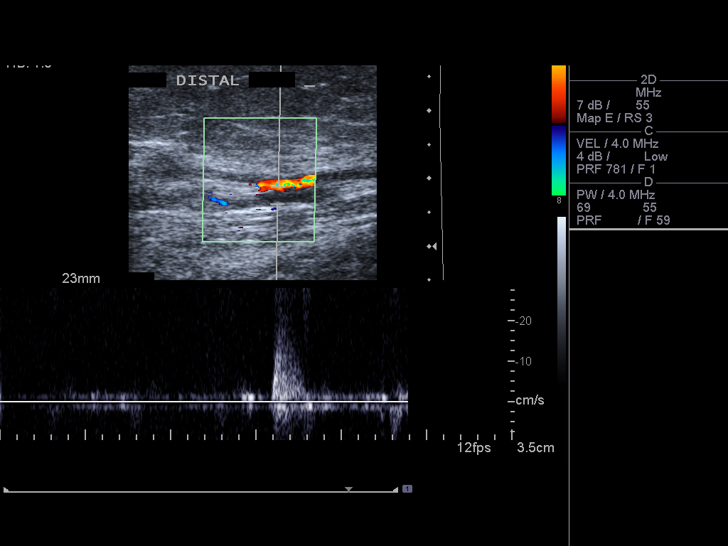
[im 29/36]
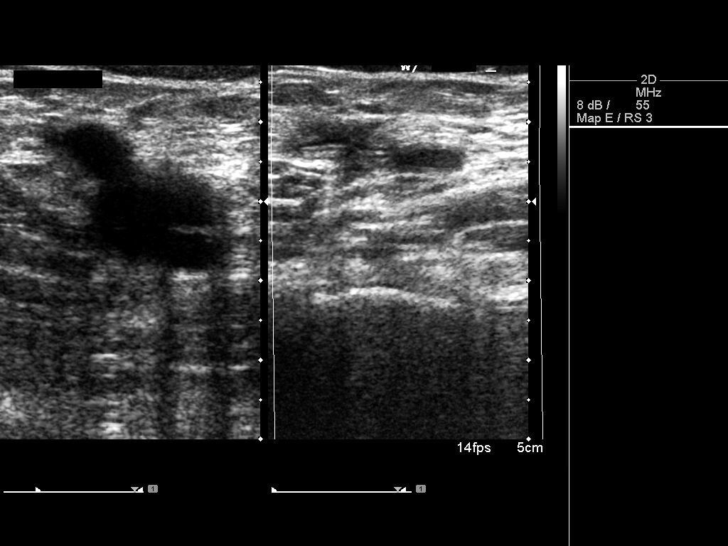
[im 32/36]
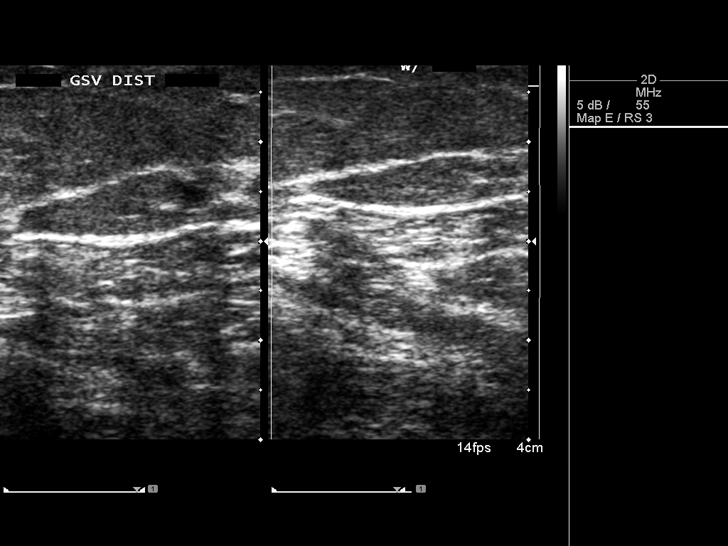
[im 36/36]
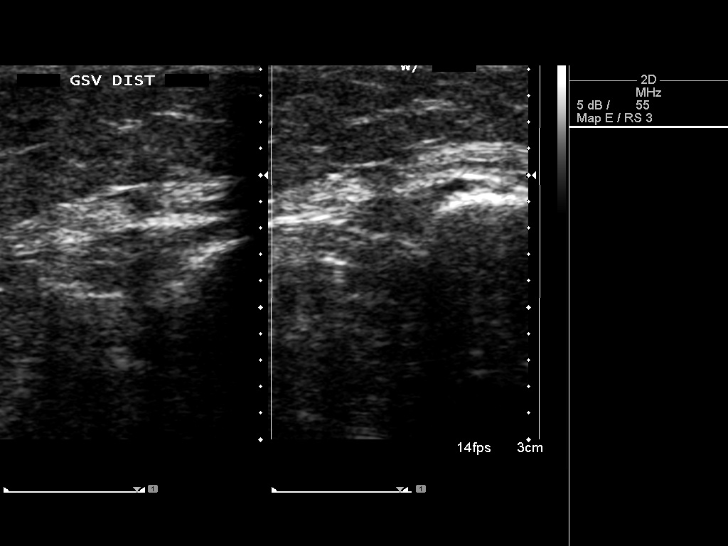

[13 of 24 positions shown; findings below may reference images not displayed]

FINDINGS: Common Femoral Vein: No evidence of thrombus. Normal
compressibility, respiratory phasicity and response to augmentation.

Saphenofemoral Junction: No evidence of thrombus. Normal
compressibility and flow on color Doppler imaging.

Profunda Femoral Vein: No evidence of thrombus. Normal
compressibility and flow on color Doppler imaging.

Femoral Vein: Probable duplicated femoral venous system. There are 2
small femoral veins which appear to re-join just above the popliteal
vein. No evidence of thrombus. Normal compressibility, respiratory
phasicity and response to augmentation.

Popliteal Vein: There may be mild eccentric wall thickening in the
proximal popliteal vein consistent with the sequelae of recanalized
thrombus. No acute thrombus. Normal compressibility, respiratory
phasicity and response to augmentation.

Calf Veins: No evidence of thrombus. Normal compressibility and flow
on color Doppler imaging.

Superficial Great Saphenous Vein: No evidence of thrombus. Normal
compressibility and flow on color Doppler imaging.

Venous Reflux:  None.

Other Findings:  None.
IMPRESSION: 1. No evidence of deep venous thrombosis.
2. Excellent respiratory phasicity in the common femoral vein
suggests patency of the iliac vein stent.
3. Their may be trace residual recanalized chronic DVT in the
popliteal vein.

## 2016-04-27 ENCOUNTER — Emergency Department (HOSPITAL_COMMUNITY): Payer: BLUE CROSS/BLUE SHIELD

## 2016-04-27 ENCOUNTER — Inpatient Hospital Stay (HOSPITAL_COMMUNITY)
Admission: EM | Admit: 2016-04-27 | Discharge: 2016-04-29 | DRG: 203 | Disposition: A | Discharge status: Home / Self Care | Payer: BLUE CROSS/BLUE SHIELD | Attending: Internal Medicine | Admitting: Internal Medicine

## 2016-04-27 ENCOUNTER — Encounter (HOSPITAL_COMMUNITY): Payer: Self-pay | Admitting: Emergency Medicine

## 2016-04-27 DIAGNOSIS — J45901 Unspecified asthma with (acute) exacerbation: Secondary | ICD-10-CM | POA: Diagnosis present

## 2016-04-27 DIAGNOSIS — Z883 Allergy status to other anti-infective agents status: Secondary | ICD-10-CM

## 2016-04-27 DIAGNOSIS — J4541 Moderate persistent asthma with (acute) exacerbation: Secondary | ICD-10-CM | POA: Diagnosis present

## 2016-04-27 DIAGNOSIS — Z8249 Family history of ischemic heart disease and other diseases of the circulatory system: Secondary | ICD-10-CM

## 2016-04-27 DIAGNOSIS — Z833 Family history of diabetes mellitus: Secondary | ICD-10-CM

## 2016-04-27 DIAGNOSIS — Z79899 Other long term (current) drug therapy: Secondary | ICD-10-CM

## 2016-04-27 DIAGNOSIS — Z87891 Personal history of nicotine dependence: Secondary | ICD-10-CM

## 2016-04-27 DIAGNOSIS — Z885 Allergy status to narcotic agent status: Secondary | ICD-10-CM

## 2016-04-27 DIAGNOSIS — Z86718 Personal history of other venous thrombosis and embolism: Secondary | ICD-10-CM

## 2016-04-27 DIAGNOSIS — K219 Gastro-esophageal reflux disease without esophagitis: Secondary | ICD-10-CM | POA: Diagnosis present

## 2016-04-27 DIAGNOSIS — J329 Chronic sinusitis, unspecified: Secondary | ICD-10-CM | POA: Diagnosis present

## 2016-04-27 DIAGNOSIS — Z825 Family history of asthma and other chronic lower respiratory diseases: Secondary | ICD-10-CM

## 2016-04-27 LAB — I-STAT BETA HCG BLOOD, ED (MC, WL, AP ONLY)

## 2016-04-27 LAB — CBC
HCT: 42 % (ref 36.0–46.0)
HEMOGLOBIN: 14.4 g/dL (ref 12.0–15.0)
MCH: 30.4 pg (ref 26.0–34.0)
MCHC: 34.3 g/dL (ref 30.0–36.0)
MCV: 88.8 fL (ref 78.0–100.0)
Platelets: 270 10*3/uL (ref 150–400)
RBC: 4.73 MIL/uL (ref 3.87–5.11)
RDW: 13.7 % (ref 11.5–15.5)
WBC: 10.4 10*3/uL (ref 4.0–10.5)

## 2016-04-27 LAB — D-DIMER, QUANTITATIVE: D-Dimer, Quant: 1.15 ug/mL-FEU — ABNORMAL HIGH (ref 0.00–0.50)

## 2016-04-27 LAB — BASIC METABOLIC PANEL
Anion gap: 13 (ref 5–15)
BUN: 10 mg/dL (ref 6–20)
CHLORIDE: 105 mmol/L (ref 101–111)
CO2: 19 mmol/L — ABNORMAL LOW (ref 22–32)
CREATININE: 0.85 mg/dL (ref 0.44–1.00)
Calcium: 9.2 mg/dL (ref 8.9–10.3)
Glucose, Bld: 107 mg/dL — ABNORMAL HIGH (ref 65–99)
Potassium: 3.9 mmol/L (ref 3.5–5.1)
SODIUM: 137 mmol/L (ref 135–145)

## 2016-04-27 MED ORDER — METHYLPREDNISOLONE SODIUM SUCC 125 MG IJ SOLR
125.0000 mg | Freq: Once | INTRAMUSCULAR | Status: AC
  Administered 2016-04-27: 125 mg via INTRAVENOUS
  Filled 2016-04-27: qty 2

## 2016-04-27 MED ORDER — IOPAMIDOL (ISOVUE-370) INJECTION 76%
INTRAVENOUS | Status: AC
  Administered 2016-04-27: 100 mL
  Filled 2016-04-27: qty 100

## 2016-04-27 MED ORDER — ALBUTEROL (5 MG/ML) CONTINUOUS INHALATION SOLN
INHALATION_SOLUTION | RESPIRATORY_TRACT | Status: AC
  Administered 2016-04-27: 10 mg/h via RESPIRATORY_TRACT
  Filled 2016-04-27: qty 20

## 2016-04-27 MED ORDER — MAGNESIUM SULFATE 2 GM/50ML IV SOLN
2.0000 g | Freq: Once | INTRAVENOUS | Status: AC
  Administered 2016-04-27: 2 g via INTRAVENOUS
  Filled 2016-04-27: qty 50

## 2016-04-27 MED ORDER — IPRATROPIUM BROMIDE 0.02 % IN SOLN
RESPIRATORY_TRACT | Status: AC
  Administered 2016-04-27: 0.5 mg via RESPIRATORY_TRACT
  Filled 2016-04-27: qty 2.5

## 2016-04-27 MED ORDER — IPRATROPIUM BROMIDE 0.02 % IN SOLN
0.5000 mg | Freq: Once | RESPIRATORY_TRACT | Status: AC
  Administered 2016-04-27: 0.5 mg via RESPIRATORY_TRACT

## 2016-04-27 MED ORDER — IPRATROPIUM-ALBUTEROL 0.5-2.5 (3) MG/3ML IN SOLN
3.0000 mL | Freq: Four times a day (QID) | RESPIRATORY_TRACT | Status: DC
  Administered 2016-04-28: 3 mL via RESPIRATORY_TRACT
  Filled 2016-04-27: qty 3

## 2016-04-27 MED ORDER — DEXTROSE 5 % IV SOLN
1.0000 g | Freq: Once | INTRAVENOUS | Status: AC
  Administered 2016-04-27: 1 g via INTRAVENOUS
  Filled 2016-04-27: qty 10

## 2016-04-27 MED ORDER — ONDANSETRON HCL 4 MG/2ML IJ SOLN
4.0000 mg | Freq: Once | INTRAMUSCULAR | Status: AC
  Administered 2016-04-27: 4 mg via INTRAVENOUS
  Filled 2016-04-27: qty 2

## 2016-04-27 MED ORDER — HYDROMORPHONE HCL 1 MG/ML IJ SOLN
1.0000 mg | Freq: Once | INTRAMUSCULAR | Status: AC
  Administered 2016-04-27: 1 mg via INTRAVENOUS
  Filled 2016-04-27: qty 1

## 2016-04-27 MED ORDER — ALBUTEROL (5 MG/ML) CONTINUOUS INHALATION SOLN
10.0000 mg/h | INHALATION_SOLUTION | Freq: Once | RESPIRATORY_TRACT | Status: AC
  Administered 2016-04-27: 10 mg/h via RESPIRATORY_TRACT

## 2016-04-27 MED ORDER — IPRATROPIUM-ALBUTEROL 0.5-2.5 (3) MG/3ML IN SOLN: 3.0000 mL | RESPIRATORY_TRACT | Status: DC | PRN

## 2016-04-27 NOTE — ED Provider Notes (Signed)
[facility] DEPT Provider Note   CSN: [id number] Arrival date & time: 04/27/16  1704  By signing my name below, I, Arianna Nassar, attest that this documentation has been prepared under the direction and in the presence of Jenny Reichmann, MD working with  Lajean Saver, MD.  Electronically Signed: Julien Nordmann, ED Scribe. 04/27/16. 5:35 PM.  History   Chief Complaint Chief Complaint  Patient presents with  . Shortness of Breath   The history is provided by the patient. No language interpreter was used.   HPI Comments: Linda Dalton is a 41 y.o. female who has a PMhx of asthma presents to the Emergency Department complaining of moderate, progressively worsening asthma exacerbation that began PTA. Pt has associated shortness of breath and dry cough. She expresses that she never been intubated in the past for asthma flare ups. Pt was seen at [facility] ~ 1 month ago for severe asthma and was admitted for 3 days. She is currently taking doxycycline and has been compliant for 3 days. Pt is not on any anticoagulants.   Past Medical History:  Diagnosis Date  . Asthma   . DVT (deep venous thrombosis) ([place])   . Fibroid tumor   . PONV (postoperative nausea and vomiting)   . Presence of IVC filter     Patient Active Problem List   Diagnosis Date Noted  . Trimalleolar fracture of right ankle 02/02/2014  . DVT (deep venous thrombosis) ([place]) 05/01/2013  . Acute DVT (deep venous thrombosis) ([place]) 05/01/2013  . ASTHMA 02/04/2007  . G E REFLUX 02/04/2007    Past Surgical History:  Procedure Laterality Date  . ABDOMINAL HYSTERECTOMY    . BREAST SURGERY    . ORIF ANKLE FRACTURE Right 02/02/2014   Procedure: OPEN REDUCTION INTERNAL FIXATION (ORIF) RIGHT TRIMALLEOLAR ANKLE FRACTURE;  Surgeon: Marianna Payment, MD;  Location: [facility];  Service: Orthopedics;  Laterality: Right;  . TUBAL LIGATION      OB History    No data available       Home Medications    Prior to  Admission medications   Medication Sig Start Date End Date Taking? Authorizing Provider  albuterol (PROVENTIL) (2.5 MG/3ML) 0.083% nebulizer solution Take 3 mLs (2.5 mg total) by nebulization every 4 (four) hours as needed for wheezing or shortness of breath. 11/07/15   Domenic Moras, PA-C  BREO ELLIPTA 200-25 MCG/INH AEPB USE 1 INHALATION DAILY (WILL NEED APPOINTMENT FOR FURTHER REFILLS) 07/23/15   Brand Males, MD  BREO ELLIPTA 200-25 MCG/INH AEPB INHALE 1 PUFF INTO THE LUNGS DAILY. 02/12/16   Brand Males, MD  clindamycin (CLEOCIN) 300 MG capsule Take 1 capsule (300 mg total) by mouth 4 (four) times daily. X 10 days 12/25/15   Melynda Ripple, MD  fluticasone [facility]) 50 MCG/ACT nasal spray USE 2 SPRAYS IN EACH NOSTRIL DAILY (NEED APPOINTMENT FOR FURTHER REFILLS ) Patient not taking: Reported on 12/25/2015 07/23/15   Brand Males, MD  guaiFENesin (MUCINEX) 600 MG 12 hr tablet Take by mouth 2 (two) times daily.    Historical Provider, MD  ibuprofen (ADVIL,MOTRIN) 800 MG tablet Take 1 tablet (800 mg total) by mouth every 8 (eight) hours as needed. 12/25/15   Melynda Ripple, MD  predniSONE (STERAPRED UNI-PAK 21 TAB) 10 MG (21) TBPK tablet Dispense one 6 day pack. Take as directed with food. 12/25/15   Melynda Ripple, MD  PROAIR HFA 108 [phone] Base) MCG/ACT inhaler USE 1 TO 2 INHALATIONS EVERY 4 HOURS AS NEEDED FOR WHEEZING OR  SHORTNESS OF BREATH (WILL NEED APPOINTMENT FOR FURTHER REFILLS) 07/23/15   Brand Males, MD  PROAIR HFA 108 [phone] Base) MCG/ACT inhaler INHALE 1-2 PUFFS INTO THE LUNGS EVERY 4 (FOUR) HOURS AS NEEDED FOR WHEEZING OR SHORTNESS OF BREATH. 02/12/16   Brand Males, MD  Spacer/Aero-Holding Chambers (AEROCHAMBER PLUS WITH MASK) inhaler Use as instructed Patient not taking: Reported on 01/24/2015 12/25/14   Charlesetta Shanks, MD    Family History Family History  Problem Relation Age of Onset  . Congestive Heart Failure Mother   . Diabetes Mother   . Asthma Mother   .  Hypertension Mother   . Asthma Father   . Sleep apnea Father   . Hypercholesterolemia Father   . Heart murmur Sister   . Heart disease Brother   . Cancer Other   . Congestive Heart Failure Other     Social History Social History  Substance Use Topics  . Smoking status: Former Smoker    Packs/day: 1.00    Years: 8.00    Types: Cigarettes    Quit date: 07/14/2014  . Smokeless tobacco: Never Used  . Alcohol use 0.0 oz/week     Comment: rare     Allergies   Biaxin [clarithromycin] and Codeine   Review of Systems Review of Systems  Constitutional: Negative for chills and fever.  HENT: Negative for ear pain and sore throat.   Eyes: Negative for pain and visual disturbance.  Respiratory: Positive for cough, chest tightness, shortness of breath and wheezing.   Cardiovascular: Negative for chest pain and palpitations.  Gastrointestinal: Negative for abdominal pain and vomiting.  Genitourinary: Negative for dysuria and hematuria.  Musculoskeletal: Negative for arthralgias and back pain.  Skin: Negative for color change and rash.  Neurological: Negative for seizures and syncope.  All other systems reviewed and are negative.   All other systems reviewed and are negative for acute changes except as noted in the HPI.  Physical Exam Updated Vital Signs BP 127/88 (BP Location: Right Arm)   Pulse (!) 152   Temp 98.1 F (36.7 C)   Resp (!) 24   Ht 5\' 8"  (1.727 m)   Wt 210 lb (95.3 kg)   LMP 03/15/2013   SpO2 98%   BMI 31.93 kg/m   Physical Exam  Constitutional: She is oriented to person, place, and time. She appears well-developed and well-nourished. No distress.  HENT:  Head: Normocephalic and atraumatic.  Eyes: Conjunctivae are normal.  Neck: Neck supple.  Cardiovascular: Regular rhythm.   No murmur heard. Tachycardic  Pulmonary/Chest: She is in respiratory distress. She has wheezes.  Tachypneic, retracting  Abdominal: Soft. There is no tenderness.    Musculoskeletal: She exhibits no edema.  Neurological: She is alert and oriented to person, place, and time.  Skin: Skin is warm and dry.  Psychiatric: She has a normal mood and affect.  Nursing note and vitals reviewed.    ED Treatments / Results  COORDINATION OF CARE:  5:34 PM Discussed treatment plan with pt at bedside and pt agreed to plan.  Labs (all labs ordered are listed, but only abnormal results are displayed) Labs Reviewed  CBC  BASIC METABOLIC PANEL  I-STAT BETA HCG BLOOD, ED ([facility], [facility], [facility] ONLY)    EKG  EKG Interpretation None       Radiology No results found.  Procedures Procedures (including critical care time)  Medications Ordered in ED Medications  albuterol (PROVENTIL,VENTOLIN) solution continuous neb (not administered)  ipratropium (ATROVENT) nebulizer solution 0.5 mg (not  administered)  magnesium sulfate IVPB 2 g 50 mL (not administered)  methylPREDNISolone sodium succinate (SOLU-MEDROL) 125 mg/2 mL injection 125 mg (not administered)  ipratropium (ATROVENT) 0.02 % nebulizer solution (not administered)  albuterol (PROVENTIL, VENTOLIN) (5 MG/ML) 0.5% continuous inhalation solution (not administered)     Initial Impression / Assessment and Plan / ED Course  I have reviewed the triage vital signs and the nursing notes.  Pertinent labs & imaging results that were available during my care of the patient were reviewed by me and considered in my medical decision making (see chart for details).    Pt presents with an asthma exacerbation. Says she thinks the pollen brought on her symptoms; says they started after coming back from being outside earlier today. Endorses some midthoracic pain that is worsened by coughing; not normal for her asthma exacerbations. Denies F/C, HA, lightheadedness, CP, N/V/D, urinary symptoms.  VS & exam as above. Continuous nebulizer treatment, Solumedrol, and magnesium ordered. CXR w/right basilar opacity concerning for PNA;  Rocephin given in the ED (Pt already taking Doxycycline for acne). Labs remarkable for HCO3 19, d-dimer 1.15.  On re-evalution after breathing treatments, wheezing improved but still present. SaO2 in the low-mid 90s on 2L [place].  CTA negative for PE or PNA; no need to continue abx.   Will admit the Pt to the Hospitalist's service for further evaluation and treatment.  Final Clinical Impressions(s) / ED Diagnoses   Final diagnoses:  Moderate persistent asthma with exacerbation   I personally performed the services described in this documentation, which was scribed in my presence. The recorded information has been reviewed and is accurate.  New Prescriptions New Prescriptions   No medications on file     Jenny Reichmann, MD 04/28/16 0106    Jenny Reichmann, MD 04/28/16 [id number]    Lajean Saver, MD 04/30/16 [phone]

## 2016-04-27 NOTE — ED Notes (Signed)
Pt rushed back from triage; breathing labored in tripod position. RT and EDP at bedside.

## 2016-04-27 NOTE — ED Triage Notes (Signed)
Pt st's she has asthma and started having trouble breathing this am.  St;s has used her inhaler x's 2 without relief

## 2016-04-27 NOTE — ED Notes (Signed)
Pt taking off aerosol mask; appears to be breathing easier than prior assessment. Sats 89%, placed on 2 L [place]

## 2016-04-27 NOTE — Progress Notes (Signed)
Called to assess patient - scheduled Duonebs q6. Patient states that she is breathing a lot better. Auscultation revealed bilateral expiratory wheezes throughout and patient is also continuing to require 2L on nasal cannula with an SpO2 of 92-95%. RT reported assessment to assigned RN and resident (Dr. Ericka Pontiff). RT will continue to monitor as needed.

## 2016-04-28 ENCOUNTER — Encounter (HOSPITAL_COMMUNITY): Payer: Self-pay | Admitting: Family Medicine

## 2016-04-28 DIAGNOSIS — Z825 Family history of asthma and other chronic lower respiratory diseases: Secondary | ICD-10-CM | POA: Diagnosis not present

## 2016-04-28 DIAGNOSIS — Z79899 Other long term (current) drug therapy: Secondary | ICD-10-CM | POA: Diagnosis not present

## 2016-04-28 DIAGNOSIS — Z833 Family history of diabetes mellitus: Secondary | ICD-10-CM | POA: Diagnosis not present

## 2016-04-28 DIAGNOSIS — J45901 Unspecified asthma with (acute) exacerbation: Secondary | ICD-10-CM | POA: Diagnosis present

## 2016-04-28 DIAGNOSIS — J4541 Moderate persistent asthma with (acute) exacerbation: Principal | ICD-10-CM

## 2016-04-28 DIAGNOSIS — Z86718 Personal history of other venous thrombosis and embolism: Secondary | ICD-10-CM | POA: Diagnosis not present

## 2016-04-28 DIAGNOSIS — Z885 Allergy status to narcotic agent status: Secondary | ICD-10-CM | POA: Diagnosis not present

## 2016-04-28 DIAGNOSIS — J3089 Other allergic rhinitis: Secondary | ICD-10-CM | POA: Diagnosis not present

## 2016-04-28 DIAGNOSIS — J329 Chronic sinusitis, unspecified: Secondary | ICD-10-CM | POA: Diagnosis present

## 2016-04-28 DIAGNOSIS — J019 Acute sinusitis, unspecified: Secondary | ICD-10-CM

## 2016-04-28 DIAGNOSIS — Z883 Allergy status to other anti-infective agents status: Secondary | ICD-10-CM | POA: Diagnosis not present

## 2016-04-28 DIAGNOSIS — Z8249 Family history of ischemic heart disease and other diseases of the circulatory system: Secondary | ICD-10-CM | POA: Diagnosis not present

## 2016-04-28 DIAGNOSIS — K219 Gastro-esophageal reflux disease without esophagitis: Secondary | ICD-10-CM | POA: Diagnosis present

## 2016-04-28 DIAGNOSIS — Z87891 Personal history of nicotine dependence: Secondary | ICD-10-CM | POA: Diagnosis not present

## 2016-04-28 LAB — BASIC METABOLIC PANEL
Anion gap: 8 (ref 5–15)
BUN: 12 mg/dL (ref 6–20)
CO2: 22 mmol/L (ref 22–32)
Calcium: 9 mg/dL (ref 8.9–10.3)
Chloride: 107 mmol/L (ref 101–111)
Creatinine, Ser: 0.76 mg/dL (ref 0.44–1.00)
GFR calc Af Amer: >60 mL/min (ref >60)
GFR calc non Af Amer: >60 mL/min (ref >60)
GLUCOSE: 155 mg/dL — AB (ref 65–99)
POTASSIUM: 4.4 mmol/L (ref 3.5–5.1)
Sodium: 137 mmol/L (ref 135–145)

## 2016-04-28 MED ORDER — FLUTICASONE FUROATE-VILANTEROL 100-25 MCG/INH IN AEPB
1.0000 | INHALATION_SPRAY | Freq: Every day | RESPIRATORY_TRACT | Status: DC
  Administered 2016-04-28 – 2016-04-29 (×2): 1 via RESPIRATORY_TRACT
  Filled 2016-04-28: qty 28

## 2016-04-28 MED ORDER — DOXYCYCLINE HYCLATE 100 MG PO TABS
100.0000 mg | ORAL_TABLET | Freq: Two times a day (BID) | ORAL | Status: DC
  Administered 2016-04-28 – 2016-04-29 (×3): 100 mg via ORAL
  Filled 2016-04-28 (×3): qty 1

## 2016-04-28 MED ORDER — SALINE SPRAY 0.65 % NA SOLN
1.0000 | NASAL | Status: DC | PRN
  Administered 2016-04-28: 1 via NASAL
  Filled 2016-04-28: qty 44

## 2016-04-28 MED ORDER — FLUTICASONE PROPIONATE 50 MCG/ACT NA SUSP
1.0000 | Freq: Every day | NASAL | Status: DC
  Administered 2016-04-28 – 2016-04-29 (×2): 1 via NASAL
  Filled 2016-04-28: qty 16

## 2016-04-28 MED ORDER — MONTELUKAST SODIUM 10 MG PO TABS
10.0000 mg | ORAL_TABLET | Freq: Every day | ORAL | Status: DC
  Administered 2016-04-28 – 2016-04-29 (×2): 10 mg via ORAL
  Filled 2016-04-28 (×2): qty 1

## 2016-04-28 MED ORDER — OXYMETAZOLINE HCL 0.05 % NA SOLN
1.0000 | Freq: Two times a day (BID) | NASAL | Status: DC
  Administered 2016-04-28 – 2016-04-29 (×3): 1 via NASAL
  Filled 2016-04-28: qty 15

## 2016-04-28 MED ORDER — METHYLPREDNISOLONE SODIUM SUCC 125 MG IJ SOLR
60.0000 mg | Freq: Two times a day (BID) | INTRAMUSCULAR | Status: DC
  Administered 2016-04-28 (×2): 60 mg via INTRAVENOUS
  Filled 2016-04-28 (×2): qty 2

## 2016-04-28 MED ORDER — ALBUTEROL SULFATE (2.5 MG/3ML) 0.083% IN NEBU: 2.5000 mg | INHALATION_SOLUTION | RESPIRATORY_TRACT | Status: DC | PRN

## 2016-04-28 MED ORDER — ENOXAPARIN SODIUM 40 MG/0.4ML ~~LOC~~ SOLN
40.0000 mg | SUBCUTANEOUS | Status: DC
  Administered 2016-04-28 – 2016-04-29 (×2): 40 mg via SUBCUTANEOUS
  Filled 2016-04-28 (×2): qty 0.4

## 2016-04-28 MED ORDER — ALBUTEROL SULFATE (2.5 MG/3ML) 0.083% IN NEBU
2.5000 mg | INHALATION_SOLUTION | Freq: Four times a day (QID) | RESPIRATORY_TRACT | Status: DC
  Administered 2016-04-28 – 2016-04-29 (×5): 2.5 mg via RESPIRATORY_TRACT
  Filled 2016-04-28 (×5): qty 3

## 2016-04-28 MED ORDER — GUAIFENESIN ER 600 MG PO TB12
1200.0000 mg | ORAL_TABLET | Freq: Two times a day (BID) | ORAL | Status: DC
  Administered 2016-04-28 – 2016-04-29 (×3): 1200 mg via ORAL
  Filled 2016-04-28 (×3): qty 2

## 2016-04-28 MED ORDER — LORATADINE 10 MG PO TABS
10.0000 mg | ORAL_TABLET | Freq: Every day | ORAL | Status: DC
  Administered 2016-04-28 – 2016-04-29 (×2): 10 mg via ORAL
  Filled 2016-04-28 (×2): qty 1

## 2016-04-28 MED ORDER — MOMETASONE FURO-FORMOTEROL FUM 100-5 MCG/ACT IN AERO
2.0000 | INHALATION_SPRAY | Freq: Two times a day (BID) | RESPIRATORY_TRACT | Status: DC
  Administered 2016-04-29: 2 via RESPIRATORY_TRACT
  Filled 2016-04-28: qty 8.8

## 2016-04-28 NOTE — Care Management Note (Signed)
Case Management Note  Patient Details  Name: Linda Dalton MRN: [id number] Date of Birth: 1975-03-08  Subjective/Objective:  Pt in with asthma exacerbation. She is from home.                 Action/Plan: Plan is for patient to return home when medically stable. CM following for d/c needs, physician orders.   Expected Discharge Date:                  Expected Discharge Plan:  Home/Self Care  In-House Referral:     Discharge planning Services     Post Acute Care Choice:    Choice offered to:     DME Arranged:    DME Agency:     HH Arranged:    HH Agency:     Status of Service:  In process, will continue to follow  If discussed at Long Length of Stay Meetings, dates discussed:    Additional Comments:  Pollie Friar, RN 04/28/2016, 10:25 AM

## 2016-04-28 NOTE — Progress Notes (Signed)
Pt admitted from ED via stretcher. Patient alert and oriented X4. No acute resp distress  Noted Denies any pain or discomfort. Pt welcomed and oriented to room and unit.

## 2016-04-28 NOTE — Progress Notes (Signed)
Patient seen and examined by me- still wheezing. Admitted after midnight, please see H&P.  Added abx for suspected right sided sinusitis.  Eulogio Bear DO

## 2016-04-28 NOTE — H&P (Signed)
History and Physical  Patient Name: Linda Dalton     DXA:[id number]    DOB: 1975/02/24    DOA: 04/27/2016 PCP: No PCP Per Patient   Patient coming from: Home  Chief Complaint: Dyspnea, wheezing, chest tightness  HPI: BESAN KETCHEM is a 41 y.o. female with a past medical history significant for moderate persistent asthma with allergic rhinitis who presents with acute dyspnea.  The patient was in her usual state of health until this afternoon, she had been outside for a while doing things and gradually started to have chest tightness, wheezing, and dyspnea over several hours. She tried albuterol twice without relief began to worsen, having severe chest tightness and shortness of breath, so she came to the emergency room.  She has missed a few doses of her Singulair this week because the mail order pharmacy hasn't filled it recently.  She takes Firefighter, Human resources officer, [facility] daily.  She has had no fever, chils, sputum production.  She has had no leg swelling, immobility, recent surgery.  ED course: -Afebrile, heart rate 152, respirations 24-30, blood pressure 127/88, pulse oximetry 99% on room air -Na 137, K 3.9, Cr 0.85, WBC 10.4K, Hgb 14 -D-dimer elevated -HCG negative -Chest x-ray showed questionable right base opacity, follow-up CT angiogram of the chest showed no PE and no pneumonia or other focal airspace consolidation -ECG showed sinus tachycardia -She was given nebulized bronchodilators, steroids, magnesium, fluids and TRH were asked to evaluate for asthma exacerbation; she did get one dose of empiric antibiotics after her chest x-ray     She was last admitted for 2 [facility] to [facility] for asthma exacerbation 2 months ago, precipitated by NSAID use.     ROS: Review of Systems  Constitutional: Negative for chills and fever.  Respiratory: Positive for cough, shortness of breath and wheezing. Negative for hemoptysis and sputum production.   Cardiovascular: Negative for chest pain,  orthopnea and leg swelling.  All other systems reviewed and are negative.         Past Medical History:  Diagnosis Date  . Asthma   . DVT (deep venous thrombosis) ([place])   . Fibroid tumor   . PONV (postoperative nausea and vomiting)   . Presence of IVC filter     Past Surgical History:  Procedure Laterality Date  . ABDOMINAL HYSTERECTOMY    . BREAST SURGERY    . ORIF ANKLE FRACTURE Right 02/02/2014   Procedure: OPEN REDUCTION INTERNAL FIXATION (ORIF) RIGHT TRIMALLEOLAR ANKLE FRACTURE;  Surgeon: Marianna Payment, MD;  Location: [place];  Service: Orthopedics;  Laterality: Right;  . TUBAL LIGATION      Social History: Patient lives with her daughter, age 29.  The patient walks unassisted.  She is a former smoker, quit two years ago.  She is a Dance movement psychotherapist, from [place], [place].  Allergies  Allergen Reactions  . Biaxin [Clarithromycin] Swelling, Hives and Shortness Of Breath    Mouth swelling  . Ibuprofen Shortness Of Breath  . Codeine Hives  . Naproxen Nausea And Vomiting    Family history: family history includes Asthma in her father and mother; Cancer in her other; Congestive Heart Failure in her mother and other; Diabetes in her mother; Heart disease in her brother; Heart murmur in her sister; Hypercholesterolemia in her father; Hypertension in her mother; Sleep apnea in her father.  Prior to Admission medications   Medication Sig Start Date End Date Taking? Authorizing Provider  albuterol (PROVENTIL) (2.5 MG/3ML) 0.083% nebulizer solution Take 3 mLs (  2.5 mg total) by nebulization every 4 (four) hours as needed for wheezing or shortness of breath. 11/07/15  Yes Domenic Moras, PA-C  fexofenadine (ALLEGRA) 60 MG tablet Take 60 mg by mouth 2 (two) times daily.   Yes Historical Provider, MD  fluticasone (FLONASE) 50 MCG/ACT nasal spray Place 1 spray into both nostrils daily.   Yes Historical Provider, MD  fluticasone furoate-vilanterol (BREO ELLIPTA) 100-25 MCG/INH AEPB Inhale 1 puff  into the lungs daily. 03/08/16  Yes Historical Provider, MD  montelukast (SINGULAIR) 10 MG tablet Take 10 mg by mouth daily. 02/15/16  Yes Historical Provider, MD  BREO ELLIPTA 200-25 MCG/INH AEPB USE 1 INHALATION DAILY (WILL NEED APPOINTMENT FOR FURTHER REFILLS) 07/23/15   Brand Males, MD  BREO ELLIPTA 200-25 MCG/INH AEPB INHALE 1 PUFF INTO THE LUNGS DAILY. 02/12/16   Brand Males, MD  clindamycin (CLEOCIN) 300 MG capsule Take 1 capsule (300 mg total) by mouth 4 (four) times daily. X 10 days 12/25/15   Melynda Ripple, MD  fluticasone [facility]) 50 MCG/ACT nasal spray USE 2 SPRAYS IN EACH NOSTRIL DAILY (NEED APPOINTMENT FOR FURTHER REFILLS ) Patient not taking: Reported on 12/25/2015 07/23/15   Brand Males, MD  guaiFENesin (MUCINEX) 600 MG 12 hr tablet Take by mouth 2 (two) times daily.    Historical Provider, MD  ibuprofen (ADVIL,MOTRIN) 800 MG tablet Take 1 tablet (800 mg total) by mouth every 8 (eight) hours as needed. 12/25/15   Melynda Ripple, MD  predniSONE (STERAPRED UNI-PAK 21 TAB) 10 MG (21) TBPK tablet Dispense one 6 day pack. Take as directed with food. 12/25/15   Melynda Ripple, MD  PROAIR HFA 108 [phone] Base) MCG/ACT inhaler USE 1 TO 2 INHALATIONS EVERY 4 HOURS AS NEEDED FOR WHEEZING OR SHORTNESS OF BREATH (WILL NEED APPOINTMENT FOR FURTHER REFILLS) 07/23/15   Brand Males, MD  PROAIR HFA 108 (90 Base) MCG/ACT inhaler INHALE 1-2 PUFFS INTO THE LUNGS EVERY 4 (FOUR) HOURS AS NEEDED FOR WHEEZING OR SHORTNESS OF BREATH. 02/12/16   Brand Males, MD  Spacer/Aero-Holding Chambers (AEROCHAMBER PLUS WITH MASK) inhaler Use as instructed Patient not taking: Reported on 01/24/2015 12/25/14   Charlesetta Shanks, MD       Physical Exam: BP 127/86   Pulse 98   Temp 98.1 F (36.7 C)   Resp 16   Ht 5\' 8"  (1.727 m)   Wt 95.3 kg (210 lb)   LMP 03/15/2013   SpO2 94%   BMI 31.93 kg/m  General appearance: Well-developed, adult female, alert and in mild distress from dyspnea.   Eyes:  Anicteric, conjunctiva pink, lids and lashes normal. PERRL.    ENT: No nasal deformity, discharge, epistaxis.  Hearing normal. OP moist without lesions.   Neck: No neck masses.  Trachea midline.  No thyromegaly/tenderness. Lymph: No cervical or supraclavicular lymphadenopathy. Skin: Warm and dry.  No jaundice.  No suspicious rashes or lesions. Cardiac: RRR, nl S1-S2, no murmurs appreciated.  Capillary refill is brisk.  JVP normal.  No LE edema.  Radial and DP pulses 2+ and symmetric. Respiratory: Respiratory rate has normalized at rest, using accessory muscles.  Diffuse wheezes.  Sounds tight. Abdomen: Abdomen soft.  No TTP. No ascites, distension, hepatosplenomegaly.   MSK: No deformities or effusions.  No cyanosis or clubbing. Neuro: Cranial nerves normal.  Sensation intact to light touchno. Speech is fluent.  Muscle strength normal.    Psych: Sensorium intact and responding to questions, attention normal.  Behavior appropriate.  Affect normal.  Judgment and insight appear normal.  Labs on Admission:  I have personally reviewed following labs and imaging studies: CBC:  Recent Labs Lab 04/27/16 1730  WBC 10.4  HGB 14.4  HCT 42.0  MCV 88.8  PLT 779   Basic Metabolic Panel:  Recent Labs Lab 04/27/16 1730  NA 137  K 3.9  CL 105  CO2 19*  GLUCOSE 107*  BUN 10  CREATININE 0.85  CALCIUM 9.2   GFR: Estimated Creatinine Clearance: 107.3 mL/min (by C-G formula based on SCr of 0.85 mg/dL).  Liver Function Tests: No results for input(s): AST, ALT, ALKPHOS, BILITOT, PROT, ALBUMIN in the last 168 hours. No results for input(s): LIPASE, AMYLASE in the last 168 hours. No results for input(s): AMMONIA in the last 168 hours. Coagulation Profile: No results for input(s): INR, PROTIME in the last 168 hours. Cardiac Enzymes: No results for input(s): CKTOTAL, CKMB, CKMBINDEX, TROPONINI in the last 168 hours. BNP (last 3 results) No results for input(s): PROBNP in the last 8760  hours. HbA1C: No results for input(s): HGBA1C in the last 72 hours. CBG: No results for input(s): GLUCAP in the last 168 hours. Lipid Profile: No results for input(s): CHOL, HDL, LDLCALC, TRIG, CHOLHDL, LDLDIRECT in the last 72 hours. Thyroid Function Tests: No results for input(s): TSH, T4TOTAL, FREET4, T3FREE, THYROIDAB in the last 72 hours. Anemia Panel: No results for input(s): VITAMINB12, FOLATE, FERRITIN, TIBC, IRON, RETICCTPCT in the last 72 hours. Sepsis Labs: Invalid input(s): PROCALCITONIN, LACTICIDVEN No results found for this or any previous visit (from the past 240 hour(s)).       Radiological Exams on Admission: Personally reviewed CXR shows right lower lobe opacity; CTA negative for PE or pneumonia: Ct Angio Chest Pe W Or Wo Contrast  Result Date: 04/28/2016 CLINICAL DATA:  Chest pain and shortness of breath. EXAM: CT ANGIOGRAPHY CHEST WITH CONTRAST TECHNIQUE: Multidetector CT imaging of the chest was performed using the standard protocol during bolus administration of intravenous contrast. Multiplanar CT image reconstructions and MIPs were obtained to evaluate the vascular anatomy. CONTRAST:  100 mL Isovue 370 COMPARISON:  12/05/2015 FINDINGS: Cardiovascular: Satisfactory opacification of the pulmonary arteries to the segmental level. No evidence of pulmonary embolism. Normal heart size. No pericardial effusion. Normal caliber thoracic aorta. No aortic dissection. Mediastinum/Nodes: No enlarged mediastinal, hilar, or axillary lymph nodes. Thyroid gland, trachea, and esophagus demonstrate no significant findings. Lungs/Pleura: Evaluation is limited due to respiratory motion artifact. There are areas of linear atelectasis in both lung bases. No focal consolidation or airspace disease. No pleural effusions. No pneumothorax. Airways are patent. Mild airways thickening may represent chronic bronchitis. Upper Abdomen: No acute abnormality. Musculoskeletal: No chest wall abnormality. No  acute or significant osseous findings. Review of the MIP images confirms the above findings. IMPRESSION: No evidence of significant pulmonary embolus. Linear atelectasis in the lung bases. No evidence of active pulmonary disease. Chronic bronchitic changes. Electronically Signed   By: Lucienne Capers M.D.   On: 04/28/2016 00:28   Dg Chest Portable 1 View  Result Date: 04/27/2016 CLINICAL DATA:  Shortness of breath with cough history of asthma EXAM: PORTABLE CHEST 1 VIEW COMPARISON:  03/05/2016 FINDINGS: Increased opacity at the right lung base. No pleural effusion. Normal heart size. No pneumothorax. IMPRESSION: Right basilar opacity may reflect atelectasis or a mild infiltrate. Electronically Signed   By: Donavan Foil M.D.   On: 04/27/2016 18:34    EKG: Independently reviewed. Rate 122, QTc 459, sinus tachycardai, no ST changes.        Assessment/Plan  1. Asthma exacerbation:  Never been intubated.  Former smoker.  Allergic component.  On medium dose ICS, good outpatient follow up.   -Solu-medrol 60 mg BID for now -Albuterol scheduled and PRN -Continue Breo, may step-up dose -Continue Flonase -Restart Singulair -Continue Allegra -Repeat BMP tomorrow after fluids to confirm bicarbonate normalized                  DVT prophylaxis: Lovenox  Code Status: FULL  Family Communication: None present  Disposition Plan: Anticipate IV steroids and re-evaluate tomorrow, may need inpatient care. Consults called: None Admission status: OBS At the point of initial evaluation, it is my clinical opinion that admission for OBSERVATION is reasonable and necessary because the patient's presenting complaints in the context of their chronic conditions represent sufficient risk of deterioration or significant morbidity to constitute reasonable grounds for close observation in the hospital setting, but that the patient may be medically stable for discharge from the hospital within 24 to 48  hours.    Medical decision making: Patient seen at 1:15AM on 04/28/2016.  The patient was discussed with Dr. Ericka Pontiff.  What exists of the patient's chart was reviewed in depth, outside records from [facility] were reviewed and summarized above.  Clinical condition: improved with current treatments, stable for medical surgical bed.        Edwin Dada [facility] Pager [phone]

## 2016-04-28 NOTE — ED Notes (Signed)
Dr. Danford at bedside  

## 2016-04-29 DIAGNOSIS — J3089 Other allergic rhinitis: Secondary | ICD-10-CM

## 2016-04-29 MED ORDER — FAMOTIDINE 20 MG PO TABS: 20.0000 mg | ORAL_TABLET | Freq: Two times a day (BID) | ORAL | Status: DC | PRN

## 2016-04-29 MED ORDER — SALINE SPRAY 0.65 % NA SOLN: 1.0000 | NASAL | 0 refills | Status: DC | PRN

## 2016-04-29 MED ORDER — ALUM & MAG HYDROXIDE-SIMETH 200-200-20 MG/5ML PO SUSP
30.0000 mL | Freq: Four times a day (QID) | ORAL | Status: DC | PRN
  Administered 2016-04-29: 30 mL via ORAL
  Filled 2016-04-29: qty 30

## 2016-04-29 MED ORDER — FAMOTIDINE 20 MG PO TABS: 20.0000 mg | ORAL_TABLET | Freq: Two times a day (BID) | ORAL | 0 refills | Status: DC | PRN

## 2016-04-29 MED ORDER — OXYMETAZOLINE HCL 0.05 % NA SOLN: 1.0000 | Freq: Two times a day (BID) | NASAL | 0 refills | Status: DC

## 2016-04-29 MED ORDER — ONDANSETRON HCL 4 MG/2ML IJ SOLN
4.0000 mg | Freq: Four times a day (QID) | INTRAMUSCULAR | Status: DC | PRN
  Administered 2016-04-29: 4 mg via INTRAVENOUS
  Filled 2016-04-29: qty 2

## 2016-04-29 MED ORDER — PREDNISONE 20 MG PO TABS: ORAL_TABLET | ORAL | 0 refills | Status: DC

## 2016-04-29 MED ORDER — PREDNISONE 20 MG PO TABS
60.0000 mg | ORAL_TABLET | Freq: Every day | ORAL | Status: DC
  Administered 2016-04-29: 60 mg via ORAL
  Filled 2016-04-29: qty 3

## 2016-04-29 MED ORDER — MONTELUKAST SODIUM 10 MG PO TABS: 10.0000 mg | ORAL_TABLET | Freq: Every day | ORAL | 0 refills | Status: DC

## 2016-04-29 MED ORDER — GUAIFENESIN ER 600 MG PO TB12: 1200.0000 mg | ORAL_TABLET | Freq: Two times a day (BID) | ORAL | Status: DC

## 2016-04-29 NOTE — Plan of Care (Signed)
Problem: Pain Managment: Goal: General experience of comfort will improve Outcome: Progressing Patient is independent. Sat in chair for several hours now laying in bed. Patient denies pain and discomfort. Will continue to monitor

## 2016-04-29 NOTE — Progress Notes (Signed)
Patient discharging home with family. Discharge information given, IV removed and prescriptions given. Patient questions asked and answered. Patient transported to exit via wheelchair and security to escort patient to her car. Wendee Copp

## 2016-04-29 NOTE — Care Management Note (Signed)
Case Management Note  Patient Details  Name: Linda Dalton MRN: [id number] Date of Birth: [birth year]  Subjective/Objective:                    Action/Plan: Pt discharging home with self care. No PCP listed. Pt states she goes to [facility] and sees Dr Ena Dawley. Pt states she has transportation home.   Expected Discharge Date:  04/29/16               Expected Discharge Plan:  Home/Self Care  In-House Referral:     Discharge planning Services     Post Acute Care Choice:    Choice offered to:     DME Arranged:    DME Agency:     HH Arranged:    HH Agency:     Status of Service:  Completed, signed off  If discussed at [employer] of Stay Meetings, dates discussed:    Additional Comments:  Pollie Friar, RN 04/29/2016, 10:43 AM

## 2016-04-29 NOTE — Progress Notes (Signed)
Patient had baked chicken for dinner and experienced heart burn. Team contacted, standing order maalox available, patient refused and asked for coke instead. Patient was able to burp, heart burn relieved. Will continue to monitor

## 2016-04-29 NOTE — Discharge Summary (Addendum)
Physician Discharge Summary  GWYNN HEIN Dalton:[id number] DOB: [birth year] DOA: 04/27/2016  PCP: No PCP Per Patient  Admit date: 04/27/2016 Discharge date: 04/29/2016   Recommendations for Outpatient Follow-Up:   Last note from pulm said: consideration for sinus CT, blood IgE and blood allergy panel   Discharge Diagnosis:   Principal Problem:   Moderate persistent asthma with exacerbation Active Problems:   Asthma exacerbation   Discharge disposition:  Home.   Discharge Condition: Improved.  Diet recommendation: Low sodium, heart healthy.  Wound care: None.   History of Present Illness:   Linda Dalton is a 41 y.o. female with a past medical history significant for moderate persistent asthma with allergic rhinitis who presents with acute dyspnea.  The patient was in her usual state of health until this afternoon, she had been outside for a while doing things and gradually started to have chest tightness, wheezing, and dyspnea over several hours. She tried albuterol twice without relief began to worsen, having severe chest tightness and shortness of breath, so she came to the emergency room.  She has missed a few doses of her Singulair this week because the mail order pharmacy hasn't filled it recently.  She takes Engineer, materials, Careers adviser, [employer] daily.  She has had no fever, chils, sputum production.  She has had no leg swelling, immobility, recent surgery.   Hospital Course by Problem:   Allergic rhinitis/asthma exacerbation -steroids x 5 days -continue abx as prior -continue home meds and add mucinex/nasal saline/afrin -close outpatient follow up with pulm for above recommendations    Medical Consultants:    None.   Discharge Exam:   Vitals:   04/29/16 0458 04/29/16 0905  BP: 125/87 125/71  Pulse: (!) 107 86  Resp: 20 18  Temp: 98.6 F (37 C)    Vitals:   04/29/16 0852 04/29/16 0853 04/29/16 0855 04/29/16 0905  BP:    125/71  Pulse:    86  Resp:     18  Temp:      TempSrc:      SpO2: 99% 99% 99% 97%  Weight:      Height:        Gen:  NAD   The results of significant diagnostics from this hospitalization (including imaging, microbiology, ancillary and laboratory) are listed below for reference.     Procedures and Diagnostic Studies:   Ct Angio Chest Pe W Or Wo Contrast  Result Date: 04/28/2016 CLINICAL DATA:  Chest pain and shortness of breath. EXAM: CT ANGIOGRAPHY CHEST WITH CONTRAST TECHNIQUE: Multidetector CT imaging of the chest was performed using the standard protocol during bolus administration of intravenous contrast. Multiplanar CT image reconstructions and MIPs were obtained to evaluate the vascular anatomy. CONTRAST:  100 mL Isovue 370 COMPARISON:  12/05/2015 FINDINGS: Cardiovascular: Satisfactory opacification of the pulmonary arteries to the segmental level. No evidence of pulmonary embolism. Normal heart size. No pericardial effusion. Normal caliber thoracic aorta. No aortic dissection. Mediastinum/Nodes: No enlarged mediastinal, hilar, or axillary lymph nodes. Thyroid gland, trachea, and esophagus demonstrate no significant findings. Lungs/Pleura: Evaluation is limited due to respiratory motion artifact. There are areas of linear atelectasis in both lung bases. No focal consolidation or airspace disease. No pleural effusions. No pneumothorax. Airways are patent. Mild airways thickening may represent chronic bronchitis. Upper Abdomen: No acute abnormality. Musculoskeletal: No chest wall abnormality. No acute or significant osseous findings. Review of the MIP images confirms the above findings. IMPRESSION: No evidence of significant pulmonary embolus. Linear atelectasis in the  lung bases. No evidence of active pulmonary disease. Chronic bronchitic changes. Electronically Signed   By: Burman Nieves M.D.   On: 04/28/2016 00:28   Dg Chest Portable 1 View  Result Date: 04/27/2016 CLINICAL DATA:  Shortness of breath with cough  history of asthma EXAM: PORTABLE CHEST 1 VIEW COMPARISON:  03/05/2016 FINDINGS: Increased opacity at the right lung base. No pleural effusion. Normal heart size. No pneumothorax. IMPRESSION: Right basilar opacity may reflect atelectasis or a mild infiltrate. Electronically Signed   By: Jasmine Pang M.D.   On: 04/27/2016 18:34     Labs:   Basic Metabolic Panel:  Recent Labs Lab 04/27/16 1730 04/28/16 0510  NA 137 137  K 3.9 4.4  CL 105 107  CO2 19* 22  GLUCOSE 107* 155*  BUN 10 12  CREATININE 0.85 0.76  CALCIUM 9.2 9.0   GFR Estimated Creatinine Clearance: 111.9 mL/min (by C-G formula based on SCr of 0.76 mg/dL). Liver Function Tests: No results for input(s): AST, ALT, ALKPHOS, BILITOT, PROT, ALBUMIN in the last 168 hours. No results for input(s): LIPASE, AMYLASE in the last 168 hours. No results for input(s): AMMONIA in the last 168 hours. Coagulation profile No results for input(s): INR, PROTIME in the last 168 hours.  CBC:  Recent Labs Lab 04/27/16 1730  WBC 10.4  HGB 14.4  HCT 42.0  MCV 88.8  PLT 270   Cardiac Enzymes: No results for input(s): CKTOTAL, CKMB, CKMBINDEX, TROPONINI in the last 168 hours. BNP: Invalid input(s): POCBNP CBG: No results for input(s): GLUCAP in the last 168 hours. D-Dimer  Recent Labs  04/27/16 2038  DDIMER 1.15*   Hgb A1c No results for input(s): HGBA1C in the last 72 hours. Lipid Profile No results for input(s): CHOL, HDL, LDLCALC, TRIG, CHOLHDL, LDLDIRECT in the last 72 hours. Thyroid function studies No results for input(s): TSH, T4TOTAL, T3FREE, THYROIDAB in the last 72 hours.  Invalid input(s): FREET3 Anemia work up No results for input(s): VITAMINB12, FOLATE, FERRITIN, TIBC, IRON, RETICCTPCT in the last 72 hours. Microbiology No results found for this or any previous visit (from the past 240 hour(s)).   Discharge Instructions:   Discharge Instructions    Diet general    Complete by:  As directed    Increase  activity slowly    Complete by:  As directed      Allergies as of 04/29/2016      Reactions   Biaxin [clarithromycin] Hives, Shortness Of Breath, Swelling, Other (See Comments)   Mouth swelling   Ibuprofen Shortness Of Breath   Codeine Hives   Naproxen Nausea And Vomiting      Medication List    STOP taking these medications   aerochamber plus with mask inhaler   clindamycin 300 MG capsule Commonly known as:  CLEOCIN   ibuprofen 800 MG tablet Commonly known as:  ADVIL,MOTRIN   predniSONE 10 MG (21) Tbpk tablet Commonly known as:  STERAPRED UNI-PAK 21 TAB Replaced by:  predniSONE 20 MG tablet     TAKE these medications   BREO ELLIPTA 200-25 MCG/INH Aepb Generic drug:  fluticasone furoate-vilanterol USE 1 INHALATION DAILY (WILL NEED APPOINTMENT FOR FURTHER REFILLS) What changed:  Another medication with the same name was removed. Continue taking this medication, and follow the directions you see here.   clindamycin 1 % external solution Commonly known as:  CLEOCIN T Apply 1 application topically 2 (two) times daily.   doxycycline 100 MG capsule Commonly known as:  VIBRAMYCIN Take 100 mg  by mouth 2 (two) times daily.   famotidine 20 MG tablet Commonly known as:  PEPCID Take 1 tablet (20 mg total) by mouth 2 (two) times daily as needed for heartburn.   fexofenadine 60 MG tablet Commonly known as:  ALLEGRA Take 60 mg by mouth daily.   fluticasone 50 MCG/ACT nasal spray Commonly known as:  FLONASE Place 2 sprays into both nostrils daily. What changed:  Another medication with the same name was removed. Continue taking this medication, and follow the directions you see here.   guaiFENesin 600 MG 12 hr tablet Commonly known as:  MUCINEX Take 2 tablets (1,200 mg total) by mouth 2 (two) times daily.   mometasone-formoterol 100-5 MCG/ACT Aero Commonly known as:  DULERA Inhale 2 puffs into the lungs 2 (two) times daily.   montelukast 10 MG tablet Commonly known as:   SINGULAIR Take 1 tablet (10 mg total) by mouth daily.   oxymetazoline 0.05 % nasal spray Commonly known as:  AFRIN Place 1 spray into both nostrils 2 (two) times daily. Use for 3 days   predniSONE 20 MG tablet Commonly known as:  DELTASONE 60 mg x 5 days Replaces:  predniSONE 10 MG (21) Tbpk tablet   PROAIR HFA 108 (90 Base) MCG/ACT inhaler Generic drug:  albuterol USE 1 TO 2 INHALATIONS EVERY 4 HOURS AS NEEDED FOR WHEEZING OR SHORTNESS OF BREATH (WILL NEED APPOINTMENT FOR FURTHER REFILLS) What changed:  Another medication with the same name was removed. Continue taking this medication, and follow the directions you see here.   albuterol (2.5 MG/3ML) 0.083% nebulizer solution Commonly known as:  PROVENTIL Take 3 mLs (2.5 mg total) by nebulization every 4 (four) hours as needed for wheezing or shortness of breath. What changed:  Another medication with the same name was removed. Continue taking this medication, and follow the directions you see here.   sodium chloride 0.65 % Soln nasal spray Commonly known as:  OCEAN Place 1 spray into both nostrils as needed for congestion.      Follow-up Information    PCP 1 week Follow up.            Time coordinating discharge: 35 min  Signed:  Tramane Gorum U Armida Vickroy   [facility] 04/29/2016, 9:07 AM

## 2016-04-29 NOTE — Progress Notes (Signed)
Patient threw up and felt nauseated afterward. Team notified, new order for zofran PRN obtained. Patient feels relieved with zofran without complaint at this time. Will continue to monitor and update oncoming nurse.

## 2016-08-15 ENCOUNTER — Other Ambulatory Visit: Payer: Self-pay | Admitting: Otolaryngology

## 2017-02-10 ENCOUNTER — Encounter (HOSPITAL_COMMUNITY): Payer: Self-pay

## 2017-02-10 ENCOUNTER — Emergency Department (HOSPITAL_COMMUNITY)
Admission: EM | Admit: 2017-02-10 | Discharge: 2017-02-10 | Disposition: A | Discharge status: Home / Self Care | Payer: Self-pay | Attending: Emergency Medicine | Admitting: Emergency Medicine

## 2017-02-10 ENCOUNTER — Other Ambulatory Visit: Payer: Self-pay

## 2017-02-10 DIAGNOSIS — Z86718 Personal history of other venous thrombosis and embolism: Secondary | ICD-10-CM | POA: Insufficient documentation

## 2017-02-10 DIAGNOSIS — J029 Acute pharyngitis, unspecified: Secondary | ICD-10-CM | POA: Insufficient documentation

## 2017-02-10 DIAGNOSIS — Z79899 Other long term (current) drug therapy: Secondary | ICD-10-CM | POA: Insufficient documentation

## 2017-02-10 DIAGNOSIS — J45909 Unspecified asthma, uncomplicated: Secondary | ICD-10-CM | POA: Insufficient documentation

## 2017-02-10 DIAGNOSIS — Z87891 Personal history of nicotine dependence: Secondary | ICD-10-CM | POA: Insufficient documentation

## 2017-02-10 DIAGNOSIS — J36 Peritonsillar abscess: Secondary | ICD-10-CM | POA: Insufficient documentation

## 2017-02-10 HISTORY — DX: Acute tonsillitis, unspecified: J03.90

## 2017-02-10 HISTORY — DX: Chronic sinusitis, unspecified: J32.9

## 2017-02-10 LAB — RAPID STREP SCREEN (MED CTR MEBANE ONLY): Streptococcus, Group A Screen (Direct): NEGATIVE

## 2017-02-10 MED ORDER — MAGIC MOUTHWASH W/LIDOCAINE: 5.0000 mL | Freq: Four times a day (QID) | ORAL | 0 refills | Status: DC | PRN

## 2017-02-10 MED ORDER — PREDNISONE 20 MG PO TABS: ORAL_TABLET | ORAL | 0 refills | Status: DC

## 2017-02-10 MED ORDER — AMOXICILLIN-POT CLAVULANATE 875-125 MG PO TABS: 1.0000 | ORAL_TABLET | Freq: Two times a day (BID) | ORAL | 0 refills | Status: DC

## 2017-02-10 MED ORDER — DEXAMETHASONE SODIUM PHOSPHATE 10 MG/ML IJ SOLN
10.0000 mg | Freq: Once | INTRAMUSCULAR | Status: AC
  Administered 2017-02-10: 10 mg via INTRAMUSCULAR
  Filled 2017-02-10: qty 1

## 2017-02-10 MED ORDER — CEFTRIAXONE SODIUM 1 G IJ SOLR
1.0000 g | Freq: Once | INTRAMUSCULAR | Status: AC
  Administered 2017-02-10: 1 g via INTRAMUSCULAR
  Filled 2017-02-10: qty 10

## 2017-02-10 MED ORDER — LIDOCAINE HCL 1 % IJ SOLN
INTRAMUSCULAR | Status: AC
  Administered 2017-02-10: 20 mL
  Filled 2017-02-10: qty 20

## 2017-02-10 NOTE — ED Provider Notes (Signed)
[facility] DEPT Provider Note   CSN: [id number] Arrival date & time: 02/10/17  0631     History   Chief Complaint Chief Complaint  Patient presents with  . Throat Pain    HPI Linda Dalton is a 42 y.o. female.  HPI Reports swelling and pain in the throat starting yesterday.  Pain worse on the right side with radiation into the right ear.  Pain with swallowing.  Subjective fever.  Patient has asthma.  She reports she has also started having increased coughing.  Last use of inhaler last night.  She reports was similar in the past she improved with "a shot" she got at urgent care and swish medication helped quite a bit.  This was about 4 months ago. Past Medical History:  Diagnosis Date  . Asthma   . DVT (deep venous thrombosis) ([place])   . Fibroid tumor   . PONV (postoperative nausea and vomiting)   . Presence of IVC filter   . Sinusitis   . Tonsillitis     Patient Active Problem List   Diagnosis Date Noted  . Asthma exacerbation 04/28/2016  . Moderate persistent asthma with exacerbation 03/05/2016  . Trimalleolar fracture of right ankle 02/02/2014  . DVT (deep venous thrombosis) ([place]) 05/01/2013  . Acute DVT (deep venous thrombosis) ([place]) 05/01/2013  . ASTHMA 02/04/2007  . G E REFLUX 02/04/2007    Past Surgical History:  Procedure Laterality Date  . ABDOMINAL HYSTERECTOMY    . BREAST SURGERY    . ORIF ANKLE FRACTURE Right 02/02/2014   Procedure: OPEN REDUCTION INTERNAL FIXATION (ORIF) RIGHT TRIMALLEOLAR ANKLE FRACTURE;  Surgeon: Marianna Payment, MD;  Location: [facility];  Service: Orthopedics;  Laterality: Right;  . TUBAL LIGATION      OB History    No data available       Home Medications    Prior to Admission medications   Medication Sig Start Date End Date Taking? Authorizing Provider  albuterol (PROVENTIL) (2.5 MG/3ML) 0.083% nebulizer solution Take 3 mLs (2.5 mg total) by nebulization every 4 (four) hours as needed for  wheezing or shortness of breath. 11/07/15   Domenic Moras, PA-C  amoxicillin-clavulanate (AUGMENTIN) 875-125 MG tablet Take 1 tablet by mouth 2 (two) times daily. One po bid x 7 days 02/10/17   Charlesetta Shanks, MD  BREO ELLIPTA 200-25 MCG/INH AEPB USE 1 INHALATION DAILY (WILL NEED APPOINTMENT FOR FURTHER REFILLS) 07/23/15   Brand Males, MD  clindamycin (CLEOCIN T) 1 % external solution Apply 1 application topically 2 (two) times daily.    [provider]  doxycycline (VIBRAMYCIN) 100 MG capsule Take 100 mg by mouth 2 (two) times daily. 04/25/16   [provider]  famotidine (PEPCID) 20 MG tablet Take 1 tablet (20 mg total) by mouth 2 (two) times daily as needed for heartburn. 04/29/16   Geradine Girt, DO  fexofenadine (ALLEGRA) 60 MG tablet Take 60 mg by mouth daily.     [provider]  fluticasone (FLONASE) 50 MCG/ACT nasal spray Place 2 sprays into both nostrils daily.     [provider]  guaiFENesin (MUCINEX) 600 MG 12 hr tablet Take 2 tablets (1,200 mg total) by mouth 2 (two) times daily. 04/29/16   Geradine Girt, DO  magic mouthwash w/lidocaine SOLN Take 5 mLs by mouth 4 (four) times daily as needed for mouth pain. 02/10/17   Charlesetta Shanks, MD  mometasone-formoterol (DULERA) 100-5 MCG/ACT AERO Inhale 2 puffs into the lungs 2 (  two) times daily.    [provider]  montelukast (SINGULAIR) 10 MG tablet Take 1 tablet (10 mg total) by mouth daily. 04/29/16   Geradine Girt, DO  oxymetazoline (AFRIN) 0.05 % nasal spray Place 1 spray into both nostrils 2 (two) times daily. Use for 3 days 04/29/16   Geradine Girt, DO  predniSONE (DELTASONE) 20 MG tablet 60 mg x 5 days 04/29/16   Geradine Girt, DO  predniSONE (DELTASONE) 20 MG tablet 2 tabs po daily x 4 days 02/12/17   Charlesetta Shanks, MD  PROAIR HFA 108 [phone] Base) MCG/ACT inhaler USE 1 TO 2 INHALATIONS EVERY 4 HOURS AS NEEDED FOR WHEEZING OR SHORTNESS OF BREATH (WILL NEED APPOINTMENT FOR FURTHER REFILLS)  07/23/15   Brand Males, MD  sodium chloride (OCEAN) 0.65 % SOLN nasal spray Place 1 spray into both nostrils as needed for congestion. 04/29/16   Geradine Girt, DO    Family History Family History  Problem Relation Age of Onset  . Congestive Heart Failure Mother   . Diabetes Mother   . Asthma Mother   . Hypertension Mother   . Asthma Father   . Sleep apnea Father   . Hypercholesterolemia Father   . Heart murmur Sister   . Heart disease Brother   . Cancer Other   . Congestive Heart Failure Other     Social History Social History   Tobacco Use  . Smoking status: Former Smoker    Packs/day: 1.00    Years: 8.00    Pack years: 8.00    Types: Cigarettes    Last attempt to quit: 07/14/2014    Years since quitting: 2.5  . Smokeless tobacco: Never Used  Substance Use Topics  . Alcohol use: Yes    Alcohol/week: 0.0 oz    Comment: rare  . Drug use: No     Allergies   Biaxin [clarithromycin]; Ibuprofen; Codeine; and Naproxen   Review of Systems Review of Systems 10 Systems reviewed and are negative for acute change except as noted in the HPI.  Physical Exam Updated Vital Signs BP (!) 136/101 (BP Location: Left Arm)   Pulse (!) 112   Temp 99.2 F (37.3 C) (Oral)   Resp (!) 22   LMP 03/15/2013   SpO2 94%   Physical Exam  Constitutional: She is oriented to person, place, and time. She appears well-developed and well-nourished.  Patient is alert and nontoxic.  No respiratory distress.  She appears mildly ill and uncomfortable.  HENT:  Right tonsil moderately erythematous and enlarged compared to left which is erythematous.  No appearance of bulging or fluctuance in the superior pole of the tonsil.  No displacement of the uvula.  Bilateral TMs are dull and opacified but not erythematous.  Neck is supple with tenderness to palpation in the right peritonsillar area.  Eyes: EOM are normal. Pupils are equal, round, and reactive to light.  Cardiovascular: Normal rate,  regular rhythm, normal heart sounds and intact distal pulses.  Pulmonary/Chest:  No respiratory distress.  Diffuse wheeze.  Adequate airflow to the bases.  Abdominal: Soft. She exhibits no distension. There is no tenderness. There is no guarding.  Musculoskeletal: Normal range of motion. She exhibits no edema or tenderness.  Neurological: She is alert and oriented to person, place, and time. No cranial nerve deficit. She exhibits normal muscle tone. Coordination normal.  Skin: Skin is warm and dry. No rash noted.  Psychiatric: She has a normal mood and affect.  ED Treatments / Results  Labs (all labs ordered are listed, but only abnormal results are displayed) Labs Reviewed  RAPID STREP SCREEN (NOT AT [facility])  CULTURE, GROUP A STREP [facility])    EKG  EKG Interpretation None       Radiology No results found.  Procedures Procedures (including critical care time)  Medications Ordered in ED Medications  dexamethasone (DECADRON) injection 10 mg (10 mg Intramuscular Given 02/10/17 0826)  cefTRIAXone (ROCEPHIN) injection 1 g (1 g Intramuscular Given 02/10/17 0826)  lidocaine (XYLOCAINE) 1 % (with pres) injection (20 mLs  Given 02/10/17 0826)     Initial Impression / Assessment and Plan / ED Course  I have reviewed the triage vital signs and the nursing notes.  Pertinent labs & imaging results that were available during my care of the patient were reviewed by me and considered in my medical decision making (see chart for details).      Final Clinical Impressions(s) / ED Diagnoses   Final diagnoses:  Pharyngitis, unspecified etiology  Abscess, peritonsillar  Presents with 1 day of symptoms.  Subjective fever.  She does have asymmetric enlargement and erythema of the right tonsil.  At this time, there is no appearance of bulging or fluctuance at the superior pole that was suggest appropriate timing for I&D.  Patient's airway is widely patent.  This time will initiate treatment  for early peritonsillar abscess.  Return precautions reviewed.  Patient also has asthma.  She is having mild exacerbation without any respiratory distress.  I have administered Decadron and will have patient continue 3 more days of prednisone.  She is counseled to use her albuterol inhaler every 4-6 hours for the next 3 days.  ED Discharge Orders        Ordered    predniSONE (DELTASONE) 20 MG tablet     02/10/17 0821    amoxicillin-clavulanate (AUGMENTIN) 875-125 MG tablet  2 times daily     02/10/17 0820    magic mouthwash w/lidocaine SOLN  4 times daily PRN     02/10/17 0820       Charlesetta Shanks, MD 02/10/17 0830

## 2017-02-10 NOTE — Discharge Instructions (Signed)
1.  Schedule a follow-up appointment with the ear nose throat specialist listed in your discharge instructions. 2.  Take all medications as prescribed.  Use your albuterol inhaler every 4-6 hours for the next 3 days. 3.  Return to the emergency department if your symptoms are worsening or changing.

## 2017-02-10 NOTE — ED Triage Notes (Signed)
Pt reports 10/10 throat pain, redness, and irritation. Pt reports hx of tonsillitis. Pt A+OX4, NAD.

## 2017-02-12 LAB — CULTURE, GROUP A STREP (THRC)

## 2017-04-11 ENCOUNTER — Emergency Department (HOSPITAL_COMMUNITY): Admission: EM | Admit: 2017-04-11 | Discharge: 2017-04-11 | Discharge status: Absent without leave | Payer: Self-pay

## 2017-04-16 ENCOUNTER — Other Ambulatory Visit: Payer: Self-pay

## 2017-04-16 ENCOUNTER — Emergency Department (HOSPITAL_COMMUNITY)
Admission: EM | Admit: 2017-04-16 | Discharge: 2017-04-16 | Disposition: A | Discharge status: Home / Self Care | Payer: Self-pay | Attending: Emergency Medicine | Admitting: Emergency Medicine

## 2017-04-16 ENCOUNTER — Emergency Department (HOSPITAL_COMMUNITY): Payer: Self-pay

## 2017-04-16 ENCOUNTER — Encounter (HOSPITAL_COMMUNITY): Payer: Self-pay | Admitting: *Deleted

## 2017-04-16 DIAGNOSIS — Z79899 Other long term (current) drug therapy: Secondary | ICD-10-CM | POA: Insufficient documentation

## 2017-04-16 DIAGNOSIS — J0100 Acute maxillary sinusitis, unspecified: Secondary | ICD-10-CM | POA: Insufficient documentation

## 2017-04-16 DIAGNOSIS — J4521 Mild intermittent asthma with (acute) exacerbation: Secondary | ICD-10-CM | POA: Insufficient documentation

## 2017-04-16 DIAGNOSIS — Z87891 Personal history of nicotine dependence: Secondary | ICD-10-CM | POA: Insufficient documentation

## 2017-04-16 LAB — CBC WITH DIFFERENTIAL/PLATELET
BASOS ABS: 0.1 10*3/uL (ref 0.0–0.1)
Basophils Relative: 1 %
Eosinophils Absolute: 1.7 10*3/uL — ABNORMAL HIGH (ref 0.0–0.7)
Eosinophils Relative: 26 %
HEMATOCRIT: 41 % (ref 36.0–46.0)
HEMOGLOBIN: 13.8 g/dL (ref 12.0–15.0)
LYMPHS PCT: 37 %
Lymphs Abs: 2.4 10*3/uL (ref 0.7–4.0)
MCH: 30.4 pg (ref 26.0–34.0)
MCHC: 33.7 g/dL (ref 30.0–36.0)
MCV: 90.3 fL (ref 78.0–100.0)
MONOS PCT: 5 %
Monocytes Absolute: 0.3 10*3/uL (ref 0.1–1.0)
Neutro Abs: 2 10*3/uL (ref 1.7–7.7)
Neutrophils Relative %: 31 %
Platelets: 262 10*3/uL (ref 150–400)
RBC: 4.54 MIL/uL (ref 3.87–5.11)
RDW: 14.1 % (ref 11.5–15.5)
WBC: 6.5 10*3/uL (ref 4.0–10.5)

## 2017-04-16 LAB — BASIC METABOLIC PANEL
ANION GAP: 10 (ref 5–15)
BUN: 8 mg/dL (ref 6–20)
CO2: 22 mmol/L (ref 22–32)
Calcium: 8.9 mg/dL (ref 8.9–10.3)
Chloride: 105 mmol/L (ref 101–111)
Creatinine, Ser: 0.67 mg/dL (ref 0.44–1.00)
GFR calc Af Amer: >60 mL/min (ref >60)
Glucose, Bld: 92 mg/dL (ref 65–99)
POTASSIUM: 4 mmol/L (ref 3.5–5.1)
SODIUM: 137 mmol/L (ref 135–145)

## 2017-04-16 LAB — I-STAT BETA HCG BLOOD, ED (MC, WL, AP ONLY): I-stat hCG, quantitative: <5 m[IU]/mL (ref <5)

## 2017-04-16 MED ORDER — PREDNISONE 20 MG PO TABS
60.0000 mg | ORAL_TABLET | Freq: Once | ORAL | Status: AC
  Administered 2017-04-16: 60 mg via ORAL
  Filled 2017-04-16: qty 3

## 2017-04-16 MED ORDER — AMOXICILLIN-POT CLAVULANATE 875-125 MG PO TABS: 1.0000 | ORAL_TABLET | Freq: Two times a day (BID) | ORAL | 0 refills | Status: DC

## 2017-04-16 MED ORDER — IPRATROPIUM-ALBUTEROL 0.5-2.5 (3) MG/3ML IN SOLN
RESPIRATORY_TRACT | Status: AC
  Filled 2017-04-16: qty 3

## 2017-04-16 MED ORDER — IPRATROPIUM-ALBUTEROL 0.5-2.5 (3) MG/3ML IN SOLN
3.0000 mL | Freq: Once | RESPIRATORY_TRACT | Status: AC
  Administered 2017-04-16: 3 mL via RESPIRATORY_TRACT

## 2017-04-16 MED ORDER — PREDNISONE 20 MG PO TABS: ORAL_TABLET | ORAL | 0 refills | Status: DC

## 2017-04-16 NOTE — ED Triage Notes (Addendum)
Pt in c/o left earache, states she started using some drops a few days ago, yesterday noted increased cough and wheezing, also dizziness at times- this morning symptoms have not improved after increased inhaler and breathing tx use, pt wheezing in triage, speaking in full sentences

## 2017-04-16 NOTE — ED Provider Notes (Signed)
[facility] EMERGENCY DEPARTMENT Provider Note   CSN: [id number] Arrival date & time: 04/16/17  0809     History   Chief Complaint Chief Complaint  Patient presents with  . Cough  . Otalgia  . Dizziness    HPI Linda Dalton is a 42 y.o. female.  HPI   42 year old female with hx of asthma, DVT with IVC filter, presenting with left ear and facial pain.  For the past 5 days, patient report pain primarily in her left ear and left sinus.  Described as a water rushing sensation in the left ear with sharp 8 out of 10 pain involving the left side of face.  Endorsed intermittent bouts of dizziness in which he report room spinning sensation lasting for a few seconds, brought on by positional change.  She endorsed sinus congestion on the left side as well as green drainage coming from the right side of nares.  She endorsed mild sore throat.  She also report having increased chest congestion and wheezing requiring using her nebulizing treatment 3 times daily for the past 5 days as opposed to twice daily in the past.  Patient has been using multiple over-the-counter medication including Flonase, Sudafed without adequate relief.  No report of fever, neck pain, worsening shortness of breath or leg swelling.  She report having sinus surgery performed by Dr. Redmond Baseman last year.  Does have history of recurrent sinusitis.  Patient also recently moved in with his son who is a smoker.  Patient is not a smoker.   Past Medical History:  Diagnosis Date  . Asthma   . DVT (deep venous thrombosis) ([place])   . Fibroid tumor   . PONV (postoperative nausea and vomiting)   . Presence of IVC filter   . Sinusitis   . Tonsillitis     Patient Active Problem List   Diagnosis Date Noted  . Asthma exacerbation 04/28/2016  . Moderate persistent asthma with exacerbation 03/05/2016  . Trimalleolar fracture of right ankle 02/02/2014  . DVT (deep venous thrombosis) ([place]) 05/01/2013  . Acute DVT  (deep venous thrombosis) ([place]) 05/01/2013  . ASTHMA 02/04/2007  . G E REFLUX 02/04/2007    Past Surgical History:  Procedure Laterality Date  . ABDOMINAL HYSTERECTOMY    . BREAST SURGERY    . ORIF ANKLE FRACTURE Right 02/02/2014   Procedure: OPEN REDUCTION INTERNAL FIXATION (ORIF) RIGHT TRIMALLEOLAR ANKLE FRACTURE;  Surgeon: Marianna Payment, MD;  Location: [facility];  Service: Orthopedics;  Laterality: Right;  . TUBAL LIGATION       OB History   None      Home Medications    Prior to Admission medications   Medication Sig Start Date End Date Taking? Authorizing Provider  albuterol (PROVENTIL) (2.5 MG/3ML) 0.083% nebulizer solution Take 3 mLs (2.5 mg total) by nebulization every 4 (four) hours as needed for wheezing or shortness of breath. 11/07/15   Domenic Moras, PA-C  amoxicillin-clavulanate (AUGMENTIN) 875-125 MG tablet Take 1 tablet by mouth 2 (two) times daily. One po bid x 7 days 02/10/17   Charlesetta Shanks, MD  BREO ELLIPTA 200-25 MCG/INH AEPB USE 1 INHALATION DAILY (WILL NEED APPOINTMENT FOR FURTHER REFILLS) 07/23/15   Brand Males, MD  clindamycin (CLEOCIN T) 1 % external solution Apply 1 application topically 2 (two) times daily.    [provider]  doxycycline (VIBRAMYCIN) 100 MG capsule Take 100 mg by mouth 2 (two) times daily. 04/25/16   [provider]  famotidine (  PEPCID) 20 MG tablet Take 1 tablet (20 mg total) by mouth 2 (two) times daily as needed for heartburn. 04/29/16   Geradine Girt, DO  fexofenadine (ALLEGRA) 60 MG tablet Take 60 mg by mouth daily.     [provider]  fluticasone (FLONASE) 50 MCG/ACT nasal spray Place 2 sprays into both nostrils daily.     [provider]  guaiFENesin (MUCINEX) 600 MG 12 hr tablet Take 2 tablets (1,200 mg total) by mouth 2 (two) times daily. 04/29/16   Geradine Girt, DO  magic mouthwash w/lidocaine SOLN Take 5 mLs by mouth 4 (four) times daily as needed for mouth pain. 02/10/17   Charlesetta Shanks, MD  mometasone-formoterol (DULERA) 100-5 MCG/ACT AERO Inhale 2 puffs into the lungs 2 (two) times daily.    [provider]  montelukast (SINGULAIR) 10 MG tablet Take 1 tablet (10 mg total) by mouth daily. 04/29/16   Geradine Girt, DO  oxymetazoline (AFRIN) 0.05 % nasal spray Place 1 spray into both nostrils 2 (two) times daily. Use for 3 days 04/29/16   Geradine Girt, DO  predniSONE (DELTASONE) 20 MG tablet 60 mg x 5 days 04/29/16   Geradine Girt, DO  predniSONE (DELTASONE) 20 MG tablet 2 tabs po daily x 4 days 02/12/17   Charlesetta Shanks, MD  PROAIR HFA 108 [phone] Base) MCG/ACT inhaler USE 1 TO 2 INHALATIONS EVERY 4 HOURS AS NEEDED FOR WHEEZING OR SHORTNESS OF BREATH (WILL NEED APPOINTMENT FOR FURTHER REFILLS) 07/23/15   Brand Males, MD  sodium chloride (OCEAN) 0.65 % SOLN nasal spray Place 1 spray into both nostrils as needed for congestion. 04/29/16   Geradine Girt, DO    Family History Family History  Problem Relation Age of Onset  . Congestive Heart Failure Mother   . Diabetes Mother   . Asthma Mother   . Hypertension Mother   . Asthma Father   . Sleep apnea Father   . Hypercholesterolemia Father   . Heart murmur Sister   . Heart disease Brother   . Cancer Other   . Congestive Heart Failure Other     Social History Social History   Tobacco Use  . Smoking status: Former Smoker    Packs/day: 1.00    Years: 8.00    Pack years: 8.00    Types: Cigarettes    Last attempt to quit: 07/14/2014    Years since quitting: 2.7  . Smokeless tobacco: Never Used  Substance Use Topics  . Alcohol use: Yes    Alcohol/week: 0.0 oz    Comment: rare  . Drug use: No     Allergies   Biaxin [clarithromycin]; Ibuprofen; Codeine; and Naproxen   Review of Systems Review of Systems  All other systems reviewed and are negative.    Physical Exam Updated Vital Signs BP 119/82 (BP Location: Left Arm)   Pulse 75   Temp 99.3 F (37.4 C) (Oral)   Resp 16   LMP  03/15/2013   SpO2 99%   Physical Exam  Constitutional: She is oriented to person, place, and time. She appears well-developed and well-nourished. No distress.  HENT:  Head: Atraumatic.  Ears: Normal TMs bilaterally, no evidence of cerumen impaction Nose: Normal nares Throat: Uvula midline no tonsillar enlargement or exudates Tenderness to percussion to left maxillary sinus.  No evidence of mastoiditis or TMJ.  Eyes: Pupils are equal, round, and reactive to light. Conjunctivae and EOM are normal.  No nystagmus  Neck: Normal  range of motion. Neck supple. No JVD present.  Cardiovascular: Normal rate and regular rhythm.  Pulmonary/Chest: Effort normal. She has wheezes (Faint expiratory wheezes heard, no rales or rhonchi).  Abdominal: Soft. Bowel sounds are normal. She exhibits no distension. There is no tenderness.  Lymphadenopathy:    She has no cervical adenopathy.  Neurological: She is alert and oriented to person, place, and time. She has normal strength. No cranial nerve deficit or sensory deficit. GCS eye subscore is 4. GCS verbal subscore is 5. GCS motor subscore is 6.  Skin: No rash noted.  Psychiatric: She has a normal mood and affect.  Nursing note and vitals reviewed.    ED Treatments / Results  Labs (all labs ordered are listed, but only abnormal results are displayed) Labs Reviewed  CBC WITH DIFFERENTIAL/PLATELET - Abnormal; Notable for the following components:      Result Value   Eosinophils Absolute 1.7 (*)    All other components within normal limits  BASIC METABOLIC PANEL  I-STAT BETA HCG BLOOD, ED ([facility], [facility], [facility] ONLY)    EKG None  Radiology Dg Chest 2 View  Result Date: 04/16/2017 CLINICAL DATA:  Earache for 5 days.  Asthma. EXAM: CHEST - 2 VIEW COMPARISON:  Chest radiograph 04/27/2016. FINDINGS: The heart size and mediastinal contours are within normal limits. Both lungs are clear. The visualized skeletal structures are unremarkable. IMPRESSION: No active  cardiopulmonary disease.  Improved aeration from priors. Electronically Signed   By: Staci Righter M.D.   On: 04/16/2017 09:18    Procedures Procedures (including critical care time)  Medications Ordered in ED Medications  ipratropium-albuterol (DUONEB) 0.5-2.5 (3) MG/3ML nebulizer solution (has no administration in time range)  ipratropium-albuterol (DUONEB) 0.5-2.5 (3) MG/3ML nebulizer solution 3 mL (3 mLs Nebulization Given 04/16/17 6387)     Initial Impression / Assessment and Plan / ED Course  I have reviewed the triage vital signs and the nursing notes.  Pertinent labs & imaging results that were available during my care of the patient were reviewed by me and considered in my medical decision making (see chart for details).     BP (!) 115/92   Pulse 87   Temp 99.3 F (37.4 C) (Oral)   Resp 16   LMP 03/15/2013   SpO2 100%    Final Clinical Impressions(s) / ED Diagnoses   Final diagnoses:  Acute maxillary sinusitis, recurrence not specified  Mild intermittent asthma with exacerbation    ED Discharge Orders        Ordered    predniSONE (DELTASONE) 20 MG tablet     04/16/17 1352    amoxicillin-clavulanate (AUGMENTIN) 875-125 MG tablet  2 times daily     04/16/17 1352     1:05 PM Patient with history of recurrent sinusitis and history of asthma here with left ear pain and facial pain as well as sinus congestion.  Symptoms ongoing for the past week.  She does have some tenderness to the precautions of her left maxillary sinus.  Her ear exam is unremarkable.  She does have some faint wheezes.  Suspect sinusitis causing his symptoms.  At this time given the fact that patient has tried numerous medication at home including allergy medication without relief, will provide compression taper course of steroid as well as Augmentin in the treatment of sinusitis.  I have low suspicion for PE or stroke causing her symptoms.  She is able to ambulate without difficulty.   Domenic Moras,  PA-C 04/16/17 1353  Sherwood Gambler, MD 04/16/17 [phone]

## 2017-05-14 ENCOUNTER — Emergency Department (HOSPITAL_COMMUNITY)
Admission: EM | Admit: 2017-05-14 | Discharge: 2017-05-14 | Disposition: A | Discharge status: Home / Self Care | Payer: Self-pay | Attending: Emergency Medicine | Admitting: Emergency Medicine

## 2017-05-14 ENCOUNTER — Other Ambulatory Visit: Payer: Self-pay

## 2017-05-14 ENCOUNTER — Emergency Department (HOSPITAL_COMMUNITY): Payer: Self-pay

## 2017-05-14 ENCOUNTER — Encounter (HOSPITAL_COMMUNITY): Payer: Self-pay

## 2017-05-14 DIAGNOSIS — J4531 Mild persistent asthma with (acute) exacerbation: Secondary | ICD-10-CM | POA: Insufficient documentation

## 2017-05-14 DIAGNOSIS — Z79899 Other long term (current) drug therapy: Secondary | ICD-10-CM | POA: Insufficient documentation

## 2017-05-14 DIAGNOSIS — Z87891 Personal history of nicotine dependence: Secondary | ICD-10-CM | POA: Insufficient documentation

## 2017-05-14 MED ORDER — IPRATROPIUM-ALBUTEROL 0.5-2.5 (3) MG/3ML IN SOLN
3.0000 mL | Freq: Once | RESPIRATORY_TRACT | Status: AC
  Administered 2017-05-14: 3 mL via RESPIRATORY_TRACT
  Filled 2017-05-14: qty 3

## 2017-05-14 MED ORDER — ALBUTEROL SULFATE (2.5 MG/3ML) 0.083% IN NEBU
5.0000 mg | INHALATION_SOLUTION | Freq: Once | RESPIRATORY_TRACT | Status: AC
  Administered 2017-05-14: 5 mg via RESPIRATORY_TRACT
  Filled 2017-05-14: qty 6

## 2017-05-14 MED ORDER — ALBUTEROL SULFATE (5 MG/ML) 0.5% IN NEBU: 2.5000 mg | INHALATION_SOLUTION | Freq: Four times a day (QID) | RESPIRATORY_TRACT | 12 refills | Status: DC | PRN

## 2017-05-14 MED ORDER — PREDNISONE 50 MG PO TABS: 50.0000 mg | ORAL_TABLET | Freq: Every day | ORAL | 0 refills | Status: DC

## 2017-05-14 MED ORDER — FLUTICASONE PROPIONATE 50 MCG/ACT NA SUSP: 2.0000 | Freq: Every day | NASAL | 0 refills | Status: DC

## 2017-05-14 MED ORDER — PREDNISONE 20 MG PO TABS
60.0000 mg | ORAL_TABLET | Freq: Once | ORAL | Status: AC
  Administered 2017-05-14: 60 mg via ORAL
  Filled 2017-05-14: qty 3

## 2017-05-14 NOTE — ED Triage Notes (Signed)
Patient complains of asthma exacerbation since last pm. Coughing with wheezing and increased congestion. Used neb and inhaler pta

## 2017-05-14 NOTE — Discharge Instructions (Addendum)
Please read attached information. If you experience any new or worsening signs or symptoms please return to the emergency room for evaluation. Please follow-up with your primary care provider or specialist as discussed. Please use medication prescribed only as directed and discontinue taking if you have any concerning signs or symptoms.   °

## 2017-05-14 NOTE — ED Provider Notes (Signed)
[facility] EMERGENCY DEPARTMENT Provider Note   CSN: [id number] Arrival date & time: 05/14/17  0347     History   Chief Complaint Chief Complaint  Patient presents with  . asthma/cough    HPI GENNETTE Dalton is a 42 y.o. female.  HPI   42 year old female presents today with complaints of asthma exacerbation.  Patient notes she has baseline difficulty with her asthma that is usually resolved with breathing treatments at home.  Patient notes she takes Symbicort, Breo, and albuterol at home as needed.  She reports that tonight after going outside she had increasing of her symptoms with chest tightness, wheezing.  She notes she woke up this morning took her breathing treatment and went out to run some errands and noticed tightness in her chest and worsening symptoms.  She notes this is identical to previous history of asthma.  Patient denies any fever or infectious etiology.  Patient notes in the past steroids have improved her symptoms.  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  Past Medical History:  Diagnosis Date  . Asthma   . DVT (deep venous thrombosis) ([place])   . Fibroid tumor   . PONV (postoperative nausea and vomiting)   . Presence of IVC filter   . Sinusitis   . Tonsillitis     Patient Active Problem List   Diagnosis Date Noted  . Asthma exacerbation 04/28/2016  . Moderate persistent asthma with exacerbation 03/05/2016  . Trimalleolar fracture of right ankle 02/02/2014  . DVT (deep venous thrombosis) ([place]) 05/01/2013  . Acute DVT (deep venous thrombosis) ([place]) 05/01/2013  . ASTHMA 02/04/2007  . G E REFLUX 02/04/2007    Past Surgical History:  Procedure Laterality Date  . ABDOMINAL HYSTERECTOMY    . BREAST SURGERY    . ORIF ANKLE FRACTURE Right 02/02/2014   Procedure: OPEN REDUCTION INTERNAL FIXATION (ORIF) RIGHT TRIMALLEOLAR ANKLE FRACTURE;  Surgeon: Marianna Payment, MD;   Location: [facility];  Service: Orthopedics;  Laterality: Right;  . TUBAL LIGATION       OB History   None      Home Medications    Prior to Admission medications   Medication Sig Start Date End Date Taking? Authorizing Provider  albuterol (PROVENTIL) (5 MG/ML) 0.5% nebulizer solution Take 0.5 mLs (2.5 mg total) by nebulization every 6 (six) hours as needed for wheezing or shortness of breath. 05/14/17   Lilas Diefendorf, Dellis Filbert, PA-C  amoxicillin-clavulanate (AUGMENTIN) 875-125 MG tablet Take 1 tablet by mouth 2 (two) times daily. One po bid x 7 days 04/16/17   Domenic Moras, PA-C  BREO ELLIPTA 200-25 MCG/INH AEPB USE 1 INHALATION DAILY (WILL NEED APPOINTMENT FOR FURTHER REFILLS) 07/23/15   Brand Males, MD  clindamycin (CLEOCIN T) 1 % external solution Apply 1 application topically 2 (two) times daily.    [provider]  doxycycline (VIBRAMYCIN) 100 MG capsule Take 100 mg by mouth 2 (two) times daily. 04/25/16   [provider]  famotidine (PEPCID) 20 MG tablet Take 1 tablet (20 mg total) by mouth 2 (two) times daily as needed for heartburn. 04/29/16   Geradine Girt, DO  fexofenadine (ALLEGRA) 60 MG tablet Take 60 mg by mouth daily.     [provider]  fluticasone (FLONASE) 50 MCG/ACT nasal spray Place 2 sprays into both nostrils daily. 05/14/17   Broadus Costilla, Dellis Filbert, PA-C  guaiFENesin (MUCINEX) 600 MG 12 hr tablet Take 2 tablets (  1,200 mg total) by mouth 2 (two) times daily. 04/29/16   Geradine Girt, DO  magic mouthwash w/lidocaine SOLN Take 5 mLs by mouth 4 (four) times daily as needed for mouth pain. 02/10/17   Charlesetta Shanks, MD  mometasone-formoterol (DULERA) 100-5 MCG/ACT AERO Inhale 2 puffs into the lungs 2 (two) times daily.    [provider]  montelukast (SINGULAIR) 10 MG tablet Take 1 tablet (10 mg total) by mouth daily. 04/29/16   Geradine Girt, DO  oxymetazoline (AFRIN) 0.05 % nasal spray Place 1 spray into both nostrils 2 (two) times daily. Use for 3 days  04/29/16   Geradine Girt, DO  predniSONE (DELTASONE) 50 MG tablet Take 1 tablet (50 mg total) by mouth daily. 05/14/17   Curtisha Bendix, Dellis Filbert, PA-C  sodium chloride (OCEAN) 0.65 % SOLN nasal spray Place 1 spray into both nostrils as needed for congestion. 04/29/16   Geradine Girt, DO    Family History Family History  Problem Relation Age of Onset  . Congestive Heart Failure Mother   . Diabetes Mother   . Asthma Mother   . Hypertension Mother   . Asthma Father   . Sleep apnea Father   . Hypercholesterolemia Father   . Heart murmur Sister   . Heart disease Brother   . Cancer Other   . Congestive Heart Failure Other     Social History Social History   Tobacco Use  . Smoking status: Former Smoker    Packs/day: 1.00    Years: 8.00    Pack years: 8.00    Types: Cigarettes    Last attempt to quit: 07/14/2014    Years since quitting: 2.8  . Smokeless tobacco: Never Used  Substance Use Topics  . Alcohol use: Yes    Alcohol/week: 0.0 oz    Comment: rare  . Drug use: No     Allergies   Biaxin [clarithromycin]; Ibuprofen; Codeine; and Naproxen   Review of Systems Review of Systems  All other systems reviewed and are negative.    Physical Exam Updated Vital Signs BP 121/83   Pulse 78   Temp 98.6 F (37 C) (Oral)   Resp 20   LMP 03/15/2013   SpO2 100%   Physical Exam  Constitutional: She is oriented to person, place, and time. She appears well-developed and well-nourished.  HENT:  Head: Normocephalic and atraumatic.  Eyes: Pupils are equal, round, and reactive to light. Conjunctivae are normal. Right eye exhibits no discharge. Left eye exhibits no discharge. No scleral icterus.  Neck: Normal range of motion. No JVD present. No tracheal deviation present.  Pulmonary/Chest: Effort normal. No stridor.  Minor bilateral lower lobe expiratory wheeze no crackles, no respiratory distress  Neurological: She is alert and oriented to person, place, and time. Coordination normal.    Psychiatric: She has a normal mood and affect. Her behavior is normal. Judgment and thought content normal.  Nursing note and vitals reviewed.    ED Treatments / Results  Labs (all labs ordered are listed, but only abnormal results are displayed) Labs Reviewed - No data to display  EKG None  Radiology Dg Chest 2 View  Result Date: 05/14/2017 CLINICAL DATA:  Asthma exacerbation.  Shortness of breath. EXAM: CHEST - 2 VIEW COMPARISON:  04/16/2017. FINDINGS: Mediastinum hilar structures normal. Mild bibasilar subsegmental atelectasis and/or scarring. No acute infiltrates. Stable pleural thickening consistent with scarring. No pleural effusion pneumothorax. Heart size normal. No acute bony abnormality. IMPRESSION: Mild bibasilar subsegmental atelectasis and/or  scarring. No acute infiltrates. Electronically Signed   By: Marcello Moores  Register   On: 05/14/2017 10:23    Procedures Procedures (including critical care time)  Medications Ordered in ED Medications  albuterol (PROVENTIL) (2.5 MG/3ML) 0.083% nebulizer solution 5 mg (5 mg Nebulization Given 05/14/17 0956)  ipratropium-albuterol (DUONEB) 0.5-2.5 (3) MG/3ML nebulizer solution 3 mL (3 mLs Nebulization Given 05/14/17 1256)  predniSONE (DELTASONE) tablet 60 mg (60 mg Oral Given 05/14/17 1255)     Initial Impression / Assessment and Plan / ED Course  I have reviewed the triage vital signs and the nursing notes.  Pertinent labs & imaging results that were available during my care of the patient were reviewed by me and considered in my medical decision making (see chart for details).      Final Clinical Impressions(s) / ED Diagnoses   Final diagnoses:  Mild persistent asthma with exacerbation    Labs:   Imaging: DG chest 2 view  Consults: albuterol   Therapeutics:  Discharge Meds: Albuterol, prednisone, Flonase  Assessment/Plan: 42 year old female presents today with complaints of asthma exacerbation.  This is typical of her  previous.  Patient has received breathing treatment prior to my evaluation, she notes taking for over the last day.  At this point patient is in no respiratory distress, has minimal wheeze on exam and has reassuring oxygen saturation.  I do feel that with persistent wheeze she would benefit from steroid therapy.  She will be discharged with prednisone, albuterol, and close outpatient follow-up with strict return precautions.  History of diabetes.  Patient verbalized her understanding and agreement to today's plan had no further questions or concerns at the time discharge.   ED Discharge Orders        Ordered    fluticasone (FLONASE) 50 MCG/ACT nasal spray  Daily     05/14/17 1248    predniSONE (DELTASONE) 50 MG tablet  Daily     05/14/17 1248    albuterol (PROVENTIL) (5 MG/ML) 0.5% nebulizer solution  Every 6 hours PRN     05/14/17 1248       Okey Regal, PA-C 05/14/17 1325    Cardama, Grayce Sessions, MD 05/15/17 1011

## 2017-06-24 ENCOUNTER — Encounter (HOSPITAL_COMMUNITY): Payer: Self-pay | Admitting: Emergency Medicine

## 2017-06-24 ENCOUNTER — Emergency Department (HOSPITAL_COMMUNITY)
Admission: EM | Admit: 2017-06-24 | Discharge: 2017-06-24 | Disposition: A | Discharge status: Home / Self Care | Payer: Self-pay | Attending: Emergency Medicine | Admitting: Emergency Medicine

## 2017-06-24 ENCOUNTER — Emergency Department (HOSPITAL_COMMUNITY): Payer: Self-pay

## 2017-06-24 DIAGNOSIS — Z79899 Other long term (current) drug therapy: Secondary | ICD-10-CM | POA: Insufficient documentation

## 2017-06-24 DIAGNOSIS — R0602 Shortness of breath: Secondary | ICD-10-CM | POA: Insufficient documentation

## 2017-06-24 DIAGNOSIS — Z87891 Personal history of nicotine dependence: Secondary | ICD-10-CM | POA: Insufficient documentation

## 2017-06-24 DIAGNOSIS — J4531 Mild persistent asthma with (acute) exacerbation: Secondary | ICD-10-CM | POA: Insufficient documentation

## 2017-06-24 DIAGNOSIS — J4541 Moderate persistent asthma with (acute) exacerbation: Secondary | ICD-10-CM

## 2017-06-24 LAB — BASIC METABOLIC PANEL
ANION GAP: 9 (ref 5–15)
BUN: 10 mg/dL (ref 6–20)
CALCIUM: 9.2 mg/dL (ref 8.9–10.3)
CHLORIDE: 110 mmol/L (ref 101–111)
CO2: 20 mmol/L — AB (ref 22–32)
Creatinine, Ser: 0.76 mg/dL (ref 0.44–1.00)
GFR calc non Af Amer: >60 mL/min (ref >60)
GLUCOSE: 96 mg/dL (ref 65–99)
POTASSIUM: 3.7 mmol/L (ref 3.5–5.1)
Sodium: 139 mmol/L (ref 135–145)

## 2017-06-24 LAB — CBC
HEMATOCRIT: 43.5 % (ref 36.0–46.0)
HEMOGLOBIN: 14.4 g/dL (ref 12.0–15.0)
MCH: 30.5 pg (ref 26.0–34.0)
MCHC: 33.1 g/dL (ref 30.0–36.0)
MCV: 92.2 fL (ref 78.0–100.0)
Platelets: 282 10*3/uL (ref 150–400)
RBC: 4.72 MIL/uL (ref 3.87–5.11)
RDW: 14 % (ref 11.5–15.5)
WBC: 8.2 10*3/uL (ref 4.0–10.5)

## 2017-06-24 LAB — I-STAT BETA HCG BLOOD, ED (MC, WL, AP ONLY): I-stat hCG, quantitative: <5 m[IU]/mL (ref <5)

## 2017-06-24 LAB — I-STAT TROPONIN, ED: TROPONIN I, POC: 0 ng/mL (ref 0.00–0.08)

## 2017-06-24 MED ORDER — MAGNESIUM SULFATE 2 GM/50ML IV SOLN
2.0000 g | Freq: Once | INTRAVENOUS | Status: AC
  Administered 2017-06-24: 2 g via INTRAVENOUS
  Filled 2017-06-24: qty 50

## 2017-06-24 MED ORDER — ALBUTEROL SULFATE (2.5 MG/3ML) 0.083% IN NEBU
5.0000 mg | INHALATION_SOLUTION | Freq: Once | RESPIRATORY_TRACT | Status: AC
  Administered 2017-06-24: 5 mg via RESPIRATORY_TRACT
  Filled 2017-06-24: qty 6

## 2017-06-24 MED ORDER — DOXYCYCLINE HYCLATE 100 MG PO CAPS: 100.0000 mg | ORAL_CAPSULE | Freq: Two times a day (BID) | ORAL | 0 refills | Status: DC

## 2017-06-24 MED ORDER — PREDNISONE 10 MG PO TABS: 20.0000 mg | ORAL_TABLET | Freq: Two times a day (BID) | ORAL | 0 refills | Status: DC

## 2017-06-24 MED ORDER — METHYLPREDNISOLONE SODIUM SUCC 125 MG IJ SOLR
125.0000 mg | Freq: Once | INTRAMUSCULAR | Status: AC
  Administered 2017-06-24: 125 mg via INTRAVENOUS
  Filled 2017-06-24: qty 2

## 2017-06-24 MED ORDER — ALBUTEROL (5 MG/ML) CONTINUOUS INHALATION SOLN
10.0000 mg/h | INHALATION_SOLUTION | Freq: Once | RESPIRATORY_TRACT | Status: AC
  Administered 2017-06-24: 10 mg/h via RESPIRATORY_TRACT
  Filled 2017-06-24: qty 20

## 2017-06-24 NOTE — ED Provider Notes (Signed)
[facility] EMERGENCY DEPARTMENT Provider Note   CSN: [id number] Arrival date & time: 06/24/17  0050     History   Chief Complaint Chief Complaint  Patient presents with  . Shortness of Breath  . Chest Pain    HPI Linda Dalton is a 42 y.o. female.  Patient is a 42 year old female with past medical history of asthma, DVT with IVC filter, and prior hysterectomy presenting with shortness of breath.  This is been lingering for the past several weeks.  She has been seen previously at an ER in [place] while visiting.  She was given breathing treatments, steroids, and magnesium.  She was doing better, however her breathing has once again worsened.  She describes wheezing and cough that is nonproductive.  She denies any fevers or chills.  The history is provided by the patient.  Shortness of Breath  This is a recurrent problem. The average episode lasts 2 days. The problem occurs continuously.The problem has been rapidly worsening. Pertinent negatives include no leg swelling. It is unknown what precipitated the problem. Treatments tried: Albuterol nebulizer. The treatment provided mild relief. She has had prior ED visits. Associated medical issues include asthma.    Past Medical History:  Diagnosis Date  . Asthma   . DVT (deep venous thrombosis) ([place])   . Fibroid tumor   . PONV (postoperative nausea and vomiting)   . Presence of IVC filter   . Sinusitis   . Tonsillitis     Patient Active Problem List   Diagnosis Date Noted  . Asthma exacerbation 04/28/2016  . Moderate persistent asthma with exacerbation 03/05/2016  . Trimalleolar fracture of right ankle 02/02/2014  . DVT (deep venous thrombosis) ([facility]) 05/01/2013  . Acute DVT (deep venous thrombosis) ([facility]) 05/01/2013  . ASTHMA 02/04/2007  . G E REFLUX 02/04/2007    Past Surgical History:  Procedure Laterality Date  . ABDOMINAL HYSTERECTOMY    . BREAST SURGERY    . ORIF ANKLE FRACTURE Right 02/02/2014    Procedure: OPEN REDUCTION INTERNAL FIXATION (ORIF) RIGHT TRIMALLEOLAR ANKLE FRACTURE;  Surgeon: Marianna Payment, MD;  Location: [facility];  Service: Orthopedics;  Laterality: Right;  . TUBAL LIGATION       OB History   None      Home Medications    Prior to Admission medications   Medication Sig Start Date End Date Taking? Authorizing Provider  albuterol (PROVENTIL) (5 MG/ML) 0.5% nebulizer solution Take 0.5 mLs (2.5 mg total) by nebulization every 6 (six) hours as needed for wheezing or shortness of breath. 05/14/17   Hedges, Dellis Filbert, PA-C  amoxicillin-clavulanate (AUGMENTIN) 875-125 MG tablet Take 1 tablet by mouth 2 (two) times daily. One po bid x 7 days 04/16/17   Domenic Moras, PA-C  BREO ELLIPTA 200-25 MCG/INH AEPB USE 1 INHALATION DAILY (WILL NEED APPOINTMENT FOR FURTHER REFILLS) 07/23/15   Brand Males, MD  clindamycin (CLEOCIN T) 1 % external solution Apply 1 application topically 2 (two) times daily.    [provider]  doxycycline (VIBRAMYCIN) 100 MG capsule Take 100 mg by mouth 2 (two) times daily. 04/25/16   [provider]  famotidine (PEPCID) 20 MG tablet Take 1 tablet (20 mg total) by mouth 2 (two) times daily as needed for heartburn. 04/29/16   Geradine Girt, DO  fexofenadine (ALLEGRA) 60 MG tablet Take 60 mg by mouth daily.     [provider]  fluticasone (FLONASE) 50 MCG/ACT nasal spray Place 2 sprays into both nostrils daily.  05/14/17   Hedges, Dellis Filbert, PA-C  guaiFENesin (MUCINEX) 600 MG 12 hr tablet Take 2 tablets (1,200 mg total) by mouth 2 (two) times daily. 04/29/16   Geradine Girt, DO  magic mouthwash w/lidocaine SOLN Take 5 mLs by mouth 4 (four) times daily as needed for mouth pain. 02/10/17   Charlesetta Shanks, MD  mometasone-formoterol (DULERA) 100-5 MCG/ACT AERO Inhale 2 puffs into the lungs 2 (two) times daily.    [provider]  montelukast (SINGULAIR) 10 MG tablet Take 1 tablet (10 mg total) by mouth daily. 04/29/16   Geradine Girt, DO  oxymetazoline (AFRIN) 0.05 % nasal spray Place 1 spray into both nostrils 2 (two) times daily. Use for 3 days 04/29/16   Geradine Girt, DO  predniSONE (DELTASONE) 50 MG tablet Take 1 tablet (50 mg total) by mouth daily. 05/14/17   Hedges, Dellis Filbert, PA-C  sodium chloride (OCEAN) 0.65 % SOLN nasal spray Place 1 spray into both nostrils as needed for congestion. 04/29/16   Geradine Girt, DO    Family History Family History  Problem Relation Age of Onset  . Congestive Heart Failure Mother   . Diabetes Mother   . Asthma Mother   . Hypertension Mother   . Asthma Father   . Sleep apnea Father   . Hypercholesterolemia Father   . Heart murmur Sister   . Heart disease Brother   . Cancer Other   . Congestive Heart Failure Other     Social History Social History   Tobacco Use  . Smoking status: Former Smoker    Packs/day: 1.00    Years: 8.00    Pack years: 8.00    Types: Cigarettes    Last attempt to quit: 07/14/2014    Years since quitting: 2.9  . Smokeless tobacco: Never Used  Substance Use Topics  . Alcohol use: Yes    Alcohol/week: 0.0 oz    Comment: rare  . Drug use: No     Allergies   Biaxin [clarithromycin]; Ibuprofen; Codeine; and Naproxen   Review of Systems Review of Systems  Cardiovascular: Negative for leg swelling.  All other systems reviewed and are negative.    Physical Exam Updated Vital Signs BP (!) 135/101 (BP Location: Right Arm)   Pulse (!) 117   Resp 18   LMP 03/15/2013   SpO2 97%   Physical Exam  Constitutional: She is oriented to person, place, and time. She appears well-developed and well-nourished. No distress.  HENT:  Head: Normocephalic and atraumatic.  Neck: Normal range of motion. Neck supple.  Cardiovascular: Normal rate and regular rhythm. Exam reveals no gallop and no friction rub.  No murmur heard. Pulmonary/Chest: She is in respiratory distress. She has wheezes in the right middle field and the left middle field.    She is in moderate respiratory distress.  There are inspiratory and expiratory wheezes bilaterally.  Abdominal: Soft. Bowel sounds are normal. She exhibits no distension. There is no tenderness.  Musculoskeletal: Normal range of motion.  Neurological: She is alert and oriented to person, place, and time.  Skin: Skin is warm and dry. She is not diaphoretic.  Nursing note and vitals reviewed.    ED Treatments / Results  Labs (all labs ordered are listed, but only abnormal results are displayed) Labs Reviewed  BASIC METABOLIC PANEL  CBC  I-STAT TROPONIN, ED  I-STAT BETA HCG BLOOD, ED ([facility], [facility], [facility] ONLY)  I-STAT BETA HCG BLOOD, ED ([facility], [facility], [facility] ONLY)  ED ECG REPORT   Date: 06/24/2017  Rate: 111  Rhythm: sinus tachycardia  QRS Axis: right  Intervals: normal  ST/T Wave abnormalities: nonspecific T wave changes  Conduction Disutrbances:none  Narrative Interpretation:   Old EKG Reviewed: unchanged  I have personally reviewed the EKG tracing and agree with the computerized printout as noted.   Radiology No results found.  Procedures Procedures (including critical care time)  Medications Ordered in ED Medications  albuterol (PROVENTIL) (2.5 MG/3ML) 0.083% nebulizer solution 5 mg (has no administration in time range)  methylPREDNISolone sodium succinate (SOLU-MEDROL) 125 mg/2 mL injection 125 mg (has no administration in time range)  magnesium sulfate IVPB 2 g 50 mL (has no administration in time range)  albuterol (PROVENTIL,VENTOLIN) solution continuous neb (has no administration in time range)     Initial Impression / Assessment and Plan / ED Course  I have reviewed the triage vital signs and the nursing notes.  Pertinent labs & imaging results that were available during my care of the patient were reviewed by me and considered in my medical decision making (see chart for details).  Patient presenting with wheezing and moderate respiratory distress.  This appears related  to an exacerbation of asthma.  Her chest x-ray is clear and laboratory studies are reassuring.  She was given an hour-long treatment along with steroids and magnesium and is now feeling better.  I feel as though she will be appropriate for discharge with a course of prednisone, continued albuterol nebs, and Zithromax.  CRITICAL CARE Performed by: Veryl Speak Total critical care time: 35 minutes Critical care time was exclusive of separately billable procedures and treating other patients. Critical care was necessary to treat or prevent imminent or life-threatening deterioration. Critical care was time spent personally by me on the following activities: development of treatment plan with patient and/or surrogate as well as nursing, discussions with consultants, evaluation of patient's response to treatment, examination of patient, obtaining history from patient or surrogate, ordering and performing treatments and interventions, ordering and review of laboratory studies, ordering and review of radiographic studies, pulse oximetry and re-evaluation of patient's condition.   Final Clinical Impressions(s) / ED Diagnoses   Final diagnoses:  SOB (shortness of breath)    ED Discharge Orders    None       Veryl Speak, MD 06/24/17 [phone]

## 2017-06-24 NOTE — ED Notes (Signed)
Pt departed in NAD, refused use of wheelchair.  

## 2017-06-24 NOTE — ED Triage Notes (Signed)
Pt reports sudden onset of SOB, hx of breathing problems, several ED visits in the past two weeks.  Pt diaphoric and very labored in triage.

## 2017-06-24 NOTE — Discharge Instructions (Addendum)
Prednisone and Zithromax as prescribed.  Albuterol nebs every 4 hours as needed for wheezing.  Return to the emergency department if symptoms significantly worsen or change.

## 2017-07-10 ENCOUNTER — Encounter (HOSPITAL_COMMUNITY): Payer: Self-pay

## 2017-07-10 ENCOUNTER — Emergency Department (HOSPITAL_COMMUNITY)
Admission: EM | Admit: 2017-07-10 | Discharge: 2017-07-10 | Disposition: A | Discharge status: Home / Self Care | Payer: Self-pay | Attending: Emergency Medicine | Admitting: Emergency Medicine

## 2017-07-10 ENCOUNTER — Other Ambulatory Visit: Payer: Self-pay

## 2017-07-10 DIAGNOSIS — J4541 Moderate persistent asthma with (acute) exacerbation: Secondary | ICD-10-CM | POA: Insufficient documentation

## 2017-07-10 DIAGNOSIS — Z87891 Personal history of nicotine dependence: Secondary | ICD-10-CM | POA: Insufficient documentation

## 2017-07-10 DIAGNOSIS — J45901 Unspecified asthma with (acute) exacerbation: Secondary | ICD-10-CM

## 2017-07-10 MED ORDER — FLUTICASONE FUROATE-VILANTEROL 200-25 MCG/INH IN AEPB: 1.0000 | INHALATION_SPRAY | Freq: Every day | RESPIRATORY_TRACT | 0 refills | Status: DC

## 2017-07-10 MED ORDER — ALBUTEROL SULFATE HFA 108 (90 BASE) MCG/ACT IN AERS: 1.0000 | INHALATION_SPRAY | Freq: Four times a day (QID) | RESPIRATORY_TRACT | 0 refills | Status: DC | PRN

## 2017-07-10 MED ORDER — ALBUTEROL SULFATE (5 MG/ML) 0.5% IN NEBU: 2.5000 mg | INHALATION_SOLUTION | Freq: Four times a day (QID) | RESPIRATORY_TRACT | 0 refills | Status: DC | PRN

## 2017-07-10 MED ORDER — PREDNISONE 20 MG PO TABS: 40.0000 mg | ORAL_TABLET | Freq: Every day | ORAL | 0 refills | Status: DC

## 2017-07-10 NOTE — ED Provider Notes (Signed)
[facility] DEPT Provider Note   CSN: [id number] Arrival date & time: 07/10/17  1927     History   Chief Complaint Chief Complaint  Patient presents with  . Shortness of Breath    HPI NICKAYLA MCINNIS is a 42 y.o. female who presents with SOB and wheezing. PMH significant for asthma, hx of DVT s/p IVC filter, allergies. The patient's PCP is [facility]. She states that she's had SOB and wheezing every day for weeks. She was seen at [facility] on 6/12 for an asthma exacerbation. She was given multiple breathing tx, steroids, Mg, and an antibiotic at that time. She is still taking the antibiotic but is finished with the steroids. Today she was coughing a lot and wheezing. Tonight she became very short of breath. She kept alternating between her nebulizer and inhaler but felt like her chest was getting tighter so EMS was called. They gave multiple breathing tx and she was not improving. They then started an IV and she was given Mag and Solu-Medrol and was transported. She currently feels a lot better. She has been to multiple different providers for her asthma. She was previously prescribed a Breo inhaler and Dulera but has been off of this for unknown amount of time. She is still taking Allegra and Singulair. She denies fever or URI symptoms. She does have some dental pain over the upper right molar and is waiting for insurance coverage so she can get this taken care of. She states that NSAIDs cause her asthma to flare up.  HPI  Past Medical History:  Diagnosis Date  . Asthma   . DVT (deep venous thrombosis) ([place])   . Fibroid tumor   . PONV (postoperative nausea and vomiting)   . Presence of IVC filter   . Sinusitis   . Tonsillitis     Patient Active Problem List   Diagnosis Date Noted  . Asthma exacerbation 04/28/2016  . Moderate persistent asthma with exacerbation 03/05/2016  . Trimalleolar fracture of right ankle 02/02/2014  . DVT (deep venous thrombosis)  ([place]) 05/01/2013  . Acute DVT (deep venous thrombosis) ([place]) 05/01/2013  . ASTHMA 02/04/2007  . G E REFLUX 02/04/2007    Past Surgical History:  Procedure Laterality Date  . ABDOMINAL HYSTERECTOMY    . BREAST SURGERY    . ORIF ANKLE FRACTURE Right 02/02/2014   Procedure: OPEN REDUCTION INTERNAL FIXATION (ORIF) RIGHT TRIMALLEOLAR ANKLE FRACTURE;  Surgeon: Marianna Payment, MD;  Location: [facility];  Service: Orthopedics;  Laterality: Right;  . TUBAL LIGATION       OB History   None      Home Medications    Prior to Admission medications   Medication Sig Start Date End Date Taking? Authorizing Provider  albuterol (PROVENTIL) (5 MG/ML) 0.5% nebulizer solution Take 0.5 mLs (2.5 mg total) by nebulization every 6 (six) hours as needed for wheezing or shortness of breath. 05/14/17   Hedges, Dellis Filbert, PA-C  amoxicillin-clavulanate (AUGMENTIN) 875-125 MG tablet Take 1 tablet by mouth 2 (two) times daily. One po bid x 7 days 04/16/17   Domenic Moras, PA-C  BREO ELLIPTA 200-25 MCG/INH AEPB USE 1 INHALATION DAILY (WILL NEED APPOINTMENT FOR FURTHER REFILLS) 07/23/15   Brand Males, MD  clindamycin (CLEOCIN T) 1 % external solution Apply 1 application topically 2 (two) times daily.    [provider]  doxycycline (VIBRAMYCIN) 100 MG capsule Take 1 capsule (100 mg total) by mouth 2 (two) times daily. One po bid x 7  days 06/24/17   Veryl Speak, MD  famotidine (PEPCID) 20 MG tablet Take 1 tablet (20 mg total) by mouth 2 (two) times daily as needed for heartburn. 04/29/16   Geradine Girt, DO  fexofenadine (ALLEGRA) 60 MG tablet Take 60 mg by mouth daily.     [provider]  fluticasone (FLONASE) 50 MCG/ACT nasal spray Place 2 sprays into both nostrils daily. 05/14/17   Hedges, Dellis Filbert, PA-C  guaiFENesin (MUCINEX) 600 MG 12 hr tablet Take 2 tablets (1,200 mg total) by mouth 2 (two) times daily. 04/29/16   Geradine Girt, DO  magic mouthwash w/lidocaine SOLN Take 5 mLs by mouth 4 (four)  times daily as needed for mouth pain. 02/10/17   Charlesetta Shanks, MD  mometasone-formoterol (DULERA) 100-5 MCG/ACT AERO Inhale 2 puffs into the lungs 2 (two) times daily.    [provider]  montelukast (SINGULAIR) 10 MG tablet Take 1 tablet (10 mg total) by mouth daily. 04/29/16   Geradine Girt, DO  oxymetazoline (AFRIN) 0.05 % nasal spray Place 1 spray into both nostrils 2 (two) times daily. Use for 3 days 04/29/16   Geradine Girt, DO  predniSONE (DELTASONE) 10 MG tablet Take 2 tablets (20 mg total) by mouth 2 (two) times daily with a meal. 06/24/17   Veryl Speak, MD  sodium chloride (OCEAN) 0.65 % SOLN nasal spray Place 1 spray into both nostrils as needed for congestion. 04/29/16   Geradine Girt, DO    Family History Family History  Problem Relation Age of Onset  . Congestive Heart Failure Mother   . Diabetes Mother   . Asthma Mother   . Hypertension Mother   . Asthma Father   . Sleep apnea Father   . Hypercholesterolemia Father   . Heart murmur Sister   . Heart disease Brother   . Cancer Other   . Congestive Heart Failure Other     Social History Social History   Tobacco Use  . Smoking status: Former Smoker    Packs/day: 1.00    Years: 8.00    Pack years: 8.00    Types: Cigarettes    Last attempt to quit: 07/14/2014    Years since quitting: 2.9  . Smokeless tobacco: Never Used  Substance Use Topics  . Alcohol use: Yes    Alcohol/week: 0.0 oz    Comment: rare  . Drug use: No     Allergies   Biaxin [clarithromycin]; Ibuprofen; Codeine; and Naproxen   Review of Systems Review of Systems  Constitutional: Negative for fever.  HENT: Positive for dental problem. Negative for congestion.   Respiratory: Positive for cough, chest tightness, shortness of breath and wheezing.   Cardiovascular: Negative for chest pain and leg swelling.  All other systems reviewed and are negative.    Physical Exam Updated Vital Signs BP 134/87 (BP Location: Left Arm)    Pulse (!) 121   Resp 20   LMP 03/15/2013   SpO2 96%   Physical Exam  Constitutional: She is oriented to person, place, and time. She appears well-developed and well-nourished. No distress.  Calm and cooperative. No distress  HENT:  Head: Normocephalic and atraumatic.  Broken right upper molar. No obvious infection  Eyes: Pupils are equal, round, and reactive to light. Conjunctivae are normal. Right eye exhibits no discharge. Left eye exhibits no discharge. No scleral icterus.  Neck: Normal range of motion.  Cardiovascular: Tachycardia present. Exam reveals no gallop and no friction rub.  No murmur heard.  Pulmonary/Chest: Effort normal. No respiratory distress. She has wheezes (mild expiratory wheezes in all lung fields).  Abdominal: She exhibits no distension.  Neurological: She is alert and oriented to person, place, and time.  Skin: Skin is warm and dry.  Psychiatric: She has a normal mood and affect. Her behavior is normal.  Nursing note and vitals reviewed.    ED Treatments / Results  Labs (all labs ordered are listed, but only abnormal results are displayed) Labs Reviewed - No data to display  EKG None  Radiology No results found.  Procedures Procedures (including critical care time)  Medications Ordered in ED Medications - No data to display   Initial Impression / Assessment and Plan / ED Course  I have reviewed the triage vital signs and the nursing notes.  Pertinent labs & imaging results that were available during my care of the patient were reviewed by me and considered in my medical decision making (see chart for details).  42 year old female with frequent asthma exacerbations presents with cough, wheezing, shortness of breath consistent with prior asthma exacerbations.  She is tachycardic on arrival otherwise vital signs are normal.  This is likely due to the multiple breathing treatments that she had prior to arrival.  Heart rate is improved on recheck.   When I went into the room the patient is requesting discharge.  She feels back to her baseline.  Discussed with the patient at length that she needs to follow-up with her primary doctor regarding the control of her asthma.  She is agreeable to ambulation in the ED.  Ambulatory sats are normal.  She still feels improved.  Discussed refill of inhaler, nebulizer solution, Brio inhaler, and steroid burst.  Recommended primary care follow-up.  Final Clinical Impressions(s) / ED Diagnoses   Final diagnoses:  Moderate asthma with exacerbation, unspecified whether persistent    ED Discharge Orders    None       Recardo Evangelist, PA-C 07/10/17 2355    Malvin Johns, MD 07/10/17 [phone]

## 2017-07-10 NOTE — ED Notes (Signed)
Bed: ZD63 Expected date:  Expected time:  Means of arrival:  Comments: EMS 42 yo female from home/asthma-albuterol 5 mg duoneb/solumedrol IV-ST 130 185/92

## 2017-07-10 NOTE — Discharge Instructions (Signed)
Use albuterol inhaler or nebulizer as needed for shortness of breath/wheezing Use Breo inhaler once daily. Please follow up with your doctor regarding control of your asthma Take Prednisone for the next couple days Return if worsening

## 2017-07-10 NOTE — ED Notes (Signed)
Pt maintained O2 sat of 96-100% room air while ambulating

## 2017-07-10 NOTE — ED Triage Notes (Signed)
Pt reports increased shortness of breath and chest tightness today. Hx asthma. Taken breathing tx and rescue inhaler with no relief. 15mg  albuterol total given +  1mg  atrovent, 125mg  solumedrol + 2g mag sulfate given pre hospital, was recently dx with bronchitis. Bilat wheezing. Reports also left tooth pain.

## 2017-07-13 ENCOUNTER — Emergency Department (HOSPITAL_COMMUNITY): Payer: Self-pay

## 2017-07-13 ENCOUNTER — Other Ambulatory Visit: Payer: Self-pay

## 2017-07-13 ENCOUNTER — Encounter (HOSPITAL_COMMUNITY): Payer: Self-pay

## 2017-07-13 ENCOUNTER — Inpatient Hospital Stay (HOSPITAL_COMMUNITY)
Admission: EM | Admit: 2017-07-13 | Discharge: 2017-07-15 | DRG: 202 | Disposition: A | Discharge status: Home / Self Care | Payer: Self-pay | Attending: Internal Medicine | Admitting: Internal Medicine

## 2017-07-13 DIAGNOSIS — Z886 Allergy status to analgesic agent status: Secondary | ICD-10-CM

## 2017-07-13 DIAGNOSIS — J4552 Severe persistent asthma with status asthmaticus: Principal | ICD-10-CM | POA: Diagnosis present

## 2017-07-13 DIAGNOSIS — J9601 Acute respiratory failure with hypoxia: Secondary | ICD-10-CM | POA: Diagnosis present

## 2017-07-13 DIAGNOSIS — Z87891 Personal history of nicotine dependence: Secondary | ICD-10-CM

## 2017-07-13 DIAGNOSIS — J9602 Acute respiratory failure with hypercapnia: Secondary | ICD-10-CM | POA: Diagnosis present

## 2017-07-13 DIAGNOSIS — Z95828 Presence of other vascular implants and grafts: Secondary | ICD-10-CM

## 2017-07-13 DIAGNOSIS — Z599 Problem related to housing and economic circumstances, unspecified: Secondary | ICD-10-CM

## 2017-07-13 DIAGNOSIS — Z7951 Long term (current) use of inhaled steroids: Secondary | ICD-10-CM

## 2017-07-13 DIAGNOSIS — Z79899 Other long term (current) drug therapy: Secondary | ICD-10-CM

## 2017-07-13 DIAGNOSIS — Z888 Allergy status to other drugs, medicaments and biological substances status: Secondary | ICD-10-CM

## 2017-07-13 DIAGNOSIS — J45901 Unspecified asthma with (acute) exacerbation: Secondary | ICD-10-CM | POA: Diagnosis present

## 2017-07-13 DIAGNOSIS — K0889 Other specified disorders of teeth and supporting structures: Secondary | ICD-10-CM | POA: Diagnosis present

## 2017-07-13 DIAGNOSIS — Z86718 Personal history of other venous thrombosis and embolism: Secondary | ICD-10-CM

## 2017-07-13 DIAGNOSIS — Z885 Allergy status to narcotic agent status: Secondary | ICD-10-CM

## 2017-07-13 DIAGNOSIS — Z880 Allergy status to penicillin: Secondary | ICD-10-CM

## 2017-07-13 LAB — URINALYSIS, ROUTINE W REFLEX MICROSCOPIC
Bilirubin Urine: NEGATIVE
GLUCOSE, UA: NEGATIVE mg/dL
Ketones, ur: NEGATIVE mg/dL
LEUKOCYTES UA: NEGATIVE
NITRITE: NEGATIVE
Protein, ur: NEGATIVE mg/dL
SPECIFIC GRAVITY, URINE: 1.03 (ref 1.005–1.030)
pH: 5 (ref 5.0–8.0)

## 2017-07-13 LAB — CBC WITH DIFFERENTIAL/PLATELET
ABS IMMATURE GRANULOCYTES: 0 10*3/uL (ref 0.0–0.1)
Basophils Absolute: 0 10*3/uL (ref 0.0–0.1)
Basophils Relative: 0 %
EOS ABS: 0 10*3/uL (ref 0.0–0.7)
Eosinophils Relative: 0 %
HEMATOCRIT: 42.7 % (ref 36.0–46.0)
HEMOGLOBIN: 13.9 g/dL (ref 12.0–15.0)
Immature Granulocytes: 0 %
LYMPHS ABS: 5.8 10*3/uL — AB (ref 0.7–4.0)
Lymphocytes Relative: 55 %
MCH: 30.5 pg (ref 26.0–34.0)
MCHC: 32.6 g/dL (ref 30.0–36.0)
MCV: 93.6 fL (ref 78.0–100.0)
Monocytes Absolute: 1 10*3/uL (ref 0.1–1.0)
Monocytes Relative: 10 %
NEUTROS ABS: 3.8 10*3/uL (ref 1.7–7.7)
NEUTROS PCT: 35 %
Platelets: 334 10*3/uL (ref 150–400)
RBC: 4.56 MIL/uL (ref 3.87–5.11)
RDW: 14.2 % (ref 11.5–15.5)
WBC: 10.7 10*3/uL — AB (ref 4.0–10.5)

## 2017-07-13 LAB — COMPREHENSIVE METABOLIC PANEL
ALT: 27 U/L (ref 0–44)
ANION GAP: 9 (ref 5–15)
AST: 20 U/L (ref 15–41)
Albumin: 4.1 g/dL (ref 3.5–5.0)
Alkaline Phosphatase: 67 U/L (ref 38–126)
BUN: 14 mg/dL (ref 6–20)
CO2: 25 mmol/L (ref 22–32)
CREATININE: 0.84 mg/dL (ref 0.44–1.00)
Calcium: 9 mg/dL (ref 8.9–10.3)
Chloride: 106 mmol/L (ref 98–111)
GFR calc non Af Amer: >60 mL/min (ref >60)
Glucose, Bld: 105 mg/dL — ABNORMAL HIGH (ref 70–99)
POTASSIUM: 3.5 mmol/L (ref 3.5–5.1)
SODIUM: 140 mmol/L (ref 135–145)
Total Bilirubin: 0.7 mg/dL (ref 0.3–1.2)
Total Protein: 7.8 g/dL (ref 6.5–8.1)

## 2017-07-13 LAB — PHOSPHORUS: PHOSPHORUS: 5.1 mg/dL — AB (ref 2.5–4.6)

## 2017-07-13 LAB — I-STAT ARTERIAL BLOOD GAS, ED
Acid-Base Excess: 1 mmol/L (ref 0.0–2.0)
Bicarbonate: 27.7 mmol/L (ref 20.0–28.0)
O2 Saturation: 100 %
PH ART: 7.363 (ref 7.350–7.450)
TCO2: 29 mmol/L (ref 22–32)
pCO2 arterial: 48.4 mmHg — ABNORMAL HIGH (ref 32.0–48.0)
pO2, Arterial: 442 mmHg — ABNORMAL HIGH (ref 83.0–108.0)

## 2017-07-13 LAB — I-STAT BETA HCG BLOOD, ED (MC, WL, AP ONLY)

## 2017-07-13 LAB — I-STAT TROPONIN, ED: Troponin i, poc: 0 ng/mL (ref 0.00–0.08)

## 2017-07-13 LAB — MAGNESIUM: Magnesium: 2.7 mg/dL — ABNORMAL HIGH (ref 1.7–2.4)

## 2017-07-13 LAB — I-STAT CG4 LACTIC ACID, ED: Lactic Acid, Venous: 0.83 mmol/L (ref 0.5–1.9)

## 2017-07-13 MED ORDER — ALBUTEROL (5 MG/ML) CONTINUOUS INHALATION SOLN
10.0000 mg/h | INHALATION_SOLUTION | Freq: Once | RESPIRATORY_TRACT | Status: AC
  Administered 2017-07-13: 10 mg/h via RESPIRATORY_TRACT
  Filled 2017-07-13: qty 20

## 2017-07-13 MED ORDER — SODIUM CHLORIDE 0.9 % IV SOLN
Freq: Once | INTRAVENOUS | Status: AC
  Administered 2017-07-13: 23:00:00 via INTRAVENOUS

## 2017-07-13 NOTE — ED Triage Notes (Signed)
Per [facility], pt arrives from home w complaints of respiratory distress. Pt was 80 % on RA, placed on CPAP and stayed at 98%. Pt has hx of ashtma and was seen at [facility] 2 days ago for the same. Pt received 2 duonebs and 1 albuterol tx, 0.3 of epi, 125 of solu-medrol, and 2 g of mag prior to arrival. EMS reported wheezing in upper lobes.

## 2017-07-14 ENCOUNTER — Encounter (HOSPITAL_COMMUNITY): Payer: Self-pay | Admitting: Internal Medicine

## 2017-07-14 ENCOUNTER — Other Ambulatory Visit: Payer: Self-pay

## 2017-07-14 LAB — CBC
HEMATOCRIT: 39.3 % (ref 36.0–46.0)
HEMOGLOBIN: 12.7 g/dL (ref 12.0–15.0)
MCH: 30.5 pg (ref 26.0–34.0)
MCHC: 32.3 g/dL (ref 30.0–36.0)
MCV: 94.2 fL (ref 78.0–100.0)
Platelets: 310 10*3/uL (ref 150–400)
RBC: 4.17 MIL/uL (ref 3.87–5.11)
RDW: 14.4 % (ref 11.5–15.5)
WBC: 11.7 10*3/uL — ABNORMAL HIGH (ref 4.0–10.5)

## 2017-07-14 LAB — BASIC METABOLIC PANEL
ANION GAP: 12 (ref 5–15)
BUN: 15 mg/dL (ref 6–20)
CHLORIDE: 105 mmol/L (ref 98–111)
CO2: 23 mmol/L (ref 22–32)
Calcium: 9 mg/dL (ref 8.9–10.3)
Creatinine, Ser: 0.94 mg/dL (ref 0.44–1.00)
GFR calc Af Amer: >60 mL/min (ref >60)
GFR calc non Af Amer: >60 mL/min (ref >60)
GLUCOSE: 140 mg/dL — AB (ref 70–99)
POTASSIUM: 3.5 mmol/L (ref 3.5–5.1)
Sodium: 140 mmol/L (ref 135–145)

## 2017-07-14 LAB — GLUCOSE, CAPILLARY: Glucose-Capillary: 125 mg/dL — ABNORMAL HIGH (ref 70–99)

## 2017-07-14 LAB — HIV ANTIBODY (ROUTINE TESTING W REFLEX): HIV Screen 4th Generation wRfx: NONREACTIVE

## 2017-07-14 MED ORDER — ACETAMINOPHEN 500 MG PO TABS
500.0000 mg | ORAL_TABLET | Freq: Four times a day (QID) | ORAL | Status: DC | PRN
  Administered 2017-07-14: 500 mg via ORAL
  Administered 2017-07-14 – 2017-07-15 (×3): 1000 mg via ORAL
  Filled 2017-07-14: qty 2
  Filled 2017-07-14: qty 1
  Filled 2017-07-14: qty 2
  Filled 2017-07-14 (×2): qty 1

## 2017-07-14 MED ORDER — METHYLPREDNISOLONE SODIUM SUCC 40 MG IJ SOLR
40.0000 mg | Freq: Two times a day (BID) | INTRAMUSCULAR | Status: DC
  Administered 2017-07-14 – 2017-07-15 (×3): 40 mg via INTRAVENOUS
  Filled 2017-07-14 (×3): qty 1

## 2017-07-14 MED ORDER — ACETAMINOPHEN 325 MG PO TABS
650.0000 mg | ORAL_TABLET | Freq: Four times a day (QID) | ORAL | Status: DC | PRN
  Administered 2017-07-14: 650 mg via ORAL
  Filled 2017-07-14: qty 2

## 2017-07-14 MED ORDER — MONTELUKAST SODIUM 10 MG PO TABS
10.0000 mg | ORAL_TABLET | Freq: Every day | ORAL | Status: DC
  Administered 2017-07-14 – 2017-07-15 (×2): 10 mg via ORAL
  Filled 2017-07-14 (×2): qty 1

## 2017-07-14 MED ORDER — FLUTICASONE FUROATE-VILANTEROL 200-25 MCG/INH IN AEPB
1.0000 | INHALATION_SPRAY | Freq: Every day | RESPIRATORY_TRACT | Status: DC
  Administered 2017-07-15: 1 via RESPIRATORY_TRACT
  Filled 2017-07-14: qty 28

## 2017-07-14 MED ORDER — ALBUTEROL SULFATE (2.5 MG/3ML) 0.083% IN NEBU
2.5000 mg | INHALATION_SOLUTION | RESPIRATORY_TRACT | Status: DC | PRN
  Administered 2017-07-15: 2.5 mg via RESPIRATORY_TRACT
  Filled 2017-07-14: qty 3

## 2017-07-14 MED ORDER — LIDOCAINE VISCOUS HCL 2 % MT SOLN
15.0000 mL | Freq: Four times a day (QID) | OROMUCOSAL | Status: DC | PRN
  Administered 2017-07-14: 15 mL via OROMUCOSAL
  Filled 2017-07-14 (×3): qty 15

## 2017-07-14 MED ORDER — ALBUTEROL SULFATE (2.5 MG/3ML) 0.083% IN NEBU
2.5000 mg | INHALATION_SOLUTION | Freq: Four times a day (QID) | RESPIRATORY_TRACT | Status: DC
  Administered 2017-07-15: 2.5 mg via RESPIRATORY_TRACT
  Filled 2017-07-14 (×2): qty 3

## 2017-07-14 MED ORDER — ONDANSETRON HCL 4 MG PO TABS: 4.0000 mg | ORAL_TABLET | Freq: Four times a day (QID) | ORAL | Status: DC | PRN

## 2017-07-14 MED ORDER — ACETAMINOPHEN 650 MG RE SUPP: 650.0000 mg | Freq: Four times a day (QID) | RECTAL | Status: DC | PRN

## 2017-07-14 MED ORDER — ONDANSETRON HCL 4 MG/2ML IJ SOLN: 4.0000 mg | Freq: Four times a day (QID) | INTRAMUSCULAR | Status: DC | PRN

## 2017-07-14 MED ORDER — ENOXAPARIN SODIUM 40 MG/0.4ML ~~LOC~~ SOLN
40.0000 mg | SUBCUTANEOUS | Status: DC
  Administered 2017-07-14 – 2017-07-15 (×2): 40 mg via SUBCUTANEOUS
  Filled 2017-07-14 (×2): qty 0.4

## 2017-07-14 MED ORDER — ALBUTEROL SULFATE (2.5 MG/3ML) 0.083% IN NEBU
2.5000 mg | INHALATION_SOLUTION | RESPIRATORY_TRACT | Status: DC
  Administered 2017-07-14 (×5): 2.5 mg via RESPIRATORY_TRACT
  Filled 2017-07-14 (×5): qty 3

## 2017-07-14 NOTE — H&P (Signed)
History and Physical    Linda Dalton:[id number] DOB: 29-Jan-1975 DOA: 07/13/2017  PCP: Patient, No Pcp Per  Patient coming from: Home.  Chief Complaint: Shortness of breath.  HPI: Linda Dalton is a 42 y.o. female with history of asthma prior history of DVT status post IVC filter placement started experiencing severe shortness of breath 2 to 3 hours prior to arrival to the ER.  Denies any chest pain has been a nonproductive cough.  Patient had asthma attack 2 to 3 days ago and had come to the ER and was prescribed Breo singular and steroids.  Which patient has not taken due to financial issues.  3 weeks ago also patient on the way back from [place] had to stop on route and had to go to the ER because of asthma attack.  ED Course: In the ER patient was found to be wheezing hypoxic and to be placed on BiPAP.  ABG shows PCO2 of 48.  Patient was given nebulizer treatment steroids following which breathing improved and BiPAP was taken off.  On my exam patient's shortness of breath improved and has been admitted for further management.  Review of Systems: As per HPI, rest all negative.   Past Medical History:  Diagnosis Date  . Asthma   . DVT (deep venous thrombosis) ([place])   . Fibroid tumor   . PONV (postoperative nausea and vomiting)   . Presence of IVC filter   . Sinusitis   . Tonsillitis     Past Surgical History:  Procedure Laterality Date  . ABDOMINAL HYSTERECTOMY    . BREAST SURGERY    . ORIF ANKLE FRACTURE Right 02/02/2014   Procedure: OPEN REDUCTION INTERNAL FIXATION (ORIF) RIGHT TRIMALLEOLAR ANKLE FRACTURE;  Surgeon: Marianna Payment, MD;  Location: [facility];  Service: Orthopedics;  Laterality: Right;  . TUBAL LIGATION       reports that she quit smoking about 3 years ago. Her smoking use included cigarettes. She has a 8.00 pack-year smoking history. She has never used smokeless tobacco. She reports that she drinks alcohol. She reports that she does not use  drugs.  Allergies  Allergen Reactions  . Clarithromycin Anaphylaxis, Hives, Shortness Of Breath, Swelling and Other (See Comments)    Mouth swells  . Codeine Hives, Rash and Swelling  . Ibuprofen Anaphylaxis and Shortness Of Breath  . Naproxen Anaphylaxis and Nausea And Vomiting  . Nsaids Anaphylaxis  . Penicillins Swelling and Rash    Has patient had a PCN reaction causing immediate rash, facial/tongue/throat swelling, SOB or lightheadedness with hypotension: Yes Has patient had a PCN reaction causing severe rash involving mucus membranes or skin necrosis: Unk Has patient had a PCN reaction that required hospitalization: Yes Has patient had a PCN reaction occurring within the last 10 years: Yes If all of the above answers are "NO", then may proceed with Cephalosporin use.     Family History  Problem Relation Age of Onset  . Congestive Heart Failure Mother   . Diabetes Mother   . Asthma Mother   . Hypertension Mother   . Asthma Father   . Sleep apnea Father   . Hypercholesterolemia Father   . Heart murmur Sister   . Heart disease Brother   . Cancer Other   . Congestive Heart Failure Other     Prior to Admission medications   Medication Sig Start Date End Date Taking? Authorizing Provider  acetaminophen (TYLENOL) 500 MG tablet Take 500-1,000 mg by mouth every 6 (  six) hours as needed (for pain or headaches).   Yes [provider]  albuterol (PROVENTIL) (5 MG/ML) 0.5% nebulizer solution Take 0.5 mLs (2.5 mg total) by nebulization every 6 (six) hours as needed for wheezing or shortness of breath. 07/10/17  Yes Recardo Evangelist, PA-C  Cholecalciferol (VITAMIN D3) 400 units CAPS Take 400 Units by mouth daily.   Yes [provider]  fexofenadine (ALLEGRA) 180 MG tablet Take 180 mg by mouth daily.   Yes [provider]  fluticasone (FLONASE) 50 MCG/ACT nasal spray Place 2 sprays into both nostrils daily. 05/14/17  Yes Hedges, Dellis Filbert, PA-C  montelukast  (SINGULAIR) 10 MG tablet Take 1 tablet (10 mg total) by mouth daily. 04/29/16  Yes Geradine Girt, DO  Multiple Vitamins-Calcium (ONE-A-DAY WOMENS PO) Take 1 tablet by mouth daily.   Yes [provider]  oxymetazoline (AFRIN) 0.05 % nasal spray Place 1 spray into both nostrils 2 (two) times daily. Use for 3 days 04/29/16  Yes Eulogio Bear U, DO  predniSONE (DELTASONE) 20 MG tablet Take 2 tablets (40 mg total) by mouth daily. 07/10/17  Yes Recardo Evangelist, PA-C  pseudoephedrine-guaifenesin (MUCINEX D) 60-600 MG 12 hr tablet Take 1 tablet by mouth every 12 (twelve) hours as needed for congestion.   Yes [provider]  albuterol (PROVENTIL HFA;VENTOLIN HFA) 108 (90 Base) MCG/ACT inhaler Inhale 1-2 puffs into the lungs every 6 (six) hours as needed for wheezing or shortness of breath. Patient not taking: Reported on 07/13/2017 07/10/17   Recardo Evangelist, PA-C  BREO ELLIPTA 200-25 MCG/INH AEPB USE 1 INHALATION DAILY (WILL NEED APPOINTMENT FOR FURTHER REFILLS) Patient not taking: Reported on 07/14/2017 07/23/15   Brand Males, MD  doxycycline (VIBRAMYCIN) 100 MG capsule Take 1 capsule (100 mg total) by mouth 2 (two) times daily. One po bid x 7 days Patient not taking: Reported on 07/13/2017 06/24/17   Veryl Speak, MD  famotidine (PEPCID) 20 MG tablet Take 1 tablet (20 mg total) by mouth 2 (two) times daily as needed for heartburn. Patient not taking: Reported on 07/13/2017 04/29/16   Geradine Girt, DO  fluticasone furoate-vilanterol (BREO ELLIPTA) 200-25 MCG/INH AEPB Inhale 1 puff into the lungs daily. 07/10/17   Recardo Evangelist, PA-C  guaiFENesin (MUCINEX) 600 MG 12 hr tablet Take 2 tablets (1,200 mg total) by mouth 2 (two) times daily. Patient not taking: Reported on 07/14/2017 04/29/16   Geradine Girt, DO  magic mouthwash w/lidocaine SOLN Take 5 mLs by mouth 4 (four) times daily as needed for mouth pain. Patient not taking: Reported on 07/14/2017 02/10/17   Charlesetta Shanks, MD   sodium chloride (OCEAN) 0.65 % SOLN nasal spray Place 1 spray into both nostrils as needed for congestion. Patient not taking: Reported on 07/14/2017 04/29/16   Geradine Girt, DO    Physical Exam: Vitals:   07/14/17 0100 07/14/17 0115 07/14/17 0130 07/14/17 0206  BP: 110/63 97/71 106/60 104/76  Pulse: (!) 112 (!) 107 97 (!) 102  Resp: 15 (!) 26 (!) 23 20  Temp:      TempSrc:      SpO2: 100% 100% 99% 99%      Constitutional: Moderately built and nourished. Vitals:   07/14/17 0100 07/14/17 0115 07/14/17 0130 07/14/17 0206  BP: 110/63 97/71 106/60 104/76  Pulse: (!) 112 (!) 107 97 (!) 102  Resp: 15 (!) 26 (!) 23 20  Temp:      TempSrc:      SpO2: 100%  100% 99% 99%   Eyes: Anicteric no pallor. ENMT: No discharge from the ears eyes nose or mouth. Neck: No mass felt.  No JVD appreciated. Respiratory: Bilateral expiratory wheeze and no crepitations. Cardiovascular: S1-S2 heard no murmurs appreciated. Abdomen: Soft nontender bowel sounds present. Musculoskeletal: No edema.  No joint effusion. Skin: No rash. Neurologic: Alert awake oriented to time place and person.  Moves all extremities. Psychiatric: Appears normal per normal affect.   Labs on Admission: I have personally reviewed following labs and imaging studies  CBC: Recent Labs  Lab 07/13/17 2232  WBC 10.7*  NEUTROABS 3.8  HGB 13.9  HCT 42.7  MCV 93.6  PLT 242   Basic Metabolic Panel: Recent Labs  Lab 07/13/17 2232  NA 140  K 3.5  CL 106  CO2 25  GLUCOSE 105*  BUN 14  CREATININE 0.84  CALCIUM 9.0  MG 2.7*  PHOS 5.1*   GFR: Estimated Creatinine Clearance: 106.4 mL/min (by C-G formula based on SCr of 0.84 mg/dL). Liver Function Tests: Recent Labs  Lab 07/13/17 2232  AST 20  ALT 27  ALKPHOS 67  BILITOT 0.7  PROT 7.8  ALBUMIN 4.1   No results for input(s): LIPASE, AMYLASE in the last 168 hours. No results for input(s): AMMONIA in the last 168 hours. Coagulation Profile: No results for  input(s): INR, PROTIME in the last 168 hours. Cardiac Enzymes: No results for input(s): CKTOTAL, CKMB, CKMBINDEX, TROPONINI in the last 168 hours. BNP (last 3 results) No results for input(s): PROBNP in the last 8760 hours. HbA1C: No results for input(s): HGBA1C in the last 72 hours. CBG: Recent Labs  Lab 07/14/17 0213  GLUCAP 125*   Lipid Profile: No results for input(s): CHOL, HDL, LDLCALC, TRIG, CHOLHDL, LDLDIRECT in the last 72 hours. Thyroid Function Tests: No results for input(s): TSH, T4TOTAL, FREET4, T3FREE, THYROIDAB in the last 72 hours. Anemia Panel: No results for input(s): VITAMINB12, FOLATE, FERRITIN, TIBC, IRON, RETICCTPCT in the last 72 hours. Urine analysis:    Component Value Date/Time   COLORURINE YELLOW 07/13/2017 2301   APPEARANCEUR CLOUDY (A) 07/13/2017 2301   LABSPEC 1.030 07/13/2017 2301   PHURINE 5.0 07/13/2017 2301   GLUCOSEU NEGATIVE 07/13/2017 2301   HGBUR MODERATE (A) 07/13/2017 2301   BILIRUBINUR NEGATIVE 07/13/2017 2301   KETONESUR NEGATIVE 07/13/2017 2301   PROTEINUR NEGATIVE 07/13/2017 2301   UROBILINOGEN 0.2 11/02/2013 1030   NITRITE NEGATIVE 07/13/2017 2301   LEUKOCYTESUR NEGATIVE 07/13/2017 2301   Sepsis Labs: @LABRCNTIP (procalcitonin:4,lacticidven:4) )No results found for this or any previous visit (from the past 240 hour(s)).   Radiological Exams on Admission: Dg Chest Port 1 View  Result Date: 07/13/2017 CLINICAL DATA:  Shortness of breath EXAM: PORTABLE CHEST 1 VIEW COMPARISON:  06/24/2017 FINDINGS: The heart size and mediastinal contours are within normal limits. Both lungs are clear. The visualized skeletal structures are unremarkable. IMPRESSION: No active disease. Electronically Signed   By: Ulyses Jarred M.D.   On: 07/13/2017 22:45    EKG: Independently reviewed.  Sinus tachycardia.  Assessment/Plan Principal Problem:   Acute respiratory failure with hypoxia and hypercapnia (HCC) Active Problems:   Asthma exacerbation     1. Acute respiratory failure with hypoxia and hypercarbia secondary to severe asthma exacerbation -patient has improved so BiPAP was weaned off.  Able to complete sentences without difficulty.  Continue to wheeze.  Will keep patient on Solu-Medrol nebulizer and continue on Breo and Singulair.   2. History of DVT status post IVC filter placement.  DVT prophylaxis: Lovenox. Code Status: Full code. Family Communication: Discussed with patient. Disposition Plan: Home. Consults called: None. Admission status: Inpatient.   Rise Patience MD [facility] Pager [phone].  If 7PM-7AM, please contact night-coverage [email or web address] Password [email or web address]  07/14/2017, 2:14 AM

## 2017-07-14 NOTE — Plan of Care (Signed)
Plan of care discussed with patient.  Patient struggles with controlling her asthma because she has many triggers.  Encouraged patient to identify and avoid as many triggers as possible.  Patient displayed good teach back.

## 2017-07-14 NOTE — Progress Notes (Signed)
Patient is admitted early this morning due to asthma exacerbation.  She is feeling significant better this afternoon, room air at rest, lung exam has much improved.  She reported recurrent asthma exacerbations recently. She likely will benefit from a longer course of steroid taper (7-10days).  Need to follow with pulmonology. She expressed understanding.  Dental pain, viscous lidocaine provided.  Appointment with dentist on Friday.

## 2017-07-14 NOTE — ED Notes (Signed)
RN attempted to give Report, RN unavailable 

## 2017-07-15 DIAGNOSIS — J4541 Moderate persistent asthma with (acute) exacerbation: Secondary | ICD-10-CM

## 2017-07-15 DIAGNOSIS — J9601 Acute respiratory failure with hypoxia: Secondary | ICD-10-CM

## 2017-07-15 DIAGNOSIS — J9602 Acute respiratory failure with hypercapnia: Secondary | ICD-10-CM

## 2017-07-15 LAB — BASIC METABOLIC PANEL
Anion gap: 6 (ref 5–15)
BUN: 17 mg/dL (ref 6–20)
CO2: 24 mmol/L (ref 22–32)
Calcium: 8.8 mg/dL — ABNORMAL LOW (ref 8.9–10.3)
Chloride: 108 mmol/L (ref 98–111)
Creatinine, Ser: 0.75 mg/dL (ref 0.44–1.00)
GFR calc Af Amer: >60 mL/min (ref >60)
Glucose, Bld: 121 mg/dL — ABNORMAL HIGH (ref 70–99)
POTASSIUM: 4.1 mmol/L (ref 3.5–5.1)
SODIUM: 138 mmol/L (ref 135–145)

## 2017-07-15 LAB — CBC
HEMATOCRIT: 39 % (ref 36.0–46.0)
HEMOGLOBIN: 12.6 g/dL (ref 12.0–15.0)
MCH: 30.2 pg (ref 26.0–34.0)
MCHC: 32.3 g/dL (ref 30.0–36.0)
MCV: 93.5 fL (ref 78.0–100.0)
Platelets: 311 10*3/uL (ref 150–400)
RBC: 4.17 MIL/uL (ref 3.87–5.11)
RDW: 14.4 % (ref 11.5–15.5)
WBC: 11 10*3/uL — ABNORMAL HIGH (ref 4.0–10.5)

## 2017-07-15 LAB — LACTIC ACID, PLASMA: LACTIC ACID, VENOUS: 1.6 mmol/L (ref 0.5–1.9)

## 2017-07-15 MED ORDER — PREDNISONE 10 MG PO TABS: ORAL_TABLET | ORAL | 0 refills | Status: DC

## 2017-07-15 MED ORDER — FLUTICASONE FUROATE-VILANTEROL 200-25 MCG/INH IN AEPB: 1.0000 | INHALATION_SPRAY | Freq: Every day | RESPIRATORY_TRACT | 3 refills | Status: DC

## 2017-07-15 NOTE — Discharge Summary (Signed)
Linda Dalton, is a 42 y.o. female  DOB 08/14/1975  MRN [id number].  Admission date:  07/13/2017  Admitting Physician  Rise Patience, MD  Discharge Date:  07/15/2017   Primary MD  Patient, No Pcp Per  Recommendations for primary care physician for things to follow:  - please check CBC, BMP during next visit.   Admission Diagnosis  Asthma exacerbation [J45.901]   Discharge Diagnosis  Asthma exacerbation [J45.901]   Principal Problem:   Acute respiratory failure with hypoxia and hypercapnia (HCC) Active Problems:   Asthma exacerbation      Past Medical History:  Diagnosis Date  . Asthma   . DVT (deep venous thrombosis) ([place])   . Fibroid tumor   . PONV (postoperative nausea and vomiting)   . Presence of IVC filter   . Sinusitis   . Tonsillitis     Past Surgical History:  Procedure Laterality Date  . ABDOMINAL HYSTERECTOMY    . BREAST SURGERY    . ORIF ANKLE FRACTURE Right 02/02/2014   Procedure: OPEN REDUCTION INTERNAL FIXATION (ORIF) RIGHT TRIMALLEOLAR ANKLE FRACTURE;  Surgeon: Marianna Payment, MD;  Location: [facility];  Service: Orthopedics;  Laterality: Right;  . TUBAL LIGATION         History of present illness and  Hospital Course:     Kindly see H&P for history of present illness and admission details, please review complete Labs, Consult reports and Test reports for all details in brief  HPI  from the history and physical done on the day of admission 07/14/17   HPI: Linda Dalton is a 42 y.o. female with history of asthma prior history of DVT status post IVC filter placement started experiencing severe shortness of breath 2 to 3 hours prior to arrival to the ER.  Denies any chest pain has been a nonproductive cough.  Patient had asthma attack 2 to 3 days ago and had come to the ER and was prescribed Breo singular and steroids.  Which patient has not taken due to  financial issues.  3 weeks ago also patient on the way back from [place] had to stop on route and had to go to the ER because of asthma attack.  ED Course: In the ER patient was found to be wheezing hypoxic and to be placed on BiPAP.  ABG shows PCO2 of 48.  Patient was given nebulizer treatment steroids following which breathing improved and BiPAP was taken off.  On my exam patient's shortness of breath improved and has been admitted for further management.    Hospital Course   Acute respiratory failure with hypoxia and hypercarbia secondary to severe asthma exacerbation - - resp failure has resolved, she is currently on room air, required BiPAP initially, her asthma exacerbation significantly improved off on IV Solu-Medrol, she will be discharged on oral prednisone taper, instructed to continue taking her Spiriva, Brio Ellipta , already has refills, I have instructed her to go to [facility] pharmacy, as she will be provided with  samples until she filled her medication assistance program.  History of DVT status post IVC filter placement. -Denies any leg swelling.      Discharge Condition: stable    Follow UP  Follow-up Information    [facility] Follow up on 08/11/2017.   Why:  1:30 for hospital follow up apt. Contact information: [place] [address] [place] [facility].   Why:  you can use the pharmacy here for medication assistance Contact information: [address] [place] [address] [phone]            Discharge Instructions  and  Discharge Medications     Discharge Instructions    Discharge instructions   Complete by:  As directed    Follow with Primary MD  in 7 days   Get CBC, CMP, checked  by Primary MD next visit.    Activity: As tolerated with Full fall precautions use walker/cane &  assistance as needed   Disposition Home    Diet: regular diet  For Heart failure patients - Check your Weight same time everyday, if you gain over 2 pounds, or you develop in leg swelling, experience more shortness of breath or chest pain, call your Primary MD immediately. Follow Cardiac Low Salt Diet and 1.5 lit/day fluid restriction.   On your next visit with your primary care physician please Get Medicines reviewed and adjusted.   Please request your Prim.MD to go over all Hospital Tests and Procedure/Radiological results at the follow up, please get all Hospital records sent to your Prim MD by signing hospital release before you go home.   If you experience worsening of your admission symptoms, develop shortness of breath, life threatening emergency, suicidal or homicidal thoughts you must seek medical attention immediately by calling 911 or calling your MD immediately  if symptoms less severe.  You Must read complete instructions/literature along with all the possible adverse reactions/side effects for all the Medicines you take and that have been prescribed to you. Take any new Medicines after you have completely understood and accpet all the possible adverse reactions/side effects.   Do not drive, operating heavy machinery, perform activities at heights, swimming or participation in water activities or provide baby sitting services if your were admitted for syncope or siezures until you have seen by Primary MD or a Neurologist and advised to do so again.  Do not drive when taking Pain medications.    Do not take more than prescribed Pain, Sleep and Anxiety Medications  Special Instructions: If you have smoked or chewed Tobacco  in the last 2 yrs please stop smoking, stop any regular Alcohol  and or any Recreational drug use.  Wear Seat belts while driving.   Please note  You were cared for by a hospitalist during your hospital stay. If you have any questions about your  discharge medications or the care you received while you were in the hospital after you are discharged, you can call the unit and asked to speak with the hospitalist on call if the hospitalist that took care of you is not available. Once you are discharged, your primary care physician will handle any further medical issues. Please note that NO REFILLS for any discharge medications will be authorized once you are discharged, as it is imperative that you return to your primary care physician (or establish a relationship with a primary  care physician if you do not have one) for your aftercare needs so that they can reassess your need for medications and monitor your lab values.   Increase activity slowly   Complete by:  As directed      Allergies as of 07/15/2017      Reactions   Clarithromycin Anaphylaxis, Hives, Shortness Of Breath, Swelling, Other (See Comments)   Mouth swells   Codeine Hives, Rash, Swelling   Ibuprofen Anaphylaxis, Shortness Of Breath   Naproxen Anaphylaxis, Nausea And Vomiting   Nsaids Anaphylaxis   Penicillins Swelling, Rash   Has patient had a PCN reaction causing immediate rash, facial/tongue/throat swelling, SOB or lightheadedness with hypotension: Yes Has patient had a PCN reaction causing severe rash involving mucus membranes or skin necrosis: Unk Has patient had a PCN reaction that required hospitalization: Yes Has patient had a PCN reaction occurring within the last 10 years: Yes If all of the above answers are "NO", then may proceed with Cephalosporin use.      Medication List    STOP taking these medications   doxycycline 100 MG capsule Commonly known as:  VIBRAMYCIN     TAKE these medications   acetaminophen 500 MG tablet Commonly known as:  TYLENOL Take 500-1,000 mg by mouth every 6 (six) hours as needed (for pain or headaches).   albuterol 108 (90 Base) MCG/ACT inhaler Commonly known as:  PROVENTIL HFA;VENTOLIN HFA Inhale 1-2 puffs into the lungs every  6 (six) hours as needed for wheezing or shortness of breath.   albuterol (5 MG/ML) 0.5% nebulizer solution Commonly known as:  PROVENTIL Take 0.5 mLs (2.5 mg total) by nebulization every 6 (six) hours as needed for wheezing or shortness of breath.   famotidine 20 MG tablet Commonly known as:  PEPCID Take 1 tablet (20 mg total) by mouth 2 (two) times daily as needed for heartburn.   fexofenadine 180 MG tablet Commonly known as:  ALLEGRA Take 180 mg by mouth daily.   fluticasone 50 MCG/ACT nasal spray Commonly known as:  FLONASE Place 2 sprays into both nostrils daily.   fluticasone furoate-vilanterol 200-25 MCG/INH Aepb Commonly known as:  BREO ELLIPTA Inhale 1 puff into the lungs daily. What changed:  Another medication with the same name was removed. Continue taking this medication, and follow the directions you see here.   guaiFENesin 600 MG 12 hr tablet Commonly known as:  MUCINEX Take 2 tablets (1,200 mg total) by mouth 2 (two) times daily.   magic mouthwash w/lidocaine Soln Take 5 mLs by mouth 4 (four) times daily as needed for mouth pain.   montelukast 10 MG tablet Commonly known as:  SINGULAIR Take 1 tablet (10 mg total) by mouth daily.   ONE-A-DAY WOMENS PO Take 1 tablet by mouth daily.   oxymetazoline 0.05 % nasal spray Commonly known as:  AFRIN Place 1 spray into both nostrils 2 (two) times daily. Use for 3 days   predniSONE 10 MG tablet Commonly known as:  DELTASONE Take  40 mg oral daily for 2 days, then 30 mg oral daily for 2 days, then 20 mg oral daily for 2 days, then 10 mg oral daily for 2 days, and stop What changed:    medication strength  how much to take  how to take this  when to take this  additional instructions   pseudoephedrine-guaifenesin 60-600 MG 12 hr tablet Commonly known as:  MUCINEX D Take 1 tablet by mouth every 12 (twelve) hours as needed for congestion.  sodium chloride 0.65 % Soln nasal spray Commonly known as:   OCEAN Place 1 spray into both nostrils as needed for congestion.   Vitamin D3 400 units Caps Take 400 Units by mouth daily.         Diet and Activity recommendation: See Discharge Instructions above   Consults obtained -  none   Major procedures and Radiology Reports - PLEASE review detailed and final reports for all details, in brief -      Dg Chest Port 1 View  Result Date: 07/13/2017 CLINICAL DATA:  Shortness of breath EXAM: PORTABLE CHEST 1 VIEW COMPARISON:  06/24/2017 FINDINGS: The heart size and mediastinal contours are within normal limits. Both lungs are clear. The visualized skeletal structures are unremarkable. IMPRESSION: No active disease. Electronically Signed   By: Ulyses Jarred M.D.   On: 07/13/2017 22:45   Dg Chest Port 1 View  Result Date: 06/24/2017 CLINICAL DATA:  Shortness of breath tonight. EXAM: PORTABLE CHEST 1 VIEW COMPARISON:  Frontal and lateral views 05/14/2017. Chest CT 04/27/2017 FINDINGS: The cardiomediastinal contours are normal. The lungs are clear. Pulmonary vasculature is normal. No consolidation, pleural effusion, or pneumothorax. No acute osseous abnormalities are seen. IMPRESSION: Unremarkable radiograph of the chest. Electronically Signed   By: Jeb Levering M.D.   On: 06/24/2017 01:33    Micro Results    No results found for this or any previous visit (from the past 240 hour(s)).     Today   Subjective:   Kendle Turbin today has no headache,no chest abdominal pain,no new weakness tingling or numbness, feels much better wants to go home today. *  Objective:   Blood pressure 117/88, pulse 75, temperature 98.5 F (36.9 C), temperature source Oral, resp. rate 20, height 5\' 8"  (1.727 m), weight 90.2 kg (198 lb 13.7 oz), last menstrual period 03/15/2013, SpO2 95 %.   Intake/Output Summary (Last 24 hours) at 07/15/2017 1204 Last data filed at 07/15/2017 0540 Gross per 24 hour  Intake 360 ml  Output -  Net 360 ml     Exam Awake Alert, Oriented x 3, No new F.N deficits, Normal affect Symmetrical Chest wall movement, Good air movement bilaterally, CTAB RRR,No Gallops,Rubs or new Murmurs, No Parasternal Heave +ve B.Sounds, Abd Soft, Non tender,  No rebound -guarding or rigidity. No Cyanosis, Clubbing or edema, No new Rash or bruise  Data Review   CBC w Diff:  Lab Results  Component Value Date   WBC 11.0 (H) 07/15/2017   HGB 12.6 07/15/2017   HCT 39.0 07/15/2017   PLT 311 07/15/2017   LYMPHOPCT 55 07/13/2017   MONOPCT 10 07/13/2017   EOSPCT 0 07/13/2017   BASOPCT 0 07/13/2017    CMP:  Lab Results  Component Value Date   NA 138 07/15/2017   K 4.1 07/15/2017   CL 108 07/15/2017   CO2 24 07/15/2017   BUN 17 07/15/2017   CREATININE 0.75 07/15/2017   PROT 7.8 07/13/2017   ALBUMIN 4.1 07/13/2017   BILITOT 0.7 07/13/2017   ALKPHOS 67 07/13/2017   AST 20 07/13/2017   ALT 27 07/13/2017  .   Total Time in preparing paper work, data evaluation and todays exam - 42 minutes  Phillips Climes M.D on 07/15/2017 at 12:04 PM  [facility]   Office  [phone]

## 2017-07-15 NOTE — Care Management Note (Signed)
Case Management Note  Patient Details  Name: Linda Dalton MRN: [id number] Date of Birth: November 03, 1975  Subjective/Objective:  From home, presents with acute resp failure, asthma ex, for dc today, she has f/u apt [facility]30 at 1:30 , and she will go to the [facility] for medication.  She will get samples of inhalers.                  Action/Plan: DC home when ready.  Expected Discharge Date:  07/15/17               Expected Discharge Plan:  Home/Self Care  In-House Referral:     Discharge planning Services  CM Consult, [facility], Follow-up appt scheduled, Medication Assistance  Post Acute Care Choice:    Choice offered to:     DME Arranged:    DME Agency:     HH Arranged:    HH Agency:     Status of Service:  Completed, signed off  If discussed at [employer], dates discussed:    Additional Comments:  Zenon Mayo, RN 07/15/2017, 12:59 PM

## 2017-07-15 NOTE — Discharge Instructions (Signed)
Follow with Primary MD  in 7 days   Get CBC, CMP, checked  by Primary MD next visit.    Activity: As tolerated with Full fall precautions use walker/cane & assistance as needed   Disposition Home    Diet: regular diet  For Heart failure patients - Check your Weight same time everyday, if you gain over 2 pounds, or you develop in leg swelling, experience more shortness of breath or chest pain, call your Primary MD immediately. Follow Cardiac Low Salt Diet and 1.5 lit/day fluid restriction.   On your next visit with your primary care physician please Get Medicines reviewed and adjusted.   Please request your Prim.MD to go over all Hospital Tests and Procedure/Radiological results at the follow up, please get all Hospital records sent to your Prim MD by signing hospital release before you go home.   If you experience worsening of your admission symptoms, develop shortness of breath, life threatening emergency, suicidal or homicidal thoughts you must seek medical attention immediately by calling 911 or calling your MD immediately  if symptoms less severe.  You Must read complete instructions/literature along with all the possible adverse reactions/side effects for all the Medicines you take and that have been prescribed to you. Take any new Medicines after you have completely understood and accpet all the possible adverse reactions/side effects.   Do not drive, operating heavy machinery, perform activities at heights, swimming or participation in water activities or provide baby sitting services if your were admitted for syncope or siezures until you have seen by Primary MD or a Neurologist and advised to do so again.  Do not drive when taking Pain medications.    Do not take more than prescribed Pain, Sleep and Anxiety Medications  Special Instructions: If you have smoked or chewed Tobacco  in the last 2 yrs please stop smoking, stop any regular Alcohol  and or any Recreational drug  use.  Wear Seat belts while driving.   Please note  You were cared for by a hospitalist during your hospital stay. If you have any questions about your discharge medications or the care you received while you were in the hospital after you are discharged, you can call the unit and asked to speak with the hospitalist on call if the hospitalist that took care of you is not available. Once you are discharged, your primary care physician will handle any further medical issues. Please note that NO REFILLS for any discharge medications will be authorized once you are discharged, as it is imperative that you return to your primary care physician (or establish a relationship with a primary care physician if you do not have one) for your aftercare needs so that they can reassess your need for medications and monitor your lab values.

## 2017-07-17 NOTE — ED Provider Notes (Signed)
[facility] Provider Note   CSN: [id number] Arrival date & time: 07/13/17  2225     History   Chief Complaint Chief Complaint  Patient presents with  . Respiratory Distress    HPI Linda Dalton is a 42 y.o. female.  HPI Patient has history of recurrent asthma.  Reports this evening she had eaten dinner and she started to feel like she is getting an asthma attack.  She tried her inhaler but symptoms kept progressing and got much worse quickly.  She reports she became extremely short of breath.  Diffuse chest tightness but no sharp or localizing pain.  Nuys recent fever or productive cough.  Patient was seen in the emergency department 2 days earlier and treated with good response then discharged. Past Medical History:  Diagnosis Date  . Asthma   . DVT (deep venous thrombosis) ([place])   . Fibroid tumor   . PONV (postoperative nausea and vomiting)   . Presence of IVC filter   . Sinusitis   . Tonsillitis     Patient Active Problem List   Diagnosis Date Noted  . Acute respiratory failure with hypoxia and hypercapnia ([place]) 07/13/2017  . Asthma exacerbation 04/28/2016  . Moderate persistent asthma with exacerbation 03/05/2016  . Trimalleolar fracture of right ankle 02/02/2014  . DVT (deep venous thrombosis) ([place]) 05/01/2013  . Acute DVT (deep venous thrombosis) ([place]) 05/01/2013  . ASTHMA 02/04/2007  . G E REFLUX 02/04/2007    Past Surgical History:  Procedure Laterality Date  . ABDOMINAL HYSTERECTOMY    . BREAST SURGERY    . ORIF ANKLE FRACTURE Right 02/02/2014   Procedure: OPEN REDUCTION INTERNAL FIXATION (ORIF) RIGHT TRIMALLEOLAR ANKLE FRACTURE;  Surgeon: Marianna Payment, MD;  Location: [facility];  Service: Orthopedics;  Laterality: Right;  . TUBAL LIGATION       OB History   None      Home Medications    Prior to Admission medications   Medication Sig Start Date End Date Taking? Authorizing Provider  acetaminophen (TYLENOL) 500 MG  tablet Take 500-1,000 mg by mouth every 6 (six) hours as needed (for pain or headaches).   Yes [provider]  albuterol (PROVENTIL) (5 MG/ML) 0.5% nebulizer solution Take 0.5 mLs (2.5 mg total) by nebulization every 6 (six) hours as needed for wheezing or shortness of breath. 07/10/17  Yes Recardo Evangelist, PA-C  Cholecalciferol (VITAMIN D3) 400 units CAPS Take 400 Units by mouth daily.   Yes [provider]  fexofenadine (ALLEGRA) 180 MG tablet Take 180 mg by mouth daily.   Yes [provider]  fluticasone (FLONASE) 50 MCG/ACT nasal spray Place 2 sprays into both nostrils daily. 05/14/17  Yes Hedges, Dellis Filbert, PA-C  montelukast (SINGULAIR) 10 MG tablet Take 1 tablet (10 mg total) by mouth daily. 04/29/16  Yes Geradine Girt, DO  Multiple Vitamins-Calcium (ONE-A-DAY WOMENS PO) Take 1 tablet by mouth daily.   Yes [provider]  oxymetazoline (AFRIN) 0.05 % nasal spray Place 1 spray into both nostrils 2 (two) times daily. Use for 3 days 04/29/16  Yes Geradine Girt, DO  pseudoephedrine-guaifenesin (MUCINEX D) 60-600 MG 12 hr tablet Take 1 tablet by mouth every 12 (twelve) hours as needed for congestion.   Yes [provider]  albuterol (PROVENTIL HFA;VENTOLIN HFA) 108 (90 Base) MCG/ACT inhaler Inhale 1-2 puffs into the lungs every 6 (six) hours as needed for wheezing or shortness of breath. Patient not taking: Reported on 07/13/2017  07/10/17   Recardo Evangelist, PA-C  famotidine (PEPCID) 20 MG tablet Take 1 tablet (20 mg total) by mouth 2 (two) times daily as needed for heartburn. Patient not taking: Reported on 07/13/2017 04/29/16   Geradine Girt, DO  fluticasone furoate-vilanterol (BREO ELLIPTA) 200-25 MCG/INH AEPB Inhale 1 puff into the lungs daily. 07/15/17   Elgergawy, Silver Huguenin, MD  guaiFENesin (MUCINEX) 600 MG 12 hr tablet Take 2 tablets (1,200 mg total) by mouth 2 (two) times daily. Patient not taking: Reported on 07/14/2017 04/29/16   Geradine Girt, DO    magic mouthwash w/lidocaine SOLN Take 5 mLs by mouth 4 (four) times daily as needed for mouth pain. Patient not taking: Reported on 07/14/2017 02/10/17   Charlesetta Shanks, MD  predniSONE (DELTASONE) 10 MG tablet Take  40 mg oral daily for 2 days, then 30 mg oral daily for 2 days, then 20 mg oral daily for 2 days, then 10 mg oral daily for 2 days, and stop 07/15/17   Elgergawy, Silver Huguenin, MD  sodium chloride (OCEAN) 0.65 % SOLN nasal spray Place 1 spray into both nostrils as needed for congestion. Patient not taking: Reported on 07/14/2017 04/29/16   Geradine Girt, DO    Family History Family History  Problem Relation Age of Onset  . Congestive Heart Failure Mother   . Diabetes Mother   . Asthma Mother   . Hypertension Mother   . Asthma Father   . Sleep apnea Father   . Hypercholesterolemia Father   . Heart murmur Sister   . Heart disease Brother   . Cancer Other   . Congestive Heart Failure Other     Social History Social History   Tobacco Use  . Smoking status: Former Smoker    Packs/day: 1.00    Years: 8.00    Pack years: 8.00    Types: Cigarettes    Last attempt to quit: 07/14/2014    Years since quitting: 3.0  . Smokeless tobacco: Never Used  Substance Use Topics  . Alcohol use: Yes    Alcohol/week: 0.0 oz    Comment: rare  . Drug use: No     Allergies   Clarithromycin; Codeine; Ibuprofen; Naproxen; Nsaids; and Penicillins   Review of Systems Review of Systems 10 Systems reviewed and are negative for acute change except as noted in the HPI.   Physical Exam Updated Vital Signs BP 117/88 (BP Location: Left Arm)   Pulse 75   Temp 98.5 F (36.9 C) (Oral)   Resp 20   Ht 5\' 8"  (1.727 m)   Wt 90.2 kg (198 lb 13.7 oz)   LMP 03/15/2013   SpO2 95%   BMI 30.24 kg/m   Physical Exam  Constitutional: She is oriented to person, place, and time.  Patient arrives on BiPAP sitting upright with respiratory distress but alert mental status.  Significant increased work of  breathing.  HENT:  Oral cavity is without pooling secretions.  She is able to talk through the BiPAP.  Eyes: EOM are normal.  Cardiovascular:  Tachycardia.  Pulses intact.  Pulmonary/Chest:  Moderate to severe increased work of breathing.  Patient is alert.  She can talk in short sentences through the BiPAP.  Very diminished breath sounds throughout.  Abdominal: Soft. She exhibits no distension. There is no tenderness.  Musculoskeletal: Normal range of motion. She exhibits no edema or tenderness.  Neurological: She is alert and oriented to person, place, and time. She exhibits normal muscle tone. Coordination  normal.  Skin: Skin is warm.  Is warm and slightly diaphoretic.  Psychiatric:  Anxious.     ED Treatments / Results  Labs (all labs ordered are listed, but only abnormal results are displayed) Labs Reviewed  COMPREHENSIVE METABOLIC PANEL - Abnormal; Notable for the following components:      Result Value   Glucose, Bld 105 (*)    All other components within normal limits  CBC WITH DIFFERENTIAL/PLATELET - Abnormal; Notable for the following components:   WBC 10.7 (*)    Lymphs Abs 5.8 (*)    All other components within normal limits  MAGNESIUM - Abnormal; Notable for the following components:   Magnesium 2.7 (*)    All other components within normal limits  PHOSPHORUS - Abnormal; Notable for the following components:   Phosphorus 5.1 (*)    All other components within normal limits  URINALYSIS, ROUTINE W REFLEX MICROSCOPIC - Abnormal; Notable for the following components:   APPearance CLOUDY (*)    Hgb urine dipstick MODERATE (*)    Bacteria, UA RARE (*)    All other components within normal limits  GLUCOSE, CAPILLARY - Abnormal; Notable for the following components:   Glucose-Capillary 125 (*)    All other components within normal limits  BASIC METABOLIC PANEL - Abnormal; Notable for the following components:   Glucose, Bld 140 (*)    All other components within  normal limits  CBC - Abnormal; Notable for the following components:   WBC 11.7 (*)    All other components within normal limits  CBC - Abnormal; Notable for the following components:   WBC 11.0 (*)    All other components within normal limits  BASIC METABOLIC PANEL - Abnormal; Notable for the following components:   Glucose, Bld 121 (*)    Calcium 8.8 (*)    All other components within normal limits  I-STAT ARTERIAL BLOOD GAS, ED - Abnormal; Notable for the following components:   pCO2 arterial 48.4 (*)    pO2, Arterial 442.0 (*)    All other components within normal limits  HIV ANTIBODY (ROUTINE TESTING)  LACTIC ACID, PLASMA  I-STAT CG4 LACTIC ACID, ED  I-STAT TROPONIN, ED  I-STAT BETA HCG BLOOD, ED ([facility], [facility], [facility] ONLY)    EKG EKG Interpretation  Date/Time:  Monday July 13 2017 22:34:21 EDT Ventricular Rate:  125 PR Interval:    QRS Duration: 80 QT Interval:  305 QTC Calculation: 440 R Axis:   88 Text Interpretation:  Sinus tachycardia Consider right atrial enlargement Minimal ST depression, inferior leads No significant change since last tracing Confirmed by Theotis Burrow [phone]) on 07/14/2017 2:48:13 PM   Radiology No results found.  Procedures Procedures (including critical care time) CRITICAL CARE Performed by: Charlesetta Shanks   Total critical care time:45 minutes  Critical care time was exclusive of separately billable procedures and treating other patients.  Critical care was necessary to treat or prevent imminent or life-threatening deterioration.  Critical care was time spent personally by me on the following activities: development of treatment plan with patient and/or surrogate as well as nursing, discussions with consultants, evaluation of patient's response to treatment, examination of patient, obtaining history from patient or surrogate, ordering and performing treatments and interventions, ordering and review of laboratory studies, ordering and review of  radiographic studies, pulse oximetry and re-evaluation of patient's condition. Medications Ordered in ED Medications  albuterol (PROVENTIL,VENTOLIN) solution continuous neb (10 mg/hr Nebulization Given 07/13/17 2237)  0.9 %  sodium chloride infusion (  Intravenous Stopped 07/14/17 1151)     Initial Impression / Assessment and Plan / ED Course  I have reviewed the triage vital signs and the nursing notes.  Pertinent labs & imaging results that were available during my care of the patient were reviewed by me and considered in my medical decision making (see chart for details).     Recheck: Multiple rechecks done on patient.  After about 15 minutes she started to show significant improvement.  Re-auscultation for airflow through the lungs now with coarse wheeze throughout.  BiPAP and albuterol continued.  Patient had been given mag and epinephrine and Solu-Medrol in the field.  She continued improvement with increased appearance of comfort and speaking more clearly.  She did continue to have significant amount of wheezing.  Patient was maintained on BiPAP. Consult: Dr. Hal Hope for admission. Final Clinical Impressions(s) / ED Diagnoses   Final diagnoses:  Severe persistent asthma with status asthmaticus    Presented with severe and rapid onset of asthma exacerbation.  This required BiPAP and treatment in the field with Solu-Medrol, magnesium epinephrine and DuoNeb.  Patient maintained on BiPAP and continues albuterol with improvement.  She did not require intubation.  Ental status remained alert.  Comfort level improved.   Charlesetta Shanks, MD 07/17/17 [phone]

## 2017-08-11 ENCOUNTER — Ambulatory Visit (INDEPENDENT_AMBULATORY_CARE_PROVIDER_SITE_OTHER): Payer: Self-pay | Admitting: Physician Assistant

## 2017-08-11 ENCOUNTER — Encounter (INDEPENDENT_AMBULATORY_CARE_PROVIDER_SITE_OTHER): Payer: Self-pay | Admitting: Physician Assistant

## 2017-08-11 ENCOUNTER — Other Ambulatory Visit: Payer: Self-pay

## 2017-08-11 VITALS — BP 125/81 | HR 78 | Temp 98.1°F | Ht 68.0 in | Wt 207.2 lb

## 2017-08-11 DIAGNOSIS — G8929 Other chronic pain: Secondary | ICD-10-CM

## 2017-08-11 DIAGNOSIS — K089 Disorder of teeth and supporting structures, unspecified: Secondary | ICD-10-CM

## 2017-08-11 DIAGNOSIS — J4552 Severe persistent asthma with status asthmaticus: Secondary | ICD-10-CM

## 2017-08-11 MED ORDER — ALBUTEROL SULFATE (5 MG/ML) 0.5% IN NEBU: 2.5000 mg | INHALATION_SOLUTION | Freq: Four times a day (QID) | RESPIRATORY_TRACT | 11 refills | Status: DC | PRN

## 2017-08-11 MED ORDER — MONTELUKAST SODIUM 10 MG PO TABS: 10.0000 mg | ORAL_TABLET | Freq: Every day | ORAL | 11 refills | Status: DC

## 2017-08-11 MED ORDER — FLUTICASONE FUROATE-VILANTEROL 200-25 MCG/INH IN AEPB: 1.0000 | INHALATION_SPRAY | Freq: Every day | RESPIRATORY_TRACT | 5 refills | Status: DC

## 2017-08-11 NOTE — Progress Notes (Signed)
Subjective:  Patient ID: Linda Dalton, female    DOB: 20-Dec-1975  Age: 42 y.o. MRN: [id number]  CC: hospital f/u   HPI Linda Dalton is a 42 y.o. female with a medical history of asthma and DVT s/p IVC filter presents as a new patient on hospital f/u. Admitted on 07/13/17 with acute respiratory failure and asthma exacerbation. Discharged on 07/15/17. Had been unable to afford inhalers which led to her hospitalization. Says she is feeling better now that she is taking her inhalers. Says she has a strong family history of severe asthma with many family members passing away due to asthma. Does not endorse SOB, wheezing, HA, CP, palpitations, abdominal pain, f/c/n/v, rash, swelling, GI/GU.     Also states she has left upper and lower molar pain since three months ago. Relief with "swishing mouthwash".      PHQ9 13 and GAD7 12 in clinic today. Attributed to taking steroids for her asthma attacks. Also feels stress from having to stay at home due to asthma attacks.  Denies suicidal or homicidal ideation/intent.    Outpatient Medications Prior to Visit  Medication Sig Dispense Refill  . acetaminophen (TYLENOL) 500 MG tablet Take 500-1,000 mg by mouth every 6 (six) hours as needed (for pain or headaches).    Marland Kitchen albuterol (PROVENTIL HFA;VENTOLIN HFA) 108 (90 Base) MCG/ACT inhaler Inhale 1-2 puffs into the lungs every 6 (six) hours as needed for wheezing or shortness of breath. (Patient not taking: Reported on 07/13/2017) 1 Inhaler 0  . albuterol (PROVENTIL) (5 MG/ML) 0.5% nebulizer solution Take 0.5 mLs (2.5 mg total) by nebulization every 6 (six) hours as needed for wheezing or shortness of breath. 20 vial 0  . Cholecalciferol (VITAMIN D3) 400 units CAPS Take 400 Units by mouth daily.    . famotidine (PEPCID) 20 MG tablet Take 1 tablet (20 mg total) by mouth 2 (two) times daily as needed for heartburn. (Patient not taking: Reported on 07/13/2017) 60 tablet 0  . fexofenadine (ALLEGRA) 180 MG tablet  Take 180 mg by mouth daily.    . fluticasone (FLONASE) 50 MCG/ACT nasal spray Place 2 sprays into both nostrils daily. 15.8 g 0  . fluticasone furoate-vilanterol (BREO ELLIPTA) 200-25 MCG/INH AEPB Inhale 1 puff into the lungs daily. 28 each 3  . guaiFENesin (MUCINEX) 600 MG 12 hr tablet Take 2 tablets (1,200 mg total) by mouth 2 (two) times daily. (Patient not taking: Reported on 07/14/2017)    . magic mouthwash w/lidocaine SOLN Take 5 mLs by mouth 4 (four) times daily as needed for mouth pain. (Patient not taking: Reported on 07/14/2017) 5 mL 0  . montelukast (SINGULAIR) 10 MG tablet Take 1 tablet (10 mg total) by mouth daily. 30 tablet 0  . Multiple Vitamins-Calcium (ONE-A-DAY WOMENS PO) Take 1 tablet by mouth daily.    Marland Kitchen oxymetazoline (AFRIN) 0.05 % nasal spray Place 1 spray into both nostrils 2 (two) times daily. Use for 3 days 30 mL 0  . predniSONE (DELTASONE) 10 MG tablet Take  40 mg oral daily for 2 days, then 30 mg oral daily for 2 days, then 20 mg oral daily for 2 days, then 10 mg oral daily for 2 days, and stop 20 tablet 0  . pseudoephedrine-guaifenesin (MUCINEX D) 60-600 MG 12 hr tablet Take 1 tablet by mouth every 12 (twelve) hours as needed for congestion.    . sodium chloride (OCEAN) 0.65 % SOLN nasal spray Place 1 spray into both nostrils as needed for congestion. (  Patient not taking: Reported on 07/14/2017)  0   No facility-administered medications prior to visit.      ROS Review of Systems  Constitutional: Negative for chills, fever and malaise/fatigue.  Eyes: Negative for blurred vision.  Respiratory: Negative for shortness of breath.   Cardiovascular: Negative for chest pain and palpitations.  Gastrointestinal: Negative for abdominal pain and nausea.  Genitourinary: Negative for dysuria and hematuria.  Musculoskeletal: Negative for joint pain and myalgias.  Skin: Negative for rash.  Neurological: Negative for tingling and headaches.  Psychiatric/Behavioral: Negative for  depression. The patient is not nervous/anxious.     Objective:  Ht 5\' 8"  (1.727 m)   Wt 207 lb 3.2 oz (94 kg)   LMP 03/15/2013   BMI 31.50 kg/m   BP/Weight 08/11/2017 04/14/6832 01/22/6220  Systolic BP - 979 -  Diastolic BP - 88 -  Wt. (Lbs) 207.2 - 198.85  BMI 31.5 - 30.24      Physical Exam  Constitutional: She is oriented to person, place, and time.  Well developed, well nourished, NAD, polite  HENT:  Head: Normocephalic and atraumatic.  Eyes: No scleral icterus.  Neck: Normal range of motion. Neck supple. No thyromegaly present.  Cardiovascular: Normal rate, regular rhythm and normal heart sounds.  Pulmonary/Chest: Effort normal and breath sounds normal.  Musculoskeletal: She exhibits no edema.  Neurological: She is alert and oriented to person, place, and time.  Skin: Skin is warm and dry. No rash noted. No erythema. No pallor.  Psychiatric: She has a normal mood and affect. Her behavior is normal. Thought content normal.  Vitals reviewed.    Assessment & Plan:   1. Severe persistent asthma with status asthmaticus - Resolved with use of inhalers - CBC with Differential - Basic Metabolic Panel - Refill fluticasone furoate-vilanterol (BREO ELLIPTA) 200-25 MCG/INH AEPB; Inhale 1 puff into the lungs daily.  Dispense: 60 each; Refill: 5 - Refill montelukast (SINGULAIR) 10 MG tablet; Take 1 tablet (10 mg total) by mouth daily.  Dispense: 30 tablet; Refill: 11 - Refill albuterol (PROVENTIL) (5 MG/ML) 0.5% nebulizer solution; Take 0.5 mLs (2.5 mg total) by nebulization every 6 (six) hours as needed for wheezing or shortness of breath.  Dispense: 20 vial; Refill: 11  2. Chronic dental pain - Patient given CAFA application and advised to obtain [facility] Card so that she may be referred to [facility].   Meds ordered this encounter  Medications  . fluticasone furoate-vilanterol (BREO ELLIPTA) 200-25 MCG/INH AEPB    Sig: Inhale 1 puff into the lungs daily.     Dispense:  60 each    Refill:  5    Order Specific Question:   Supervising Provider    Answer:   Charlott Rakes [[id number]]  . montelukast (SINGULAIR) 10 MG tablet    Sig: Take 1 tablet (10 mg total) by mouth daily.    Dispense:  30 tablet    Refill:  11    Order Specific Question:   Supervising Provider    Answer:   Charlott Rakes [[id number]]  . albuterol (PROVENTIL) (5 MG/ML) 0.5% nebulizer solution    Sig: Take 0.5 mLs (2.5 mg total) by nebulization every 6 (six) hours as needed for wheezing or shortness of breath.    Dispense:  20 vial    Refill:  11    Order Specific Question:   Supervising Provider    Answer:   Charlott Rakes [[id number]]    Follow-up: Return in about 6 weeks (around 09/22/2017) for  annual physical and PAP.   Clent Demark PA

## 2017-08-11 NOTE — Patient Instructions (Signed)

## 2017-08-12 ENCOUNTER — Telehealth (INDEPENDENT_AMBULATORY_CARE_PROVIDER_SITE_OTHER): Payer: Self-pay

## 2017-08-12 LAB — BASIC METABOLIC PANEL
BUN/Creatinine Ratio: 13 (ref 9–23)
BUN: 9 mg/dL (ref 6–24)
CO2: 23 mmol/L (ref 20–29)
CREATININE: 0.7 mg/dL (ref 0.57–1.00)
Calcium: 9.3 mg/dL (ref 8.7–10.2)
Chloride: 103 mmol/L (ref 96–106)
GFR, EST AFRICAN AMERICAN: 125 mL/min/{1.73_m2} (ref >59)
GFR, EST NON AFRICAN AMERICAN: 109 mL/min/{1.73_m2} (ref >59)
Glucose: 81 mg/dL (ref 65–99)
POTASSIUM: 4.2 mmol/L (ref 3.5–5.2)
Sodium: 139 mmol/L (ref 134–144)

## 2017-08-12 LAB — CBC WITH DIFFERENTIAL/PLATELET
BASOS: 1 %
Basophils Absolute: 0.1 10*3/uL (ref 0.0–0.2)
EOS (ABSOLUTE): 0.7 10*3/uL — ABNORMAL HIGH (ref 0.0–0.4)
EOS: 12 %
HEMATOCRIT: 40.9 % (ref 34.0–46.6)
HEMOGLOBIN: 13.4 g/dL (ref 11.1–15.9)
IMMATURE GRANULOCYTES: 0 %
Immature Grans (Abs): 0 10*3/uL (ref 0.0–0.1)
LYMPHS ABS: 2.1 10*3/uL (ref 0.7–3.1)
Lymphs: 33 %
MCH: 30.5 pg (ref 26.6–33.0)
MCHC: 32.8 g/dL (ref 31.5–35.7)
MCV: 93 fL (ref 79–97)
MONOS ABS: 0.4 10*3/uL (ref 0.1–0.9)
Monocytes: 7 %
Neutrophils Absolute: 2.9 10*3/uL (ref 1.4–7.0)
Neutrophils: 47 %
Platelets: 257 10*3/uL (ref 150–450)
RBC: 4.39 x10E6/uL (ref 3.77–5.28)
RDW: 13.7 % (ref 12.3–15.4)
WBC: 6.2 10*3/uL (ref 3.4–10.8)

## 2017-08-12 NOTE — Telephone Encounter (Signed)
Patient aware of all normal labs. Nat Christen, CMA

## 2017-08-12 NOTE — Telephone Encounter (Signed)
-----   Message from Clent Demark, PA-C sent at 08/12/2017  8:39 AM EDT ----- Labs normal.

## 2017-08-17 ENCOUNTER — Ambulatory Visit: Payer: Self-pay | Attending: Physician Assistant

## 2017-09-22 ENCOUNTER — Ambulatory Visit (INDEPENDENT_AMBULATORY_CARE_PROVIDER_SITE_OTHER): Payer: Self-pay | Admitting: Physician Assistant

## 2017-09-22 ENCOUNTER — Other Ambulatory Visit (HOSPITAL_COMMUNITY)
Admission: RE | Admit: 2017-09-22 | Discharge: 2017-09-22 | Disposition: A | Discharge status: Home / Self Care | Payer: Self-pay | Source: Ambulatory Visit | Attending: Physician Assistant | Admitting: Physician Assistant

## 2017-09-22 ENCOUNTER — Encounter (INDEPENDENT_AMBULATORY_CARE_PROVIDER_SITE_OTHER): Payer: Self-pay | Admitting: Physician Assistant

## 2017-09-22 ENCOUNTER — Other Ambulatory Visit: Payer: Self-pay

## 2017-09-22 VITALS — BP 111/72 | HR 78 | Temp 97.4°F | Wt 208.0 lb

## 2017-09-22 DIAGNOSIS — Z124 Encounter for screening for malignant neoplasm of cervix: Secondary | ICD-10-CM | POA: Insufficient documentation

## 2017-09-22 DIAGNOSIS — Z0000 Encounter for general adult medical examination without abnormal findings: Secondary | ICD-10-CM

## 2017-09-22 DIAGNOSIS — Z1272 Encounter for screening for malignant neoplasm of vagina: Secondary | ICD-10-CM

## 2017-09-22 DIAGNOSIS — Z23 Encounter for immunization: Secondary | ICD-10-CM

## 2017-09-22 NOTE — Progress Notes (Addendum)
Subjective:  Patient ID: Linda Dalton, female    DOB: 10-Oct-1975  Age: 42 y.o. MRN: [id number]  CC: pap smear. Annual exam.  HPI Linda Dalton is a 42 y.o. female with a medical history of asthma, fibroid tumor s/p hysterectomy, DVT s/p IVC filter presents for a vaginal pap smear. Had hysterectomy approximately three years ago. Denies vaginal discharge, itching, dyspareunia, or pelvic pain. Denies any other symptoms or complaints.     Outpatient Medications Prior to Visit  Medication Sig Dispense Refill  . acetaminophen (TYLENOL) 500 MG tablet Take 500-1,000 mg by mouth every 6 (six) hours as needed (for pain or headaches).    Marland Kitchen albuterol (PROVENTIL HFA;VENTOLIN HFA) 108 (90 Base) MCG/ACT inhaler Inhale 1-2 puffs into the lungs every 6 (six) hours as needed for wheezing or shortness of breath. 1 Inhaler 0  . albuterol (PROVENTIL) (5 MG/ML) 0.5% nebulizer solution Take 0.5 mLs (2.5 mg total) by nebulization every 6 (six) hours as needed for wheezing or shortness of breath. 20 vial 11  . fexofenadine (ALLEGRA) 180 MG tablet Take 180 mg by mouth daily.    . fluticasone (FLONASE) 50 MCG/ACT nasal spray Place 2 sprays into both nostrils daily. 15.8 g 0  . fluticasone furoate-vilanterol (BREO ELLIPTA) 200-25 MCG/INH AEPB Inhale 1 puff into the lungs daily. 60 each 5  . montelukast (SINGULAIR) 10 MG tablet Take 1 tablet (10 mg total) by mouth daily. 30 tablet 11  . Multiple Vitamins-Calcium (ONE-A-DAY WOMENS PO) Take 1 tablet by mouth daily.     No facility-administered medications prior to visit.      ROS Review of Systems  Constitutional: Negative for chills, fever and malaise/fatigue.  HENT: Positive for congestion.   Eyes: Negative for blurred vision.  Respiratory: Negative for shortness of breath.   Cardiovascular: Negative for chest pain and palpitations.  Gastrointestinal: Negative for abdominal pain and nausea.  Genitourinary: Negative for dysuria and hematuria.   Musculoskeletal: Negative for joint pain and myalgias.  Skin: Negative for rash.  Neurological: Negative for tingling and headaches.       Loss of smell  Psychiatric/Behavioral: Negative for depression. The patient is not nervous/anxious.     Objective:  BP 111/72 (BP Location: Left Arm, Patient Position: Sitting, Cuff Size: Large)   Pulse 78   Temp (!) 97.4 F (36.3 C) (Oral)   Wt 208 lb (94.3 kg)   LMP 03/15/2013   SpO2 97%   BMI 31.63 kg/m   BP/Weight 09/22/2017 5/63/8937 03/17/2874  Systolic BP 811 572 620  Diastolic BP 72 81 88  Wt. (Lbs) 208 207.2 -  BMI 31.63 31.5 -      Physical Exam  Constitutional: She is oriented to person, place, and time.  Well developed, overweight, NAD, polite  HENT:  Head: Normocephalic and atraumatic.  Eyes: Conjunctivae are normal. No scleral icterus.  Neck: Normal range of motion. Neck supple. No thyromegaly present.  Cardiovascular: Normal rate, regular rhythm, normal heart sounds and intact distal pulses.  Pulmonary/Chest: Effort normal and breath sounds normal.  Abdominal: Soft. Bowel sounds are normal. There is no tenderness.  Genitourinary:  Genitourinary Comments: White non-odorous vaginal discharge. Cervix absent. No adnexal mass or tenderness bilaterally.  Musculoskeletal: Normal range of motion. She exhibits no edema or deformity.  Full aROM of the UEs, LEs, and back.   Neurological: She is alert and oriented to person, place, and time.  Strength 5/5 throughout with exception of left hip flexion which is 4/5.  Skin: Skin  is warm and dry. No rash noted. No erythema. No pallor.  Psychiatric: She has a normal mood and affect. Her behavior is normal. Thought content normal.  Vitals reviewed.    Assessment & Plan:    1. Screening for vaginal cancer - Cytology - PAP([facility])  2. Annual physical exam - CBC with Differential - Comprehensive metabolic panel - Lipid panel  3. Need for prophylactic vaccination and  inoculation against influenza - Flu Vaccine QUAD 6+ mos PF IM (Fluarix Quad PF)     Follow-up: Return if symptoms worsen or fail to improve.   Clent Demark PA

## 2017-09-22 NOTE — Patient Instructions (Signed)

## 2017-09-23 ENCOUNTER — Other Ambulatory Visit (INDEPENDENT_AMBULATORY_CARE_PROVIDER_SITE_OTHER): Payer: Self-pay | Admitting: Physician Assistant

## 2017-09-23 DIAGNOSIS — N760 Acute vaginitis: Principal | ICD-10-CM

## 2017-09-23 DIAGNOSIS — B9689 Other specified bacterial agents as the cause of diseases classified elsewhere: Secondary | ICD-10-CM

## 2017-09-23 LAB — CBC WITH DIFFERENTIAL/PLATELET
BASOS ABS: 0 10*3/uL (ref 0.0–0.2)
Basos: 1 %
EOS (ABSOLUTE): 0.7 10*3/uL — ABNORMAL HIGH (ref 0.0–0.4)
EOS: 15 %
HEMATOCRIT: 41.5 % (ref 34.0–46.6)
Hemoglobin: 13.6 g/dL (ref 11.1–15.9)
Immature Grans (Abs): 0 10*3/uL (ref 0.0–0.1)
Immature Granulocytes: 0 %
LYMPHS ABS: 2.4 10*3/uL (ref 0.7–3.1)
Lymphs: 47 %
MCH: 30.7 pg (ref 26.6–33.0)
MCHC: 32.8 g/dL (ref 31.5–35.7)
MCV: 94 fL (ref 79–97)
Monocytes Absolute: 0.4 10*3/uL (ref 0.1–0.9)
Monocytes: 8 %
Neutrophils Absolute: 1.5 10*3/uL (ref 1.4–7.0)
Neutrophils: 29 %
Platelets: 301 10*3/uL (ref 150–450)
RBC: 4.43 x10E6/uL (ref 3.77–5.28)
RDW: 13.6 % (ref 12.3–15.4)
WBC: 5.1 10*3/uL (ref 3.4–10.8)

## 2017-09-23 LAB — COMPREHENSIVE METABOLIC PANEL
A/G RATIO: 1.5 (ref 1.2–2.2)
ALT: 28 IU/L (ref 0–32)
AST: 20 IU/L (ref 0–40)
Albumin: 4.4 g/dL (ref 3.5–5.5)
Alkaline Phosphatase: 92 IU/L (ref 39–117)
BILIRUBIN TOTAL: 0.6 mg/dL (ref 0.0–1.2)
BUN / CREAT RATIO: 15 (ref 9–23)
BUN: 13 mg/dL (ref 6–24)
CHLORIDE: 102 mmol/L (ref 96–106)
CO2: 22 mmol/L (ref 20–29)
Calcium: 9.4 mg/dL (ref 8.7–10.2)
Creatinine, Ser: 0.85 mg/dL (ref 0.57–1.00)
GFR calc non Af Amer: 86 mL/min/{1.73_m2} (ref >59)
GFR, EST AFRICAN AMERICAN: 99 mL/min/{1.73_m2} (ref >59)
Globulin, Total: 3 g/dL (ref 1.5–4.5)
Glucose: 82 mg/dL (ref 65–99)
POTASSIUM: 4.1 mmol/L (ref 3.5–5.2)
SODIUM: 140 mmol/L (ref 134–144)
TOTAL PROTEIN: 7.4 g/dL (ref 6.0–8.5)

## 2017-09-23 LAB — CYTOLOGY - PAP
Bacterial vaginitis: POSITIVE — AB
CANDIDA VAGINITIS: NEGATIVE
CHLAMYDIA, DNA PROBE: NEGATIVE
Diagnosis: NEGATIVE
Neisseria Gonorrhea: NEGATIVE
Trichomonas: NEGATIVE

## 2017-09-23 LAB — LIPID PANEL
Chol/HDL Ratio: 3.6 ratio (ref 0.0–4.4)
Cholesterol, Total: 158 mg/dL (ref 100–199)
HDL: 44 mg/dL (ref >39)
LDL Calculated: 94 mg/dL (ref 0–99)
Triglycerides: 102 mg/dL (ref 0–149)
VLDL Cholesterol Cal: 20 mg/dL (ref 5–40)

## 2017-09-23 MED ORDER — METRONIDAZOLE 500 MG PO TABS: 500.0000 mg | ORAL_TABLET | Freq: Two times a day (BID) | ORAL | 0 refills | Status: AC

## 2017-09-24 ENCOUNTER — Telehealth (INDEPENDENT_AMBULATORY_CARE_PROVIDER_SITE_OTHER): Payer: Self-pay

## 2017-09-24 NOTE — Telephone Encounter (Signed)
Patient is aware that labs are normal. Pap normal except positive for BV; metronidazole sent to [place]. Nat Christen, CMA

## 2017-09-24 NOTE — Telephone Encounter (Signed)
-----   Message from Clent Demark, PA-C sent at 09/23/2017  6:27 PM EDT ----- Labs normal. Pap is positive for BV. I have sent Metronidazole to [facility].

## 2017-11-04 ENCOUNTER — Ambulatory Visit: Payer: Self-pay

## 2017-12-07 ENCOUNTER — Telehealth (INDEPENDENT_AMBULATORY_CARE_PROVIDER_SITE_OTHER): Payer: Self-pay | Admitting: Physician Assistant

## 2017-12-07 ENCOUNTER — Other Ambulatory Visit (INDEPENDENT_AMBULATORY_CARE_PROVIDER_SITE_OTHER): Payer: Self-pay | Admitting: Physician Assistant

## 2017-12-07 DIAGNOSIS — J4552 Severe persistent asthma with status asthmaticus: Secondary | ICD-10-CM

## 2017-12-07 MED ORDER — BUDESONIDE-FORMOTEROL FUMARATE 160-4.5 MCG/ACT IN AERO: 2.0000 | INHALATION_SPRAY | Freq: Two times a day (BID) | RESPIRATORY_TRACT | 3 refills | Status: DC

## 2017-12-07 MED ORDER — ALBUTEROL SULFATE HFA 108 (90 BASE) MCG/ACT IN AERS: 1.0000 | INHALATION_SPRAY | Freq: Four times a day (QID) | RESPIRATORY_TRACT | 0 refills | Status: DC | PRN

## 2017-12-07 NOTE — Telephone Encounter (Signed)
Patient called to states that she is having trouble breathing. Patient is not taking her   fluticasone furoate-vilanterol (BREO ELLIPTA) 200-25 MCG/INH AEPB   Due to financial stress. She would like to if PCP could prescribe her steroids  to help with her wheezing and cough or if she would need to come into clinic to be evaluated.  Please advice [phone]  Thank you Emmit Pomfret

## 2017-12-07 NOTE — Telephone Encounter (Signed)
MA unable to reach patient on "text now" line. Patient has not completed Finanacial paperwork to receive the [facility] card to charge medication. Patient will need to report to the ED for treatment since she cant afford to obtain her medications at this time.

## 2017-12-07 NOTE — Telephone Encounter (Signed)
Patient medication or appointment need.

## 2017-12-07 NOTE — Telephone Encounter (Signed)
I have sent an inhaled steroid inhaler and an albuterol inhaler rx to [facility]. Pt should go to ED if she feels that her asthma has become too persistent and too severe.

## 2017-12-07 NOTE — Telephone Encounter (Signed)
Noted  

## 2017-12-15 ENCOUNTER — Other Ambulatory Visit: Payer: Self-pay

## 2017-12-15 ENCOUNTER — Encounter (HOSPITAL_COMMUNITY): Payer: Self-pay

## 2017-12-15 ENCOUNTER — Emergency Department (HOSPITAL_COMMUNITY)
Admission: EM | Admit: 2017-12-15 | Discharge: 2017-12-15 | Disposition: A | Discharge status: Home / Self Care | Payer: Self-pay | Attending: Emergency Medicine | Admitting: Emergency Medicine

## 2017-12-15 ENCOUNTER — Emergency Department (HOSPITAL_COMMUNITY): Payer: Self-pay

## 2017-12-15 DIAGNOSIS — Z87891 Personal history of nicotine dependence: Secondary | ICD-10-CM | POA: Insufficient documentation

## 2017-12-15 DIAGNOSIS — J4541 Moderate persistent asthma with (acute) exacerbation: Secondary | ICD-10-CM | POA: Insufficient documentation

## 2017-12-15 DIAGNOSIS — Z79899 Other long term (current) drug therapy: Secondary | ICD-10-CM | POA: Insufficient documentation

## 2017-12-15 DIAGNOSIS — J189 Pneumonia, unspecified organism: Secondary | ICD-10-CM

## 2017-12-15 DIAGNOSIS — J181 Lobar pneumonia, unspecified organism: Secondary | ICD-10-CM | POA: Insufficient documentation

## 2017-12-15 LAB — BASIC METABOLIC PANEL
Anion gap: 11 (ref 5–15)
BUN: 8 mg/dL (ref 6–20)
CO2: 20 mmol/L — ABNORMAL LOW (ref 22–32)
Calcium: 8.6 mg/dL — ABNORMAL LOW (ref 8.9–10.3)
Chloride: 106 mmol/L (ref 98–111)
Creatinine, Ser: 0.8 mg/dL (ref 0.44–1.00)
GFR calc Af Amer: >60 mL/min (ref >60)
GFR calc non Af Amer: >60 mL/min (ref >60)
Glucose, Bld: 96 mg/dL (ref 70–99)
Potassium: 3.6 mmol/L (ref 3.5–5.1)
Sodium: 137 mmol/L (ref 135–145)

## 2017-12-15 LAB — CBC
HCT: 42.5 % (ref 36.0–46.0)
Hemoglobin: 13.7 g/dL (ref 12.0–15.0)
MCH: 30 pg (ref 26.0–34.0)
MCHC: 32.2 g/dL (ref 30.0–36.0)
MCV: 93.2 fL (ref 80.0–100.0)
NRBC: 0 % (ref 0.0–0.2)
Platelets: 284 10*3/uL (ref 150–400)
RBC: 4.56 MIL/uL (ref 3.87–5.11)
RDW: 13.3 % (ref 11.5–15.5)
WBC: 5.7 10*3/uL (ref 4.0–10.5)

## 2017-12-15 LAB — INFLUENZA PANEL BY PCR (TYPE A & B)
INFLBPCR: NEGATIVE
Influenza A By PCR: NEGATIVE

## 2017-12-15 LAB — I-STAT CHEM 8, ED
BUN: 9 mg/dL (ref 6–20)
Calcium, Ion: 1.11 mmol/L — ABNORMAL LOW (ref 1.15–1.40)
Chloride: 107 mmol/L (ref 98–111)
Creatinine, Ser: 0.6 mg/dL (ref 0.44–1.00)
Glucose, Bld: 93 mg/dL (ref 70–99)
HCT: 43 % (ref 36.0–46.0)
Hemoglobin: 14.6 g/dL (ref 12.0–15.0)
POTASSIUM: 3.5 mmol/L (ref 3.5–5.1)
Sodium: 139 mmol/L (ref 135–145)
TCO2: 23 mmol/L (ref 22–32)

## 2017-12-15 MED ORDER — ALBUTEROL SULFATE (2.5 MG/3ML) 0.083% IN NEBU
5.0000 mg | INHALATION_SOLUTION | Freq: Once | RESPIRATORY_TRACT | Status: AC
  Administered 2017-12-15: 5 mg via RESPIRATORY_TRACT
  Filled 2017-12-15: qty 6

## 2017-12-15 MED ORDER — DOXYCYCLINE HYCLATE 100 MG PO CAPS: 100.0000 mg | ORAL_CAPSULE | Freq: Every day | ORAL | 0 refills | Status: AC

## 2017-12-15 MED ORDER — ALBUTEROL SULFATE HFA 108 (90 BASE) MCG/ACT IN AERS: 1.0000 | INHALATION_SPRAY | Freq: Four times a day (QID) | RESPIRATORY_TRACT | 0 refills | Status: DC | PRN

## 2017-12-15 MED ORDER — IPRATROPIUM-ALBUTEROL 0.5-2.5 (3) MG/3ML IN SOLN
3.0000 mL | Freq: Once | RESPIRATORY_TRACT | Status: AC
  Administered 2017-12-15: 3 mL via RESPIRATORY_TRACT
  Filled 2017-12-15: qty 3

## 2017-12-15 MED ORDER — PREDNISONE 20 MG PO TABS
60.0000 mg | ORAL_TABLET | Freq: Once | ORAL | Status: AC
  Administered 2017-12-15: 60 mg via ORAL
  Filled 2017-12-15: qty 3

## 2017-12-15 NOTE — ED Provider Notes (Signed)
[facility] EMERGENCY DEPARTMENT Provider Note   CSN: [id number] Arrival date & time: 12/15/17  1533     History   Chief Complaint Chief Complaint  Patient presents with  . Asthma  . Shortness of Breath  . Cough    HPI Linda Dalton is a 42 y.o. female with past medical history significant for asthma, chronic sinusitis who presents for evaluation of shortness of breath.  Patient states that her asthma has been worsening over the last 1 to 2 weeks due to weather change.  Patient states she was seen by her PCP and was given a DuoNeb approximately 1-1/2 weeks ago.  Patient states that she has increased wheezing and shortness of breath with a dry hacking cough that has kept her from sleeping.  Patient states she has been taking Singulair, Abreva and be using albuterol nebulizers without relief of symptoms.  Denies fever, chills, rhinorrhea, congestion, chest pain, nausea, vomiting, abdominal pain, hemoptysis.  Admits to nonproductive cough.  Patient admits to multiple hospitalizations most recently in July for asthma exacerbations.  Denies history of prior intubation for asthma exacerbation.  History obtained from patient.  No interpreter was used.  HPI  Past Medical History:  Diagnosis Date  . Asthma   . DVT (deep venous thrombosis) ([place])   . Fibroid tumor   . PONV (postoperative nausea and vomiting)   . Presence of IVC filter   . Sinusitis   . Tonsillitis     Patient Active Problem List   Diagnosis Date Noted  . Acute respiratory failure with hypoxia and hypercapnia ([place]) 07/13/2017  . Asthma exacerbation 04/28/2016  . Moderate persistent asthma with exacerbation 03/05/2016  . Trimalleolar fracture of right ankle 02/02/2014  . DVT (deep venous thrombosis) ([place]) 05/01/2013  . Acute DVT (deep venous thrombosis) ([place]) 05/01/2013  . ASTHMA 02/04/2007  . G E REFLUX 02/04/2007    Past Surgical History:  Procedure Laterality Date  . ABDOMINAL  HYSTERECTOMY    . BREAST SURGERY    . ORIF ANKLE FRACTURE Right 02/02/2014   Procedure: OPEN REDUCTION INTERNAL FIXATION (ORIF) RIGHT TRIMALLEOLAR ANKLE FRACTURE;  Surgeon: Marianna Payment, MD;  Location: [facility];  Service: Orthopedics;  Laterality: Right;  . TUBAL LIGATION       OB History   None      Home Medications    Prior to Admission medications   Medication Sig Start Date End Date Taking? Authorizing Provider  acetaminophen (TYLENOL) 500 MG tablet Take 500-1,000 mg by mouth every 6 (six) hours as needed (for pain or headaches).    [provider]  albuterol (PROVENTIL HFA;VENTOLIN HFA) 108 (90 Base) MCG/ACT inhaler Inhale 1-2 puffs into the lungs every 6 (six) hours as needed for wheezing or shortness of breath. 12/15/17   Kymari Lollis A, PA-C  budesonide-formoterol (SYMBICORT) 160-4.5 MCG/ACT inhaler Inhale 2 puffs into the lungs 2 (two) times daily. 12/07/17   Clent Demark, PA-C  doxycycline (VIBRAMYCIN) 100 MG capsule Take 1 capsule (100 mg total) by mouth daily for 7 days. 12/15/17 12/22/17  Rosalia Mcavoy A, PA-C  fexofenadine (ALLEGRA) 180 MG tablet Take 180 mg by mouth daily.    [provider]  fluticasone (FLONASE) 50 MCG/ACT nasal spray Place 2 sprays into both nostrils daily. 05/14/17   Hedges, Dellis Filbert, PA-C  montelukast (SINGULAIR) 10 MG tablet Take 1 tablet (10 mg total) by mouth daily. 08/11/17   Clent Demark, PA-C  Multiple Vitamins-Calcium (ONE-A-DAY WOMENS PO) Take 1 tablet  by mouth daily.    [provider]    Family History Family History  Problem Relation Age of Onset  . Congestive Heart Failure Mother   . Diabetes Mother   . Asthma Mother   . Hypertension Mother   . Asthma Father   . Sleep apnea Father   . Hypercholesterolemia Father   . Heart murmur Sister   . Heart disease Brother   . Cancer Other   . Congestive Heart Failure Other     Social History Social History   Tobacco Use  . Smoking status:  Former Smoker    Packs/day: 1.00    Years: 8.00    Pack years: 8.00    Types: Cigarettes    Last attempt to quit: 07/14/2014    Years since quitting: 3.4  . Smokeless tobacco: Never Used  Substance Use Topics  . Alcohol use: Yes    Alcohol/week: 0.0 standard drinks    Comment: rare  . Drug use: No     Allergies   Clarithromycin; Codeine; Ibuprofen; Naproxen; Nsaids; and Penicillins   Review of Systems Review of Systems  Constitutional: Negative for chills, diaphoresis, fatigue and fever.  HENT: Negative.   Respiratory: Positive for cough and shortness of breath. Negative for apnea, choking, chest tightness, wheezing and stridor.   Cardiovascular: Negative for chest pain, palpitations and leg swelling.  Gastrointestinal: Negative.   Genitourinary: Negative.   Musculoskeletal: Negative.   Skin: Negative.   Neurological: Negative for dizziness, speech difficulty, weakness, light-headedness and headaches.  All other systems reviewed and are negative.    Physical Exam Updated Vital Signs BP 139/86 (BP Location: Right Arm)   Pulse 74   Temp 98.5 F (36.9 C) (Oral)   Resp 18   Ht 5\' 8"  (1.727 m)   Wt 93.4 kg   LMP 03/15/2013   SpO2 98%   BMI 31.32 kg/m   Physical Exam  Constitutional: She appears well-developed and well-nourished.  Non-toxic appearance. She does not appear ill. No distress.  HENT:  Head: Atraumatic.  Mouth/Throat: Oropharynx is clear and moist.  Eyes: Pupils are equal, round, and reactive to light.  Neck: Normal range of motion.  Cardiovascular: Normal rate, regular rhythm, normal heart sounds and intact distal pulses. Exam reveals no gallop.  No murmur heard. Pulmonary/Chest: Effort normal and breath sounds normal. No accessory muscle usage. No tachypnea. No respiratory distress. She has no decreased breath sounds. She has no rhonchi. She has no rales.  Bilateral diffuse expiratory and inspiratory wheezing.  Able to speak in full sentences without  difficulty.  No decreased breath sounds.  No rales or rhonchi.  Normal respiratory effort.  Abdominal: She exhibits no distension.  Musculoskeletal: Normal range of motion.  Neurological: She is alert.  Skin: Skin is warm and dry.  Psychiatric: She has a normal mood and affect.  Nursing note and vitals reviewed.    ED Treatments / Results  Labs (all labs ordered are listed, but only abnormal results are displayed) Labs Reviewed  BASIC METABOLIC PANEL - Abnormal; Notable for the following components:      Result Value   CO2 20 (*)    Calcium 8.6 (*)    All other components within normal limits  I-STAT CHEM 8, ED - Abnormal; Notable for the following components:   Calcium, Ion 1.11 (*)    All other components within normal limits  CBC  INFLUENZA PANEL BY PCR (TYPE A & B)    EKG None  Radiology Dg  Chest 2 View  Result Date: 12/15/2017 CLINICAL DATA:  Worsening of asthma, increasing shortness of breath, cough EXAM: CHEST - 2 VIEW COMPARISON:  Chest x-ray of 07/13/2017 FINDINGS: Prominent markings are present at the left lung base which could represent atelectasis, but developing pneumonia is a definite consideration. The right lung is clear. No pleural effusion is seen. Mediastinal and hilar contours are unremarkable. The heart is within normal limits in size. No bony abnormality is seen. IMPRESSION: Prominent markings at the left lung base may reflect atelectasis, but developing pneumonia cannot be excluded. Electronically Signed   By: Ivar Drape M.D.   On: 12/15/2017 17:24    Procedures Procedures (including critical care time)  Medications Ordered in ED Medications  albuterol (PROVENTIL) (2.5 MG/3ML) 0.083% nebulizer solution 5 mg (5 mg Nebulization Given 12/15/17 1705)  predniSONE (DELTASONE) tablet 60 mg (60 mg Oral Given 12/15/17 1705)  ipratropium-albuterol (DUONEB) 0.5-2.5 (3) MG/3ML nebulizer solution 3 mL (3 mLs Nebulization Given 12/15/17 1802)     Initial  Impression / Assessment and Plan / ED Course  I have reviewed the triage vital signs and the nursing notes.  Pertinent labs & imaging results that were available during my care of the patient were reviewed by me and considered in my medical decision making (see chart for details).  42 year old female who appears otherwise well presents for evaluation of asthma exacerbation shortness of breath.  Has had worsening of her asthma symptoms over the last 2 weeks with weather changes.  Patient became shortness of yesterday evening.  Has been using her home albuterol nebulizer without relief of symptoms.  Does not have rescue inhaler at home. Admits to multiple hospitalizations for asthma exacerbations in the past most recently July 2019.  Denies history of intubation with asthma exacerbations.  Denies fever, chills, nausea, vomiting, rhinorrhea, congestion, chest pain.  Admits to nonproductive cough.  Afebrile, nonseptic, non-ill-appearing.  She does have diffuse inspiratory and expiratory wheezes.  Able to speak in full sentences without difficulty.  Oxygen saturations 98% on room air with good waveform.  She does not appear in any acute respiratory distress.  She is not tachypneic and has normal respiratory effort.  Heart score low risk, PERC negative.  Will trial steroids and DuoNeb and reevaluate.  1745: On reevaluation patient with improved breath sounds.  She does have mild expiratory wheeze in left lower lobe.  Able to speak in full sentences without difficulty. Will trial Duoneb and reevaluate.   1845: Patient without wheeze. Normal air movement. Will ambulate with pulse ox and reevaluate.  Labs without leukocytosis, metabolic panel without electrolyte abnormality, normal renal and liver function.  Influenza panel negative.  1905: Patient able to ambulate in hall with oxygen saturations 96 to 98% on room air.  Patient had no shortness of breath or dyspnea with exertion.  She did not need to take any  breaks.  Able to speak in full sentences without difficulty.  Oxygen saturation 98% on room air with good waveform.  Requesting DC home at this time.  Chest x-ray with possible atelectasis versus early infiltrate, no pneumothorax, widened mediastinum, pulmonary edema.  Will DC home with antibiotics and have patient follow with PCP for reevaluation.  She is hemodynamically stable and appropriate for DC home at this time.  Discussed strict return precautions.  Patient voiced understanding and is agreeable for follow-up.    Final Clinical Impressions(s) / ED Diagnoses   Final diagnoses:  Moderate persistent asthma with exacerbation  Community acquired pneumonia of  left lower lobe of lung [facility])    ED Discharge Orders         Ordered    albuterol (PROVENTIL HFA;VENTOLIN HFA) 108 (90 Base) MCG/ACT inhaler  Every 6 hours PRN     12/15/17 1911    doxycycline (VIBRAMYCIN) 100 MG capsule  Daily     12/15/17 1911           Artice Bergerson A, PA-C 12/15/17 1921    Maudie Flakes, MD 12/15/17 2022

## 2017-12-15 NOTE — Discharge Instructions (Addendum)
I waited today for asthma exacerbation.  Your chest x-ray did show you may have a possible early developing pneumonia.  I have prescribed antibiotics.  Please take as prescribed.  I have also given you a prescription for albuterol for her asthma as you state you do not have a rescue inhaler at home.  Please follow-up with your PCP for reevaluation.  Return to the ED for any new or worsening symptoms.

## 2017-12-15 NOTE — ED Triage Notes (Signed)
Pt arrives to ER via POV with worsening asthma accompanied with SOB that kept her from sleeping and a dry hacking cough overnight. States she takes abrevo, singulair, and albuterol nebs without relief overnight and this morning. LS insp and exp wheeze throughout, nonprod hacking cough on arrival. Able to converse without taking breaks in sentences.

## 2017-12-15 NOTE — ED Notes (Signed)
Ambulating sats maintained 96-98% RA, no SOB noted, no rest breaks needed.

## 2017-12-15 NOTE — ED Notes (Signed)
Discharge instructions and prescriptions discussed with Pt. Pt verbalized understanding. Pt stable and ambulatory.   

## 2018-01-14 ENCOUNTER — Telehealth (INDEPENDENT_AMBULATORY_CARE_PROVIDER_SITE_OTHER): Payer: Self-pay | Admitting: Physician Assistant

## 2018-01-14 NOTE — Telephone Encounter (Signed)
Patient called to request Saline to be use to dilute her albuterol.  States that she tired to buy some thru her Pharmacy but was told that her PCP needed to send a RX.  Please advice [phone]  Patient uses [facility]

## 2018-01-15 MED ORDER — ALBUTEROL SULFATE (2.5 MG/3ML) 0.083% IN NEBU: 2.5000 mg | INHALATION_SOLUTION | Freq: Four times a day (QID) | RESPIRATORY_TRACT | 1 refills | Status: AC | PRN

## 2018-01-15 NOTE — Telephone Encounter (Signed)
I have switched albuterol to the kind that does not need dilution. Pick up at [facility].

## 2018-01-15 NOTE — Telephone Encounter (Signed)
FWD to PCP. Tempestt S Roberts, CMA  

## 2018-01-15 NOTE — Addendum Note (Signed)
Addended by: Domenica Fail D on: 01/15/2018 01:35 PM   Modules accepted: Orders

## 2018-01-21 NOTE — Telephone Encounter (Signed)
Patient is aware. Linda Dalton, CMA  

## 2018-02-24 ENCOUNTER — Encounter (HOSPITAL_COMMUNITY): Payer: Self-pay

## 2018-02-24 ENCOUNTER — Emergency Department (HOSPITAL_BASED_OUTPATIENT_CLINIC_OR_DEPARTMENT_OTHER)
Admit: 2018-02-24 | Discharge: 2018-02-24 | Disposition: A | Discharge status: Home / Self Care | Payer: BLUE CROSS/BLUE SHIELD | Attending: Emergency Medicine | Admitting: Emergency Medicine

## 2018-02-24 ENCOUNTER — Emergency Department (HOSPITAL_COMMUNITY)
Admission: EM | Admit: 2018-02-24 | Discharge: 2018-02-24 | Disposition: A | Discharge status: Home / Self Care | Payer: BLUE CROSS/BLUE SHIELD | Attending: Emergency Medicine | Admitting: Emergency Medicine

## 2018-02-24 DIAGNOSIS — I82492 Acute embolism and thrombosis of other specified deep vein of left lower extremity: Secondary | ICD-10-CM

## 2018-02-24 DIAGNOSIS — Z87891 Personal history of nicotine dependence: Secondary | ICD-10-CM | POA: Diagnosis not present

## 2018-02-24 DIAGNOSIS — R609 Edema, unspecified: Secondary | ICD-10-CM

## 2018-02-24 DIAGNOSIS — Z79899 Other long term (current) drug therapy: Secondary | ICD-10-CM | POA: Insufficient documentation

## 2018-02-24 DIAGNOSIS — J45909 Unspecified asthma, uncomplicated: Secondary | ICD-10-CM | POA: Diagnosis not present

## 2018-02-24 LAB — BASIC METABOLIC PANEL
Anion gap: 8 (ref 5–15)
BUN: 11 mg/dL (ref 6–20)
CO2: 26 mmol/L (ref 22–32)
Calcium: 8.8 mg/dL — ABNORMAL LOW (ref 8.9–10.3)
Chloride: 104 mmol/L (ref 98–111)
Creatinine, Ser: 0.72 mg/dL (ref 0.44–1.00)
GFR calc Af Amer: >60 mL/min (ref >60)
GFR calc non Af Amer: >60 mL/min (ref >60)
Glucose, Bld: 93 mg/dL (ref 70–99)
Potassium: 3.8 mmol/L (ref 3.5–5.1)
Sodium: 138 mmol/L (ref 135–145)

## 2018-02-24 LAB — CBC WITH DIFFERENTIAL/PLATELET
Abs Immature Granulocytes: 0.03 10*3/uL (ref 0.00–0.07)
Basophils Absolute: 0 10*3/uL (ref 0.0–0.1)
Basophils Relative: 0 %
Eosinophils Absolute: 0.1 10*3/uL (ref 0.0–0.5)
Eosinophils Relative: 2 %
HCT: 42.9 % (ref 36.0–46.0)
Hemoglobin: 13.6 g/dL (ref 12.0–15.0)
Immature Granulocytes: 1 %
Lymphocytes Relative: 41 %
Lymphs Abs: 2.7 10*3/uL (ref 0.7–4.0)
MCH: 29.8 pg (ref 26.0–34.0)
MCHC: 31.7 g/dL (ref 30.0–36.0)
MCV: 94.1 fL (ref 80.0–100.0)
Monocytes Absolute: 0.5 10*3/uL (ref 0.1–1.0)
Monocytes Relative: 7 %
Neutro Abs: 3.2 10*3/uL (ref 1.7–7.7)
Neutrophils Relative %: 49 %
Platelets: 307 10*3/uL (ref 150–400)
RBC: 4.56 MIL/uL (ref 3.87–5.11)
RDW: 13.5 % (ref 11.5–15.5)
WBC: 6.5 10*3/uL (ref 4.0–10.5)
nRBC: 0 % (ref 0.0–0.2)

## 2018-02-24 MED ORDER — RIVAROXABAN 15 MG PO TABS
15.0000 mg | ORAL_TABLET | Freq: Once | ORAL | Status: AC
  Administered 2018-02-24: 15 mg via ORAL
  Filled 2018-02-24: qty 1

## 2018-02-24 MED ORDER — RIVAROXABAN (XARELTO) EDUCATION KIT FOR DVT/PE PATIENTS
PACK | Freq: Once | Status: AC
  Administered 2018-02-24: 12:00:00
  Filled 2018-02-24 (×2): qty 1

## 2018-02-24 MED ORDER — ACETAMINOPHEN 325 MG PO TABS
325.0000 mg | ORAL_TABLET | Freq: Once | ORAL | Status: AC
  Administered 2018-02-24: 325 mg via ORAL
  Filled 2018-02-24: qty 1

## 2018-02-24 MED ORDER — RIVAROXABAN (XARELTO) VTE STARTER PACK (15 & 20 MG): ORAL_TABLET | ORAL | 0 refills | Status: DC

## 2018-02-24 NOTE — ED Notes (Signed)
Patient transported to Ultrasound 

## 2018-02-24 NOTE — ED Provider Notes (Signed)
[facility] EMERGENCY DEPARTMENT Provider Note   CSN: [id number] Arrival date & time: 02/24/18  3818     History   Chief Complaint Chief Complaint  Patient presents with  . left leg pain/aching    HPI Linda Dalton is a 43 y.o. female.  HPI   43 year old female presents today with complaints of swelling to her left lower extremity.  Patient notes a significant past medical history DVTs in the left leg.  She notes 2 DVTs in his left leg, she was evaluated by vascular surgery who placed a stent in the iliac vein.  She has had no difficulty since then.  She notes pain in the posterior calf and proximal thigh.  She does not smoke, does not take birth control, no prolonged immobilization, trauma.  It was thought that her DVTs were secondary to vein compression.  She is not on anticoagulation.  Patient notes she has been suffering from asthma and is followed by pulmonology.  She notes she has had intermittent shortness of breath related to her asthma exacerbations, but after taking prednisone has not had any shortness of breath and does not have any chest pain or shortness of breath presently.    Past Medical History:  Diagnosis Date  . Asthma   . DVT (deep venous thrombosis) ([place])   . Fibroid tumor   . PONV (postoperative nausea and vomiting)   . Presence of IVC filter   . Sinusitis   . Tonsillitis     Patient Active Problem List   Diagnosis Date Noted  . Acute respiratory failure with hypoxia and hypercapnia ([place]) 07/13/2017  . Asthma exacerbation 04/28/2016  . Moderate persistent asthma with exacerbation 03/05/2016  . Trimalleolar fracture of right ankle 02/02/2014  . DVT (deep venous thrombosis) ([place]) 05/01/2013  . Acute DVT (deep venous thrombosis) ([place]) 05/01/2013  . ASTHMA 02/04/2007  . G E REFLUX 02/04/2007    Past Surgical History:  Procedure Laterality Date  . ABDOMINAL HYSTERECTOMY    . BREAST SURGERY    . ORIF ANKLE FRACTURE  Right 02/02/2014   Procedure: OPEN REDUCTION INTERNAL FIXATION (ORIF) RIGHT TRIMALLEOLAR ANKLE FRACTURE;  Surgeon: Marianna Payment, MD;  Location: [facility];  Service: Orthopedics;  Laterality: Right;  . TUBAL LIGATION       OB History   No obstetric history on file.      Home Medications    Prior to Admission medications   Medication Sig Start Date End Date Taking? Authorizing Provider  acetaminophen (TYLENOL) 500 MG tablet Take 500-1,000 mg by mouth every 6 (six) hours as needed (for pain or headaches).    [provider]  albuterol (PROVENTIL) (2.5 MG/3ML) 0.083% nebulizer solution Take 3 mLs (2.5 mg total) by nebulization every 6 (six) hours as needed for wheezing or shortness of breath. 01/15/18   Clent Demark, PA-C  budesonide-formoterol [facility]) 160-4.5 MCG/ACT inhaler Inhale 2 puffs into the lungs 2 (two) times daily. 12/07/17   Clent Demark, PA-C  fexofenadine (ALLEGRA) 180 MG tablet Take 180 mg by mouth daily.    [provider]  fluticasone (FLONASE) 50 MCG/ACT nasal spray Place 2 sprays into both nostrils daily. 05/14/17   Falyn Rubel, Dellis Filbert, PA-C  montelukast (SINGULAIR) 10 MG tablet Take 1 tablet (10 mg total) by mouth daily. 08/11/17   Clent Demark, PA-C  Multiple Vitamins-Calcium (ONE-A-DAY WOMENS PO) Take 1 tablet by mouth daily.    [provider]  Rivaroxaban 15 & 20 MG TBPK Take as  directed on package: Start with one 19m tablet by mouth twice a day with food. On Day 22, switch to one 256mtablet once a day with food. 02/24/18   HeOkey RegalPA-C    Family History Family History  Problem Relation Age of Onset  . Congestive Heart Failure Mother   . Diabetes Mother   . Asthma Mother   . Hypertension Mother   . Asthma Father   . Sleep apnea Father   . Hypercholesterolemia Father   . Heart murmur Sister   . Heart disease Brother   . Cancer Other   . Congestive Heart Failure Other     Social History Social History    Tobacco Use  . Smoking status: Former Smoker    Packs/day: 1.00    Years: 8.00    Pack years: 8.00    Types: Cigarettes    Last attempt to quit: 07/14/2014    Years since quitting: 3.6  . Smokeless tobacco: Never Used  Substance Use Topics  . Alcohol use: Yes    Alcohol/week: 0.0 standard drinks    Comment: rare  . Drug use: No     Allergies   Clarithromycin; Codeine; Ibuprofen; Naproxen; Nsaids; and Penicillins   Review of Systems Review of Systems  All other systems reviewed and are negative.    Physical Exam Updated Vital Signs BP (!) 131/94 (BP Location: Left Arm)   Pulse 81   Temp 98.2 F (36.8 C) (Oral)   Resp 14   LMP 03/15/2013   SpO2 99%   Physical Exam Vitals signs and nursing note reviewed.  Constitutional:      Appearance: She is well-developed.  HENT:     Head: Normocephalic and atraumatic.  Eyes:     General: No scleral icterus.       Right eye: No discharge.        Left eye: No discharge.     Conjunctiva/sclera: Conjunctivae normal.     Pupils: Pupils are equal, round, and reactive to light.  Neck:     Musculoskeletal: Normal range of motion.     Vascular: No JVD.     Trachea: No tracheal deviation.  Cardiovascular:     Rate and Rhythm: Normal rate and regular rhythm.  Pulmonary:     Effort: Pulmonary effort is normal. No respiratory distress.     Breath sounds: Normal breath sounds. No stridor. No wheezing, rhonchi or rales.  Chest:     Chest wall: No tenderness.  Musculoskeletal:     Comments: Minor edema to the left lower extremity with tenderness along the calf, no overlying skin changes no chronic changes of the skin- pedal pulse 2 plus   Neurological:     Mental Status: She is alert and oriented to person, place, and time.     Coordination: Coordination normal.  Psychiatric:        Behavior: Behavior normal.        Thought Content: Thought content normal.        Judgment: Judgment normal.      ED Treatments / Results   Labs (all labs ordered are listed, but only abnormal results are displayed) Labs Reviewed  BASIC METABOLIC PANEL - Abnormal; Notable for the following components:      Result Value   Calcium 8.8 (*)    All other components within normal limits  CBC WITH DIFFERENTIAL/PLATELET    EKG None  Radiology Vas [place] Extremity Venous (dvt) (only [facility])  Result Date:  02/24/2018  Lower Venous Study Indications: Edema.  Performing Technologist: June Leap RDMS, RVT  Examination Guidelines: A complete evaluation includes B-mode imaging, spectral Doppler, color Doppler, and power Doppler as needed of all accessible portions of each vessel. Bilateral testing is considered an integral part of a complete examination. Limited examinations for reoccurring indications may be performed as noted.  Right Venous Findings: +---+---------------+---------+-----------+----------+-------+    CompressibilityPhasicitySpontaneityPropertiesSummary +---+---------------+---------+-----------+----------+-------+ CFVFull           Yes      Yes                          +---+---------------+---------+-----------+----------+-------+  Left Venous Findings: +---------+---------------+---------+-----------+----------+-------+          CompressibilityPhasicitySpontaneityPropertiesSummary +---------+---------------+---------+-----------+----------+-------+ CFV      Full           Yes      Yes                          +---------+---------------+---------+-----------+----------+-------+ SFJ      Full                                                 +---------+---------------+---------+-----------+----------+-------+ FV Prox  Full                                                 +---------+---------------+---------+-----------+----------+-------+ FV Mid   Full                                                 +---------+---------------+---------+-----------+----------+-------+ FV DistalPartial                                       Chronic +---------+---------------+---------+-----------+----------+-------+ PFV      Full                                                 +---------+---------------+---------+-----------+----------+-------+ POP      Partial        Yes      Yes                  Chronic +---------+---------------+---------+-----------+----------+-------+ PTV      Full                                                 +---------+---------------+---------+-----------+----------+-------+ PERO     Full                                                 +---------+---------------+---------+-----------+----------+-------+    Summary: Right: No evidence of  common femoral vein obstruction. Left: Findings consistent with chronic deep vein thrombosis involving the left femoral vein, and left popliteal vein. No cystic structure found in the popliteal fossa. No evidence of acute DVT.  *See table(s) above for measurements and observations.    Preliminary     Procedures Procedures (including critical care time)  Medications Ordered in ED Medications  acetaminophen (TYLENOL) tablet 325 mg (325 mg Oral Given 02/24/18 1134)  rivaroxaban (XARELTO) Education Kit for DVT/PE patients ( Does not apply Given 02/24/18 1214)  Rivaroxaban (XARELTO) tablet 15 mg (15 mg Oral Given 02/24/18 1214)     Initial Impression / Assessment and Plan / ED Course  I have reviewed the triage vital signs and the nursing notes.  Pertinent labs & imaging results that were available during my care of the patient were reviewed by me and considered in my medical decision making (see chart for details).     Labs: CBC, BMP  Imaging: Vascular ultrasound lower extremity  Consults:  Therapeutics:  Discharge Meds:   Assessment/Plan: 43 year old female presents today with complaints of leg swelling.  Patient's ultrasound shows chronic DVTs, her physical exam shows swelling which looks more acute.   Given patient's presentation vascular surgery was consulted, their recommendation was for Xarelto and outpatient follow-up.  I find this completely reasonable in this otherwise healthy patient.  She has no signs of pulmonary embolism she will follow-up with her primary care provider for ongoing evaluation and management and work-up for DVT causes.  Patient given strict return precautions, she verbalized understanding and agreement to today's plan had no further questions or concerns.    Final Clinical Impressions(s) / ED Diagnoses   Final diagnoses:  Deep vein thrombosis (DVT) of other vein of left lower extremity, unspecified chronicity [facility])    ED Discharge Orders         Ordered    Rivaroxaban 15 & 20 MG TBPK     02/24/18 1146           Okey Regal, PA-C 02/24/18 1216    Isla Pence, MD 02/24/18 1221

## 2018-02-24 NOTE — Discharge Instructions (Addendum)
Please read attached information. If you experience any new or worsening signs or symptoms please return to the emergency room for evaluation. Please follow-up with your primary care provider or specialist as discussed. Please use medication prescribed only as directed and discontinue taking if you have any concerning signs or symptoms.   °

## 2018-02-24 NOTE — Progress Notes (Signed)
LLE venous duplex       has been completed. Preliminary results can be found under CV proc through chart review. Marek Nghiem, BS, RDMS, RVT    

## 2018-02-24 NOTE — ED Triage Notes (Signed)
Patient complains of left leg pain from hip to ankle that started last night. Reports that she has bruise to same with no trauma. Patient reports remote history of DVT and no longer takes blood thinner. No SOB, no CP

## 2018-03-15 ENCOUNTER — Other Ambulatory Visit: Payer: Self-pay | Admitting: Physician Assistant

## 2018-03-15 ENCOUNTER — Other Ambulatory Visit: Payer: Self-pay | Admitting: Primary Care

## 2018-03-15 DIAGNOSIS — Z1231 Encounter for screening mammogram for malignant neoplasm of breast: Secondary | ICD-10-CM

## 2018-03-18 ENCOUNTER — Other Ambulatory Visit: Payer: Self-pay

## 2018-03-18 ENCOUNTER — Encounter (INDEPENDENT_AMBULATORY_CARE_PROVIDER_SITE_OTHER): Payer: Self-pay | Admitting: Primary Care

## 2018-03-18 ENCOUNTER — Ambulatory Visit (INDEPENDENT_AMBULATORY_CARE_PROVIDER_SITE_OTHER): Payer: BLUE CROSS/BLUE SHIELD | Admitting: Primary Care

## 2018-03-18 VITALS — BP 119/84 | HR 65 | Temp 98.0°F | Ht 68.0 in | Wt 212.8 lb

## 2018-03-18 DIAGNOSIS — I82401 Acute embolism and thrombosis of unspecified deep veins of right lower extremity: Secondary | ICD-10-CM | POA: Diagnosis not present

## 2018-03-18 DIAGNOSIS — Z23 Encounter for immunization: Secondary | ICD-10-CM | POA: Diagnosis not present

## 2018-03-18 DIAGNOSIS — J4541 Moderate persistent asthma with (acute) exacerbation: Secondary | ICD-10-CM

## 2018-03-18 DIAGNOSIS — Z09 Encounter for follow-up examination after completed treatment for conditions other than malignant neoplasm: Secondary | ICD-10-CM

## 2018-03-18 DIAGNOSIS — B009 Herpesviral infection, unspecified: Secondary | ICD-10-CM

## 2018-03-18 MED ORDER — VALACYCLOVIR HCL 1 G PO TABS: 1000.0000 mg | ORAL_TABLET | Freq: Two times a day (BID) | ORAL | 0 refills | Status: DC

## 2018-03-18 MED ORDER — RIVAROXABAN 20 MG PO TABS: 20.0000 mg | ORAL_TABLET | Freq: Every day | ORAL | Status: DC

## 2018-03-18 NOTE — Progress Notes (Signed)
Established Patient Office Visit  Subjective:  Patient ID: Linda Dalton, female    DOB: 1975-08-31  Age: 43 y.o. MRN: [id number]  CC:  Chief Complaint  Patient presents with  . Hospitalization Follow-up    DVT    HPI Linda Dalton presents for hospital follow up. PMH of DVT  significant past medical history DVTs in the left leg and asthma followed by pulmonology. Recently in ED and a DVT in upper and lower. Past medical history GERD and Asthma.  Past Medical History:  Diagnosis Date  . Asthma   . DVT (deep venous thrombosis) ([place])   . Fibroid tumor   . PONV (postoperative nausea and vomiting)   . Presence of IVC filter   . Sinusitis   . Tonsillitis     Past Surgical History:  Procedure Laterality Date  . ABDOMINAL HYSTERECTOMY    . BREAST SURGERY    . ORIF ANKLE FRACTURE Right 02/02/2014   Procedure: OPEN REDUCTION INTERNAL FIXATION (ORIF) RIGHT TRIMALLEOLAR ANKLE FRACTURE;  Surgeon: Marianna Payment, MD;  Location: [facility];  Service: Orthopedics;  Laterality: Right;  . TUBAL LIGATION      Family History  Problem Relation Age of Onset  . Congestive Heart Failure Mother   . Diabetes Mother   . Asthma Mother   . Hypertension Mother   . Asthma Father   . Sleep apnea Father   . Hypercholesterolemia Father   . Heart murmur Sister   . Heart disease Brother   . Cancer Other   . Congestive Heart Failure Other     Social History   Socioeconomic History  . Marital status: Married    Spouse name: Not on file  . Number of children: Not on file  . Years of education: Not on file  . Highest education level: Not on file  Occupational History  . Not on file  Social Needs  . Financial resource strain: Not on file  . Food insecurity:    Worry: Not on file    Inability: Not on file  . Transportation needs:    Medical: Not on file    Non-medical: Not on file  Tobacco Use  . Smoking status: Former Smoker    Packs/day: 1.00    Years: 8.00   Pack years: 8.00    Types: Cigarettes    Last attempt to quit: 07/14/2014    Years since quitting: 3.6  . Smokeless tobacco: Never Used  Substance and Sexual Activity  . Alcohol use: Yes    Alcohol/week: 0.0 standard drinks    Comment: rare  . Drug use: No  . Sexual activity: Yes    Birth control/protection: Surgical, Other-see comments  Lifestyle  . Physical activity:    Days per week: Not on file    Minutes per session: Not on file  . Stress: Not on file  Relationships  . Social connections:    Talks on phone: Not on file    Gets together: Not on file    Attends religious service: Not on file    Active member of club or organization: Not on file    Attends meetings of clubs or organizations: Not on file    Relationship status: Not on file  . Intimate partner violence:    Fear of current or ex partner: Not on file    Emotionally abused: Not on file    Physically abused: Not on file    Forced sexual activity: Not on file  Other Topics Concern  .  Not on file  Social History Narrative  . Not on file    Outpatient Medications Prior to Visit  Medication Sig Dispense Refill  . acetaminophen (TYLENOL) 500 MG tablet Take 500-1,000 mg by mouth every 6 (six) hours as needed (for pain or headaches).    Marland Kitchen albuterol (PROVENTIL) (2.5 MG/3ML) 0.083% nebulizer solution Take 3 mLs (2.5 mg total) by nebulization every 6 (six) hours as needed for wheezing or shortness of breath. 150 mL 1  . budesonide-formoterol (SYMBICORT) 160-4.5 MCG/ACT inhaler Inhale 2 puffs into the lungs 2 (two) times daily. 1 Inhaler 3  . Dupilumab 300 MG/2ML SOSY Inject into the skin.    . fexofenadine (ALLEGRA) 180 MG tablet Take 180 mg by mouth daily.    . fluticasone (FLONASE) 50 MCG/ACT nasal spray Place 2 sprays into both nostrils daily. 15.8 g 0  . montelukast (SINGULAIR) 10 MG tablet Take 1 tablet (10 mg total) by mouth daily. 30 tablet 11  . Rivaroxaban 15 & 20 MG TBPK Take as directed on package: Start with  one 15mg  tablet by mouth twice a day with food. On Day 22, switch to one 20mg  tablet once a day with food. 51 each 0  . Spacer/Aero-Holding Chambers (AEROCHAMBER W/FLOWSIGNAL) inhaler Use as directed with all inhalers.    . Multiple Vitamins-Calcium (ONE-A-DAY WOMENS PO) Take 1 tablet by mouth daily.     No facility-administered medications prior to visit.     Allergies  Allergen Reactions  . Clarithromycin Anaphylaxis, Hives, Shortness Of Breath, Swelling and Other (See Comments)    Mouth swells  . Codeine Hives, Rash and Swelling  . Ibuprofen Anaphylaxis and Shortness Of Breath  . Naproxen Anaphylaxis and Nausea And Vomiting  . Nsaids Anaphylaxis  . Penicillins Swelling and Rash    Has patient had a PCN reaction causing immediate rash, facial/tongue/throat swelling, SOB or lightheadedness with hypotension: Yes Has patient had a PCN reaction causing severe rash involving mucus membranes or skin necrosis: Unk Has patient had a PCN reaction that required hospitalization: Yes Has patient had a PCN reaction occurring within the last 10 years: Yes If all of the above answers are "NO", then may proceed with Cephalosporin use.     ROS Review of Systems  Constitutional: Negative.   HENT: Negative.   Eyes: Negative.   Respiratory: Negative.   Cardiovascular: Negative.   Gastrointestinal: Negative.   Endocrine: Negative.   Genitourinary: Negative.   Musculoskeletal: Negative.   Allergic/Immunologic: Negative.   Neurological: Negative.   Hematological: Negative.   Psychiatric/Behavioral: Negative.       Objective:    Physical Exam  Constitutional: She is oriented to person, place, and time. She appears well-developed and well-nourished.  HENT:  Head: Normocephalic.  Eyes: EOM are normal.  Neck: Normal range of motion. Neck supple.  Cardiovascular: Normal rate and regular rhythm.  Pulmonary/Chest: Effort normal and breath sounds normal.  Abdominal: Soft. Bowel sounds are  normal. She exhibits distension.  Musculoskeletal: Normal range of motion.        General: Edema present.     Comments: Left leg swollen and bruised  Neurological: She is alert and oriented to person, place, and time.  Skin: Skin is warm.  Psychiatric: She has a normal mood and affect.    BP 119/84 (BP Location: Left Arm, Patient Position: Sitting, Cuff Size: Normal)   Pulse 65   Temp 98 F (36.7 C) (Oral)   Ht 5\' 8"  (1.727 m)   Wt 212 lb 12.8 oz (  96.5 kg)   LMP 03/15/2013   SpO2 100%   BMI 32.36 kg/m  Wt Readings from Last 3 Encounters:  03/18/18 212 lb 12.8 oz (96.5 kg)  12/15/17 206 lb (93.4 kg)  09/22/17 208 lb (94.3 kg)     Health Maintenance Due  Topic Date Due  . TETANUS/TDAP  12/13/1995    There are no preventive care reminders to display for this patient.  No results found for: TSH Lab Results  Component Value Date   WBC 6.5 02/24/2018   HGB 13.6 02/24/2018   HCT 42.9 02/24/2018   MCV 94.1 02/24/2018   PLT 307 02/24/2018   Lab Results  Component Value Date   NA 138 02/24/2018   K 3.8 02/24/2018   CO2 26 02/24/2018   GLUCOSE 93 02/24/2018   BUN 11 02/24/2018   CREATININE 0.72 02/24/2018   BILITOT 0.6 09/22/2017   ALKPHOS 92 09/22/2017   AST 20 09/22/2017   ALT 28 09/22/2017   PROT 7.4 09/22/2017   ALBUMIN 4.4 09/22/2017   CALCIUM 8.8 (L) 02/24/2018   ANIONGAP 8 02/24/2018   Lab Results  Component Value Date   CHOL 158 09/22/2017   Lab Results  Component Value Date   HDL 44 09/22/2017   Lab Results  Component Value Date   LDLCALC 94 09/22/2017   Lab Results  Component Value Date   TRIG 102 09/22/2017   Lab Results  Component Value Date   CHOLHDL 3.6 09/22/2017   No results found for: HGBA1C    Assessment & Plan:   Problem List Items Addressed This Visit    DVT (deep venous thrombosis) ([place])   Relevant Orders   Ambulatory referral to Hematology / Oncology   Moderate persistent asthma with exacerbation    Other Visit  Diagnoses    Need for Tdap vaccination    -  Primary   Relevant Orders   Tdap vaccine greater than or equal to 7yo IM   Follow-up examination       HSV (herpes simplex virus) infection        Linda Dalton was seen today for hospitalization follow-up.  Diagnoses and all orders for this visit:  Need for Tdap vaccination -     Tdap vaccine greater than or equal to 7yo IM  Follow-up examination  Moderate persistent asthma with exacerbation  Acute deep vein thrombosis (DVT) of right lower extremity, unspecified vein ([place]) -     Ambulatory referral to Hematology / Oncology  HSV (herpes simplex virus) infection    No orders of the defined types were placed in this encounter.   Follow-up: Return in about 3 months (around 06/18/2018), or if symptoms worsen or fail to improve.    Kerin Perna, NP

## 2018-03-21 LAB — HERPES SIMPLEX VIRUS CULTURE

## 2018-03-23 ENCOUNTER — Other Ambulatory Visit (INDEPENDENT_AMBULATORY_CARE_PROVIDER_SITE_OTHER): Payer: Self-pay | Admitting: Primary Care

## 2018-03-23 ENCOUNTER — Telehealth (INDEPENDENT_AMBULATORY_CARE_PROVIDER_SITE_OTHER): Payer: Self-pay | Admitting: Primary Care

## 2018-03-23 ENCOUNTER — Telehealth (INDEPENDENT_AMBULATORY_CARE_PROVIDER_SITE_OTHER): Payer: Self-pay

## 2018-03-23 MED ORDER — RIVAROXABAN 20 MG PO TABS: 20.0000 mg | ORAL_TABLET | Freq: Every day | ORAL | 3 refills | Status: DC

## 2018-03-23 NOTE — Telephone Encounter (Signed)
Patient called to request medication refill for rivaroxaban Alveda Reasons) tablet 20 mg    Patient uses [facility]  Please advise [phone]  Thank you Emmit Pomfret

## 2018-03-23 NOTE — Telephone Encounter (Signed)
Patient Is aware that culture revealed no HSV present. She is also aware that her Xarelto has been sent to [facility]. Nat Christen, CMA

## 2018-03-23 NOTE — Telephone Encounter (Signed)
FWD to PCP. Aritha Huckeba S Nate Common, CMA  

## 2018-03-23 NOTE — Telephone Encounter (Signed)
-----   Message from Kerin Perna, NP sent at 03/22/2018  8:43 AM EDT ----- Let Pt know no HSV present

## 2018-03-29 ENCOUNTER — Telehealth (INDEPENDENT_AMBULATORY_CARE_PROVIDER_SITE_OTHER): Payer: Self-pay | Admitting: Primary Care

## 2018-03-29 NOTE — Telephone Encounter (Signed)
Patient called in regards to her referral that was sent on march 5th. Patient states she has not heard anything in regards to the referral. Patient also stated that the swelling in her legs has not progressed and is getting worse.  Please advise [phone]  Thank you Emmit Pomfret

## 2018-03-29 NOTE — Telephone Encounter (Signed)
Patient needs to be advised to go to ED hx of DVT's

## 2018-03-29 NOTE — Telephone Encounter (Signed)
FWD to PCP. Tempestt S Roberts, CMA  

## 2018-03-30 ENCOUNTER — Telehealth: Payer: Self-pay | Admitting: Internal Medicine

## 2018-03-30 NOTE — Telephone Encounter (Signed)
A new hem apt has been scheduled for the pt to see Dr. Walden Field on 3/25 at 850am. Pt aware to arrive 15 minutes early.

## 2018-03-31 NOTE — Telephone Encounter (Signed)
Patient was advised to return to ED on day of call. Nat Christen, CMA

## 2018-04-07 ENCOUNTER — Encounter: Payer: Self-pay | Admitting: Internal Medicine

## 2018-04-07 ENCOUNTER — Inpatient Hospital Stay: Payer: BLUE CROSS/BLUE SHIELD | Attending: Internal Medicine | Admitting: Internal Medicine

## 2018-04-07 ENCOUNTER — Other Ambulatory Visit: Payer: Self-pay

## 2018-04-07 ENCOUNTER — Inpatient Hospital Stay: Payer: BLUE CROSS/BLUE SHIELD

## 2018-04-07 VITALS — BP 122/86 | HR 78 | Temp 97.7°F | Resp 18 | Ht 68.0 in | Wt 212.7 lb

## 2018-04-07 DIAGNOSIS — Z87891 Personal history of nicotine dependence: Secondary | ICD-10-CM

## 2018-04-07 DIAGNOSIS — J45909 Unspecified asthma, uncomplicated: Secondary | ICD-10-CM

## 2018-04-07 DIAGNOSIS — I82512 Chronic embolism and thrombosis of left femoral vein: Secondary | ICD-10-CM | POA: Insufficient documentation

## 2018-04-07 DIAGNOSIS — I82532 Chronic embolism and thrombosis of left popliteal vein: Secondary | ICD-10-CM

## 2018-04-07 DIAGNOSIS — D259 Leiomyoma of uterus, unspecified: Secondary | ICD-10-CM | POA: Diagnosis not present

## 2018-04-07 DIAGNOSIS — R634 Abnormal weight loss: Secondary | ICD-10-CM

## 2018-04-07 DIAGNOSIS — Z800 Family history of malignant neoplasm of digestive organs: Secondary | ICD-10-CM | POA: Diagnosis not present

## 2018-04-07 DIAGNOSIS — R0602 Shortness of breath: Secondary | ICD-10-CM

## 2018-04-07 DIAGNOSIS — M25552 Pain in left hip: Secondary | ICD-10-CM

## 2018-04-07 DIAGNOSIS — Z8042 Family history of malignant neoplasm of prostate: Secondary | ICD-10-CM | POA: Diagnosis not present

## 2018-04-07 DIAGNOSIS — I82592 Chronic embolism and thrombosis of other specified deep vein of left lower extremity: Secondary | ICD-10-CM

## 2018-04-07 DIAGNOSIS — Z90710 Acquired absence of both cervix and uterus: Secondary | ICD-10-CM

## 2018-04-07 DIAGNOSIS — Z803 Family history of malignant neoplasm of breast: Secondary | ICD-10-CM | POA: Diagnosis not present

## 2018-04-07 LAB — CBC WITH DIFFERENTIAL (CANCER CENTER ONLY)
Abs Immature Granulocytes: 0.01 10*3/uL (ref 0.00–0.07)
Basophils Absolute: 0.1 10*3/uL (ref 0.0–0.1)
Basophils Relative: 1 %
Eosinophils Absolute: 1.4 10*3/uL — ABNORMAL HIGH (ref 0.0–0.5)
Eosinophils Relative: 22 %
HCT: 41.8 % (ref 36.0–46.0)
Hemoglobin: 13.7 g/dL (ref 12.0–15.0)
Immature Granulocytes: 0 %
LYMPHS PCT: 35 %
Lymphs Abs: 2.2 10*3/uL (ref 0.7–4.0)
MCH: 30.8 pg (ref 26.0–34.0)
MCHC: 32.8 g/dL (ref 30.0–36.0)
MCV: 93.9 fL (ref 80.0–100.0)
Monocytes Absolute: 0.5 10*3/uL (ref 0.1–1.0)
Monocytes Relative: 8 %
Neutro Abs: 2.2 10*3/uL (ref 1.7–7.7)
Neutrophils Relative %: 34 %
Platelet Count: 249 10*3/uL (ref 150–400)
RBC: 4.45 MIL/uL (ref 3.87–5.11)
RDW: 14 % (ref 11.5–15.5)
WBC: 6.4 10*3/uL (ref 4.0–10.5)
nRBC: 0 % (ref 0.0–0.2)

## 2018-04-07 LAB — CMP (CANCER CENTER ONLY)
ALT: 23 U/L (ref 0–44)
AST: 23 U/L (ref 15–41)
Albumin: 3.6 g/dL (ref 3.5–5.0)
Alkaline Phosphatase: 94 U/L (ref 38–126)
Anion gap: 7 (ref 5–15)
BUN: 11 mg/dL (ref 6–20)
CHLORIDE: 106 mmol/L (ref 98–111)
CO2: 26 mmol/L (ref 22–32)
Calcium: 8.5 mg/dL — ABNORMAL LOW (ref 8.9–10.3)
Creatinine: 0.82 mg/dL (ref 0.44–1.00)
GFR, Est AFR Am: >60 mL/min (ref >60)
GFR, Estimated: >60 mL/min (ref >60)
Glucose, Bld: 86 mg/dL (ref 70–99)
Potassium: 4 mmol/L (ref 3.5–5.1)
Sodium: 139 mmol/L (ref 135–145)
Total Bilirubin: 0.6 mg/dL (ref 0.3–1.2)
Total Protein: 7.7 g/dL (ref 6.5–8.1)

## 2018-04-07 LAB — C-REACTIVE PROTEIN: CRP: 1 mg/dL — ABNORMAL HIGH (ref <1.0)

## 2018-04-07 LAB — LACTATE DEHYDROGENASE: LDH: 258 U/L — AB (ref 98–192)

## 2018-04-07 NOTE — Progress Notes (Signed)
Referring Physician:  Kerin Perna, NP of [facility].   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 ([place]) - Plan: CBC with Differential (Cancer Center Only), CMP ([place] only), Lactate dehydrogenase (LDH), SPEP with reflex to IFE, Lupus anticoagulant panel*, Beta-2-glycoprotein i abs, IgG/M/A, Factor 5 Leiden*, Prothrombin gene mutation*, ANA, IFA (with reflex), Rheumatoid factor, C-reactive protein  Shortness of breath - Plan: CBC with Differential (Cancer Center Only), CMP ([place] only), Lactate dehydrogenase (LDH), SPEP with reflex to IFE, Lupus anticoagulant panel*, Beta-2-glycoprotein i abs, IgG/M/A, Factor 5 Leiden*, Prothrombin gene mutation*, ANA, IFA (with reflex), Rheumatoid factor, C-reactive protein, CT CHEST W CONTRAST  Abnormal weight loss - Plan: CBC with Differential (Cancer Center Only), CMP ([place] only), Lactate dehydrogenase (LDH), SPEP with reflex to IFE, Lupus anticoagulant panel*, Beta-2-glycoprotein i abs, IgG/M/A, Factor 5 Leiden*, Prothrombin gene mutation*, ANA, IFA (with reflex), Rheumatoid factor, C-reactive protein, CT ABDOMEN PELVIS W CONTRAST  Pain in joint involving left pelvic region and thigh - Plan: CBC with Differential ([place] Only), CMP ([place] only), Lactate dehydrogenase (LDH), SPEP with reflex to IFE, Lupus anticoagulant panel*, Beta-2-glycoprotein i abs, IgG/M/A, Factor 5 Leiden*, Prothrombin gene mutation*, ANA, IFA (with reflex), Rheumatoid factor, C-reactive protein  Staging Cancer Staging No matching staging information was found for the patient.  Assessment and Plan:  1.  Left LE DVT.   43 year old female referred for evaluation due to left LE DVT.  Patient had a history of Left LE DVTs in 2015.  She reports she was on a trial at [facility] using birth control for fibroids.  She subsequently was recommended for hysterectomy.  She reports not long  after the surgery she was at home and developed some darkening of her left lower extremity and pain.  She was seen by Dr. Laurence Ferrari and underwent stent placement with thrombolysis.  She reports she took anticoagulation for approximately 4 months after the procedure.  On subsequent evaluation by Dr. Laurence Ferrari she reports the clots had resolved.  She reports she was also seen 1 year after thrombolysis and was told she had no evidence of thrombosis.  She denies any family history of blood clots.  She has a family history of breast cancer in her aunts diagnosed after the age of 53 colon cancer in a grandfather and prostate cancer.         Patient reports she stopped anticoagulation on her own.  She reports pain is not improved since she started Xarelto.  She has problems with asthma.   Patient is seen today for consultation due to chronic left lower extremity DVT                                    Review of chart shows patient had an ultrasound done 05/01/2013 that showed Summary:  - Findings consistent with acute deep vein thrombosis involving the posterior tibial, popliteal, femoral, and common femoral veins of theleft lower extremity. No propagation noted to the right lower extremity. - No evidence of Baker's cyst on the left.  She had follow-up ultrasound done 08/10/2013 that showed IMPRESSION: 1. No evidence of deep venous thrombosis. 2. Excellent respiratory phasicity in the common femoral vein suggests patency of the iliac vein stent. 3. Their may be trace residual recanalized chronic DVT in the popliteal vein.  Patient had a CT angio chest done in  April 2018 that was negative for PE.  Patient reports she recently developed pain in the lower extremity.  Reports her job requires significant standing.  She underwent bilateral lower extremity Doppler 02/24/2018 with results showing Summary: Right: No evidence of common femoral vein obstruction. Left: Findings consistent with chronic  deep vein thrombosis involving the left femoral vein, and left popliteal vein. No cystic structure found in the popliteal fossa. No evidence of acute DVT.  Labs done today 04/07/2018 reviewed and showed WBC 6.4 HB 13.7 plts 249,000.  Chemistries WNL with K+ 4 Cr 0.82 and normal LFTs.    Long talk held with the patient today.  I have discussed with her possible etiologies for DVT such as surgery, immobility, frequent travel, malignancy, family history.  She described immobility after uterine fibroid surgery which is a risk factor for thrombosis.  Recent symptoms led to repeat USN evaluation done 02/2018 that showed chronic DVT.  I will discuss the case with Dr. Laurence Ferrari to determine if she has any other options other than ongoing anticoagulation.  She will undergo a hypercoagulable evaluation today with blood work.  She will be set up for CT chest, abdomen and pelvis for further evaluation due to her history of chronic thrombosis.  The patient will undergo repeat Doppler evaluation of the lower extremities in 08/2018.  She should continue Xarelto 20 mg po daily as directed.  Pt will RTC in 2-3 weeks to go over results.    2.  Family history of breast cancer.  Pt reports this occurred in Aunts over the age of 29.  Pt has reportedly undergone mammogram evaluation in the past.  Mammogram screening as recommended.    3.  Asthma.  Pulse ox is 100% on room air.  Follow-up with PCP or pulmonary as directed.   4.  Uterine fibroid.  Pt had hysterectomy in 2015 and reported she was primarily bed bound after surgery.    5.  LE pain.  Pt advised to use tylenol or ES tylenol.  I discussed with her it will take time for improvement in pain with anticoagulation as she was only recently diagnosed with DVT in 02/2018.    6.  Health maintenance.  Mammogram screenings as recommended.    40 minutes spent with more than 50% spent in review of records, counseling and coordination of care.    HPI:  43 year old female  referred for evaluation due to DVT.  Patient had a history of DVTs in 2015.  She reports she was on a trial at [facility] using birth control for fibroids.  She subsequently was recommended for hysterectomy.  She reports not long after the surgery she was at home and developed some darkening of her left lower extremity and pain.  She was seen by Dr. Laurence Ferrari and underwent stent placement with thrombolysis.  She reports she took anticoagulation for approximately 4 months after the procedure.  On subsequent evaluation by Dr. Laurence Ferrari she reports the clots had resolved.  She reports she was also seen 1 year after thrombolysis and was told she had no evidence of thrombosis.  She denies any family history of blood clots.  She has a family history of breast cancer in her aunts diagnosed after the age of 48 colon cancer in a grandfather and prostate cancer.         Patient reports she stopped anticoagulation on her own.  She reports pain is not improved since she started Xarelto.  She has problems with asthma.  Patient is seen today for consultation due to chronic left lower extremity DVT                                      Problem List Patient Active Problem List   Diagnosis Date Noted  . Acute respiratory failure with hypoxia and hypercapnia (HCC) [J96.01, J96.02] 07/13/2017  . Asthma exacerbation [J45.901] 04/28/2016  . Moderate persistent asthma with exacerbation [J45.41] 03/05/2016  . Trimalleolar fracture of right ankle [S82.851A] 02/02/2014  . DVT (deep venous thrombosis) ([place]) [I82.409] 05/01/2013  . Acute DVT (deep venous thrombosis) ([place]) [I82.409] 05/01/2013  . ASTHMA [J45.909] 02/04/2007  . G E REFLUX [K21.9] 02/04/2007    Past Medical History Past Medical History:  Diagnosis Date  . Asthma   . DVT (deep venous thrombosis) ([place])   . Fibroid tumor   . PONV (postoperative nausea and vomiting)   . Presence of IVC filter   . Sinusitis   . Tonsillitis     Past Surgical  History Past Surgical History:  Procedure Laterality Date  . ABDOMINAL HYSTERECTOMY    . BREAST SURGERY    . ORIF ANKLE FRACTURE Right 02/02/2014   Procedure: OPEN REDUCTION INTERNAL FIXATION (ORIF) RIGHT TRIMALLEOLAR ANKLE FRACTURE;  Surgeon: Marianna Payment, MD;  Location: [facility];  Service: Orthopedics;  Laterality: Right;  . TUBAL LIGATION      Family History Family History  Problem Relation Age of Onset  . Congestive Heart Failure Mother   . Diabetes Mother   . Asthma Mother   . Hypertension Mother   . Asthma Father   . Sleep apnea Father   . Hypercholesterolemia Father   . Heart murmur Sister   . Heart disease Brother   . Cancer Other   . Congestive Heart Failure Other      Social History  reports that she quit smoking about 3 years ago. Her smoking use included cigarettes. She has a 8.00 pack-year smoking history. She has never used smokeless tobacco. She reports previous alcohol use. She reports that she does not use drugs.  Medications  Current Outpatient Medications:  .  acetaminophen (TYLENOL) 500 MG tablet, Take 500-1,000 mg by mouth every 6 (six) hours as needed (for pain or headaches)., Disp: , Rfl:  .  albuterol (PROVENTIL HFA;VENTOLIN HFA) 108 (90 Base) MCG/ACT inhaler, Inhale 2 puffs into the lungs every 6 (six) hours as needed for wheezing or shortness of breath., Disp: , Rfl:  .  azithromycin (ZITHROMAX) 250 MG tablet, , Disp: , Rfl:  .  budesonide-formoterol (SYMBICORT) 160-4.5 MCG/ACT inhaler, Inhale 2 puffs into the lungs 2 (two) times daily., Disp: 1 Inhaler, Rfl: 3 .  Dupilumab 300 MG/2ML SOSY, Inject into the skin., Disp: , Rfl:  .  fexofenadine (ALLEGRA) 180 MG tablet, Take 180 mg by mouth daily., Disp: , Rfl:  .  fluticasone (FLONASE) 50 MCG/ACT nasal spray, Place 2 sprays into both nostrils daily., Disp: 15.8 g, Rfl: 0 .  montelukast (SINGULAIR) 10 MG tablet, Take 1 tablet (10 mg total) by mouth daily., Disp: 30 tablet, Rfl: 11 .  Multiple  Vitamins-Calcium (ONE-A-DAY WOMENS PO), Take 1 tablet by mouth daily., Disp: , Rfl:  .  rivaroxaban (XARELTO) 20 MG TABS tablet, Take 1 tablet (20 mg total) by mouth daily with supper for 30 days., Disp: 30 tablet, Rfl: 3 .  Spacer/Aero-Holding Chambers (AEROCHAMBER W/FLOWSIGNAL) inhaler, Use as directed with all inhalers.,  Disp: , Rfl:  .  albuterol (PROVENTIL) (2.5 MG/3ML) 0.083% nebulizer solution, Take 3 mLs (2.5 mg total) by nebulization every 6 (six) hours as needed for wheezing or shortness of breath. (Patient not taking: Reported on 04/07/2018), Disp: 150 mL, Rfl: 1  Allergies Clarithromycin; Codeine; Ibuprofen; Naproxen; Nsaids; and Penicillins  Review of Systems Review of Systems - Oncology ROS negative Left LE pain   Physical Exam  Vitals Wt Readings from Last 3 Encounters:  04/07/18 212 lb 11.2 oz (96.5 kg)  03/18/18 212 lb 12.8 oz (96.5 kg)  12/15/17 206 lb (93.4 kg)   Temp Readings from Last 3 Encounters:  04/07/18 97.7 F (36.5 C) (Oral)  03/18/18 98 F (36.7 C) (Oral)  02/24/18 98.2 F (36.8 C) (Oral)   BP Readings from Last 3 Encounters:  04/07/18 122/86  03/18/18 119/84  02/24/18 (!) 131/94   Pulse Readings from Last 3 Encounters:  04/07/18 78  03/18/18 65  02/24/18 81   Constitutional: Well-developed, well-nourished, and in no distress.   HENT: Head: Normocephalic and atraumatic.  Mouth/Throat: No oropharyngeal exudate. Mucosa moist. Eyes: Pupils are equal, round, and reactive to light. Conjunctivae are normal. No scleral icterus.  Neck: Normal range of motion. Neck supple. No JVD present.  Cardiovascular: Normal rate, regular rhythm and normal heart sounds.  Exam reveals no gallop and no friction rub.   No murmur heard. Pulmonary/Chest: Effort normal and breath sounds normal. No respiratory distress. No wheezes.No rales.  Abdominal: Soft. Bowel sounds are normal. No distension. There is no tenderness. There is no guarding.  Musculoskeletal: Tender in  left popliteal area.  Left leg slightly larger than right.   Lymphadenopathy: No cervical,axillary or supraclavicular adenopathy.  Neurological: Alert and oriented to person, place, and time. No cranial nerve deficit.  Skin: Skin is warm and dry. No rash noted. No erythema. No pallor.  Psychiatric: Affect and judgment normal.   Labs Appointment on 04/07/2018  Component Date Value Ref Range Status  . CRP 04/07/2018 1.0* <1.0 mg/dL Final   Performed at [facility] [address], [place], [place] [address]  . LDH 04/07/2018 258* 98 - 192 U/L Final   Performed at [facility], [place] [address], [place], [place] [address]  . Sodium 04/07/2018 139  135 - 145 mmol/L Final  . Potassium 04/07/2018 4.0  3.5 - 5.1 mmol/L Final  . Chloride 04/07/2018 106  98 - 111 mmol/L Final  . CO2 04/07/2018 26  22 - 32 mmol/L Final  . Glucose, Bld 04/07/2018 86  70 - 99 mg/dL Final  . BUN 04/07/2018 11  6 - 20 mg/dL Final  . Creatinine 04/07/2018 0.82  0.44 - 1.00 mg/dL Final  . Calcium 04/07/2018 8.5* 8.9 - 10.3 mg/dL Final  . Total Protein 04/07/2018 7.7  6.5 - 8.1 g/dL Final  . Albumin 04/07/2018 3.6  3.5 - 5.0 g/dL Final  . AST 04/07/2018 23  15 - 41 U/L Final  . ALT 04/07/2018 23  0 - 44 U/L Final  . Alkaline Phosphatase 04/07/2018 94  38 - 126 U/L Final  . Total Bilirubin 04/07/2018 0.6  0.3 - 1.2 mg/dL Final  . GFR, Est Non Af Am 04/07/2018 >60  >60 mL/min Final  . GFR, Est AFR Am 04/07/2018 >60  >60 mL/min Final  . Anion gap 04/07/2018 7  5 - 15 Final   Performed at [facility], [place] [address], [place], [place] [address]  . WBC Count  04/07/2018 6.4  4.0 - 10.5 K/uL Final  . RBC 04/07/2018 4.45  3.87 - 5.11 MIL/uL Final  . Hemoglobin 04/07/2018 13.7  12.0 - 15.0 g/dL Final  . HCT 04/07/2018 41.8  36.0 - 46.0 % Final  . MCV 04/07/2018 93.9  80.0 - 100.0 fL Final  . MCH 04/07/2018 30.8  26.0 - 34.0 pg Final  . MCHC 04/07/2018  32.8  30.0 - 36.0 g/dL Final  . RDW 04/07/2018 14.0  11.5 - 15.5 % Final  . Platelet Count 04/07/2018 249  150 - 400 K/uL Final  . nRBC 04/07/2018 0.0  0.0 - 0.2 % Final  . Neutrophils Relative % 04/07/2018 34  % Final  . Neutro Abs 04/07/2018 2.2  1.7 - 7.7 K/uL Final  . Lymphocytes Relative 04/07/2018 35  % Final  . Lymphs Abs 04/07/2018 2.2  0.7 - 4.0 K/uL Final  . Monocytes Relative 04/07/2018 8  % Final  . Monocytes Absolute 04/07/2018 0.5  0.1 - 1.0 K/uL Final  . Eosinophils Relative 04/07/2018 22  % Final  . Eosinophils Absolute 04/07/2018 1.4* 0.0 - 0.5 K/uL Final  . Basophils Relative 04/07/2018 1  % Final  . Basophils Absolute 04/07/2018 0.1  0.0 - 0.1 K/uL Final  . Immature Granulocytes 04/07/2018 0  % Final  . Abs Immature Granulocytes 04/07/2018 0.01  0.00 - 0.07 K/uL Final   Performed at [facility], [place] [address], [place], [place] [address]     Pathology Orders Placed This Encounter  Procedures  . CT CHEST W CONTRAST    Standing Status:   Future    Standing Expiration Date:   04/07/2019    Order Specific Question:   If indicated for the ordered procedure, I authorize the administration of contrast media per Radiology protocol    Answer:   Yes    Order Specific Question:   Preferred imaging location?    Answer:   [facility]    Order Specific Question:   Radiology Contrast Protocol - do NOT remove file path    Answer:   [email or web address]  . CT ABDOMEN PELVIS W CONTRAST    Standing Status:   Future    Standing Expiration Date:   04/07/2019    Order Specific Question:   If indicated for the ordered procedure, I authorize the administration of contrast media per Radiology protocol    Answer:   Yes    Order Specific Question:   Preferred imaging location?    Answer:   [facility]    Order Specific Question:   Is Oral Contrast requested for this exam?    Answer:   Yes, Per Radiology protocol     Order Specific Question:   Radiology Contrast Protocol - do NOT remove file path    Answer:   \\[email or web address]  . CBC with Differential ([place] Only)    Standing Status:   Future    Number of Occurrences:   1    Standing Expiration Date:   04/07/2019  . CMP ([place] only)    Standing Status:   Future    Number of Occurrences:   1    Standing Expiration Date:   04/07/2019  . Lactate dehydrogenase (LDH)    Standing Status:   Future    Number of Occurrences:   1    Standing Expiration Date:   04/07/2019  . SPEP with reflex to IFE    Standing Status:   Future  Number of Occurrences:   1    Standing Expiration Date:   04/07/2019  . Lupus anticoagulant panel*    Standing Status:   Future    Number of Occurrences:   1    Standing Expiration Date:   04/07/2019  . Beta-2-glycoprotein i abs, IgG/M/A    Standing Status:   Future    Number of Occurrences:   1    Standing Expiration Date:   04/07/2019  . Factor 5 Leiden*    Standing Status:   Future    Number of Occurrences:   1    Standing Expiration Date:   04/07/2019  . Prothrombin gene mutation*    Standing Status:   Future    Number of Occurrences:   1    Standing Expiration Date:   04/07/2019  . ANA, IFA (with reflex)    Standing Status:   Future    Number of Occurrences:   1    Standing Expiration Date:   04/07/2019  . Rheumatoid factor    Standing Status:   Future    Number of Occurrences:   1    Standing Expiration Date:   04/07/2019  . C-reactive protein    Standing Status:   Future    Number of Occurrences:   1    Standing Expiration Date:   04/07/2019       Zoila Shutter MD

## 2018-04-08 LAB — RHEUMATOID FACTOR: Rheumatoid fact SerPl-aCnc: <10 IU/mL (ref 0.0–13.9)

## 2018-04-08 LAB — LUPUS ANTICOAGULANT PANEL
DRVVT: 134.5 s — ABNORMAL HIGH (ref 0.0–47.0)
PTT LA: 49.1 s (ref 0.0–51.9)

## 2018-04-08 LAB — PROTEIN ELECTROPHORESIS, SERUM, WITH REFLEX
A/G Ratio: 1 (ref 0.7–1.7)
Albumin ELP: 3.5 g/dL (ref 2.9–4.4)
Alpha-1-Globulin: 0.2 g/dL (ref 0.0–0.4)
Alpha-2-Globulin: 0.8 g/dL (ref 0.4–1.0)
Beta Globulin: 1 g/dL (ref 0.7–1.3)
Gamma Globulin: 1.6 g/dL (ref 0.4–1.8)
Globulin, Total: 3.6 g/dL (ref 2.2–3.9)
Total Protein ELP: 7.1 g/dL (ref 6.0–8.5)

## 2018-04-08 LAB — DRVVT MIX: dRVVT Mix: 88.8 s — ABNORMAL HIGH (ref 0.0–47.0)

## 2018-04-08 LAB — ANTINUCLEAR ANTIBODIES, IFA: ANA Ab, IFA: NEGATIVE

## 2018-04-08 LAB — DRVVT CONFIRM: dRVVT Confirm: 1.9 ratio — ABNORMAL HIGH (ref 0.8–1.2)

## 2018-04-09 LAB — BETA-2-GLYCOPROTEIN I ABS, IGG/M/A
Beta-2 Glyco I IgG: <9 GPI IgG units (ref 0–20)
Beta-2-Glycoprotein I IgA: <9 GPI IgA units (ref 0–25)
Beta-2-Glycoprotein I IgM: <9 GPI IgM units (ref 0–32)

## 2018-04-12 LAB — FACTOR 5 LEIDEN

## 2018-04-13 ENCOUNTER — Encounter (INDEPENDENT_AMBULATORY_CARE_PROVIDER_SITE_OTHER): Payer: Self-pay | Admitting: Primary Care

## 2018-04-13 LAB — PROTHROMBIN GENE MUTATION

## 2018-04-14 ENCOUNTER — Inpatient Hospital Stay: Payer: BLUE CROSS/BLUE SHIELD | Admitting: Internal Medicine

## 2018-04-14 ENCOUNTER — Inpatient Hospital Stay: Payer: BLUE CROSS/BLUE SHIELD | Attending: Internal Medicine | Admitting: Internal Medicine

## 2018-04-14 ENCOUNTER — Encounter (INDEPENDENT_AMBULATORY_CARE_PROVIDER_SITE_OTHER): Payer: Self-pay | Admitting: Primary Care

## 2018-04-14 ENCOUNTER — Other Ambulatory Visit: Payer: Self-pay

## 2018-04-14 ENCOUNTER — Telehealth: Payer: Self-pay | Admitting: *Deleted

## 2018-04-14 DIAGNOSIS — I82402 Acute embolism and thrombosis of unspecified deep veins of left lower extremity: Secondary | ICD-10-CM

## 2018-04-14 DIAGNOSIS — Z7901 Long term (current) use of anticoagulants: Secondary | ICD-10-CM

## 2018-04-14 DIAGNOSIS — Z803 Family history of malignant neoplasm of breast: Secondary | ICD-10-CM | POA: Diagnosis not present

## 2018-04-14 NOTE — Telephone Encounter (Signed)
Received VM message from patient regarding her recent lab results.  She has seen them on My chart and wants to know what the mean. She is asking if she needs to wait until her f/u visit on 04/21/18  Please advise

## 2018-04-14 NOTE — Progress Notes (Signed)
Virtual Visit via Telephone Note  I connected with Linda Dalton on 04/14/18 at  2:40 PM EDT by telephone and verified that I am speaking with the correct person using two identifiers.   I discussed the limitations, risks, security and privacy concerns of performing an evaluation and management service by telephone and the availability of in person appointments. I also discussed with the patient that there may be a patient responsible charge related to this service. The patient expressed understanding and agreed to proceed.  Interval History:  Historical data obtained from note dated 04/07/2018.  43 year old female referred for evaluation due to DVT.  Patient had a history of DVTs in 2015.  She reports she was on a trial at [facility] using birth control for fibroids.  She subsequently was recommended for hysterectomy.  She reports not long after the surgery she was at home and developed some darkening of her left lower extremity and pain.  She was seen by Dr. Laurence Ferrari and underwent stent placement with thrombolysis.  She reports she took anticoagulation for approximately 4 months after the procedure.  On subsequent evaluation by Dr. Laurence Ferrari she reports the clots had resolved.  She reports she was also seen 1 year after thrombolysis and was told she had no evidence of thrombosis.  She denies any family history of blood clots.  She has a family history of breast cancer in her aunts diagnosed after the age of 53 colon cancer in a grandfather and prostate cancer.         Patient reports she stopped anticoagulation on her own.  She reports pain is not improved since she started Xarelto.  She has problems with asthma.   Patient is seen today for consultation due to chronic left lower extremity DVT                                      Observations/Objective:Review of lab results  Assessment and Plan:1.  Left LE DVT.   43 year old female referred for evaluation due to left LE DVT.  Patient had a  history of Left LE DVTs in 2015.  She reports she was on a trial at [facility] using birth control for fibroids.  She subsequently was recommended for hysterectomy.  She reports not long after the surgery she was at home and developed some darkening of her left lower extremity and pain.  She was seen by Dr. Laurence Ferrari and underwent stent placement with thrombolysis.  She reports she took anticoagulation for approximately 4 months after the procedure.  On subsequent evaluation by Dr. Laurence Ferrari she reports the clots had resolved.  She reports she was also seen 1 year after thrombolysis and was told she had no evidence of thrombosis.  She denies any family history of blood clots.  She has a family history of breast cancer in her aunts diagnosed after the age of 26 colon cancer in a grandfather and prostate cancer.         Patient reports she stopped anticoagulation on her own.  She reports pain is not improved since she started Xarelto.  She has problems with asthma.                      Review of chart shows patient had an ultrasound done 05/01/2013 that showed Summary:  - Findings consistent with acute deep vein thrombosis involving the posterior tibial,  popliteal, femoral, and common femoral veins of theleft lower extremity. No propagation noted to the right lower extremity. - No evidence of Baker's cyst on the left.  She had follow-up ultrasound done 08/10/2013 that showed IMPRESSION: 1. No evidence of deep venous thrombosis. 2. Excellent respiratory phasicity in the common femoral vein suggests patency of the iliac vein stent. 3. Their may be trace residual recanalized chronic DVT in the popliteal vein.  Patient had a CT angio chest done in April 2018 that was negative for PE.  Patient reports she recently developed pain in the lower extremity.  Reports her job requires significant standing.  She underwent bilateral lower extremity Doppler 02/24/2018 with results  showing Summary: Right: No evidence of common femoral vein obstruction. Left: Findings consistent with chronic deep vein thrombosis involving the left femoral vein, and left popliteal vein. No cystic structure found in the popliteal fossa. No evidence of acute DVT.  Labs done today 04/07/2018 reviewed and showed WBC 6.4 HB 13.7 plts 249,000.  Chemistries WNL with K+ 4 Cr 0.82 and normal LFTs.    Previously, I discussed with her possible etiologies for DVT such as surgery, immobility, frequent travel, malignancy, family history.  She described immobility after uterine fibroid surgery which is a risk factor for thrombosis.  Recent symptoms led to repeat USN evaluation done 02/2018 that showed chronic DVT.    Hypercoagulable work-up shows negative Factor V leiden, negative PT gene mutation normal B2 glycoprotein antibodies.   Lupus anticoagulant testing results are consistent with the presence of a lupus anticoagulant.  NOTE: Only persistent lupus anticoagulants are thought to be of  clinical  significance. For this reason, repeat testing in 12 or more weeks  after an  initial positive result is recommended to confirm or refute the presence of a lupus anticoagulant, depending on clinical presentation.  Results of lupus anticoagulant tests may be falsely positive in the presence of certain anticoagulant therapies.   I have discussed with her test will be repeated in 3-4 months.    Dr. Laurence Ferrari is willing to meet back with pt to determine if she has any other options other than ongoing anticoagulation.  Will contact his scheduler to notify pt of follow-up.    Pt was planned for CT chest, abdomen and pelvis for further evaluation due to her history of chronic thrombosis.  This has not been done.    The patient will undergo repeat bilateral Doppler evaluation of the lower extremities in 08/2018.  She should continue Xarelto 20 mg po daily as directed. I have discussed with her due to recurrent  thrombus, long term anticoagulation is recommended.    2.  Family history of breast cancer.  Pt reports this occurred in Aunts over the age of 27.  Pt has reportedly undergone mammogram evaluation in the past.  Mammogram screening as recommended.    3.  Asthma.  Pulse ox is 100% on room air.  Follow-up with PCP or pulmonary as directed.   4.  Uterine fibroid.  Pt had hysterectomy in 2015 and reported she was primarily bed bound after surgery.    5.  LE pain.  Pt advised to use tylenol or ES tylenol.  I discussed with her it will take time for improvement in pain with anticoagulation as she was only recently diagnosed with DVT in 02/2018.  SPEP, ANA, RF negative.    6.  Health maintenance.  Mammogram screenings as recommended.    Follow Up Instructions:Pt will follow-up in 08/2018 with repeat  bilateral LE doppler at that time.    I discussed the assessment and treatment plan with the patient. The patient was provided an opportunity to ask questions and all were answered. The patient agreed with the plan and demonstrated an understanding of the instructions.   The patient was advised to call back or seek an in-person evaluation if the symptoms worsen or if the condition fails to improve as anticipated.  I provided 15 minutes of non-face-to-face time during this encounter.   Zoila Shutter, MD

## 2018-04-14 NOTE — Patient Instructions (Signed)
Follow-up in 08/2018 with doppler as previously recommended.

## 2018-04-21 ENCOUNTER — Ambulatory Visit: Payer: BLUE CROSS/BLUE SHIELD | Admitting: Internal Medicine

## 2018-06-02 ENCOUNTER — Ambulatory Visit (HOSPITAL_COMMUNITY): Admission: RE | Admit: 2018-06-02 | Payer: BLUE CROSS/BLUE SHIELD | Source: Ambulatory Visit

## 2018-06-13 IMAGING — DX DG CHEST 2V
2 series · 2 of 2 positions shown · non-contrast
Comparison: August 24, 2015

CLINICAL DATA: Chest pain this morning. Shortness of breath since
yesterday.

EXAM:
CHEST  2 VIEW

[chest pa]
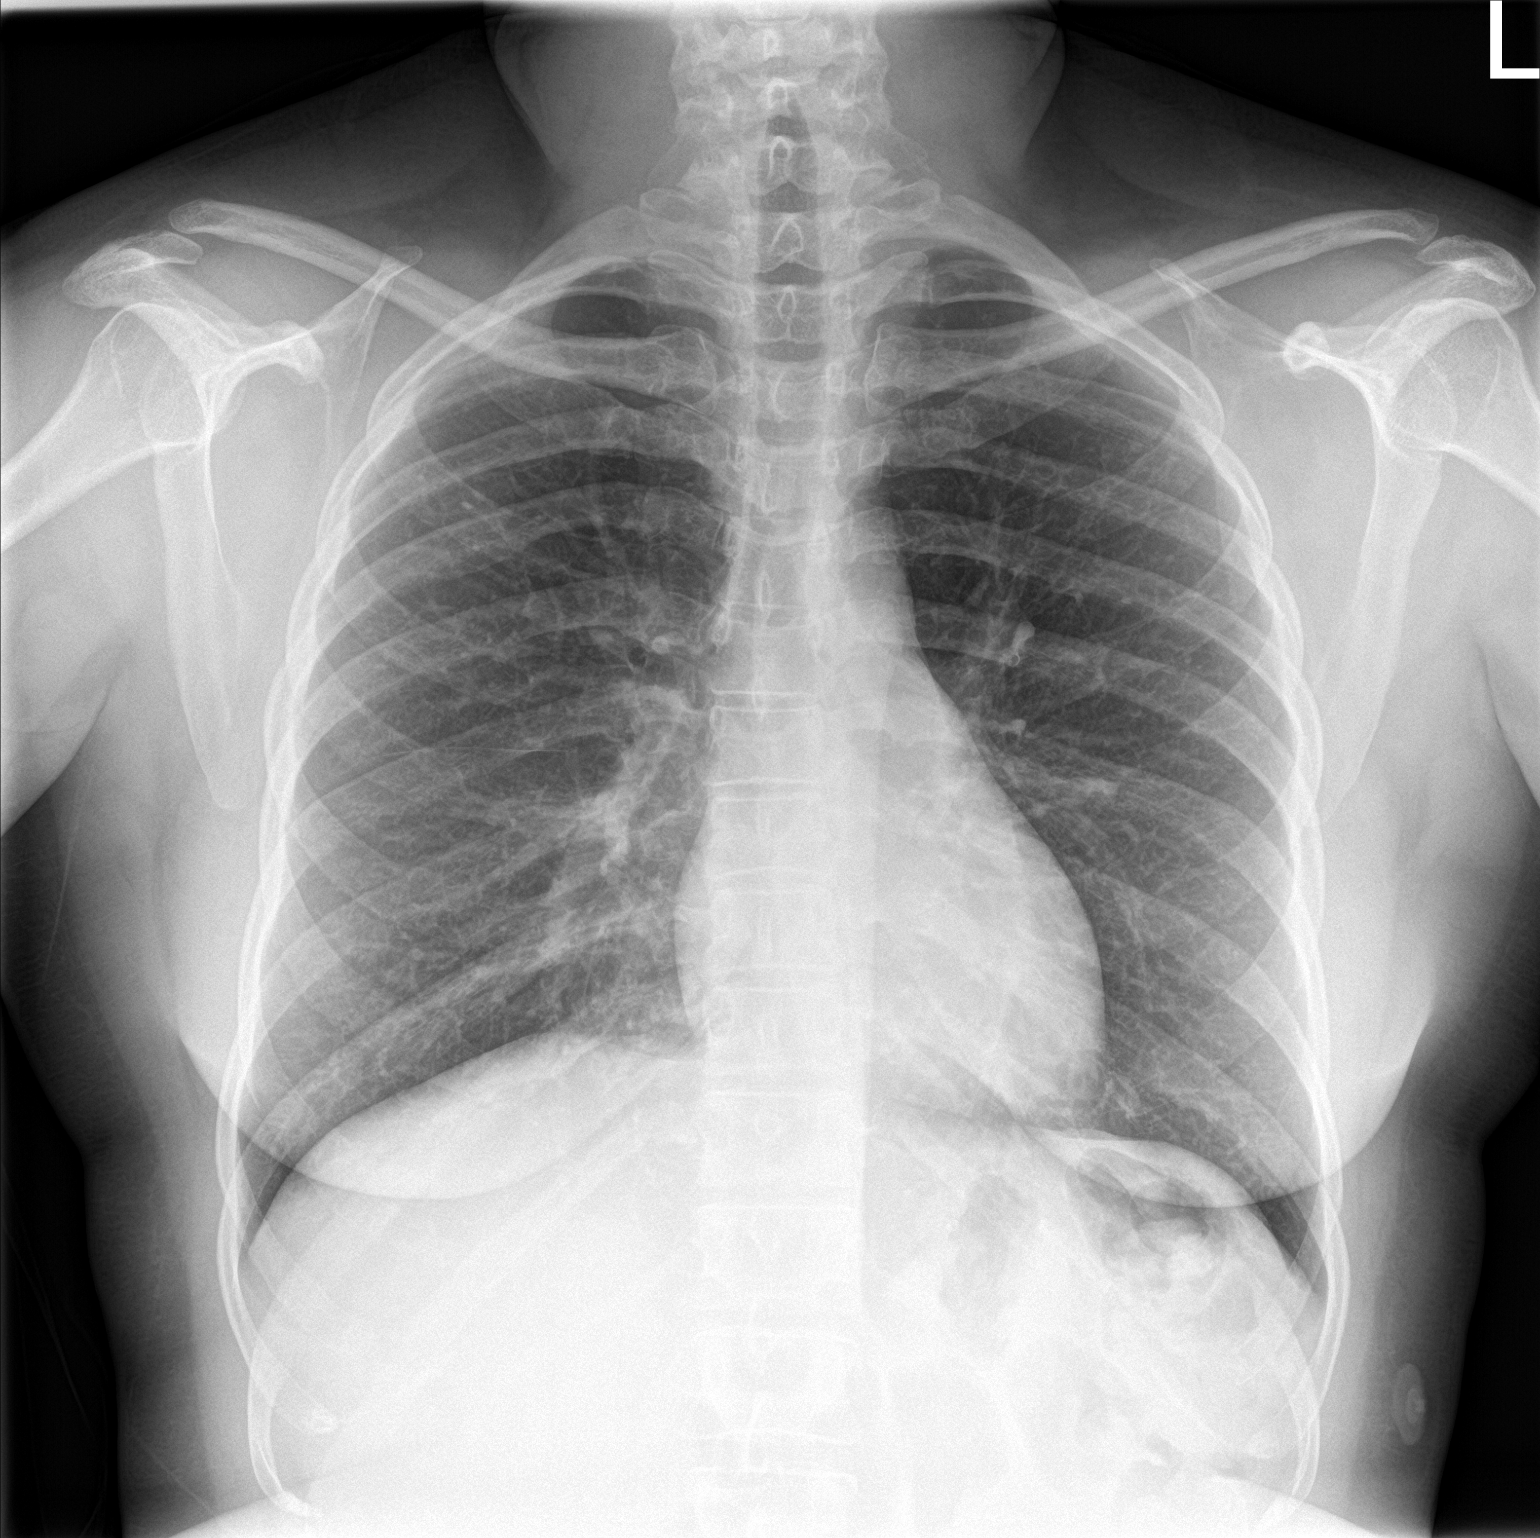

[chest lat]
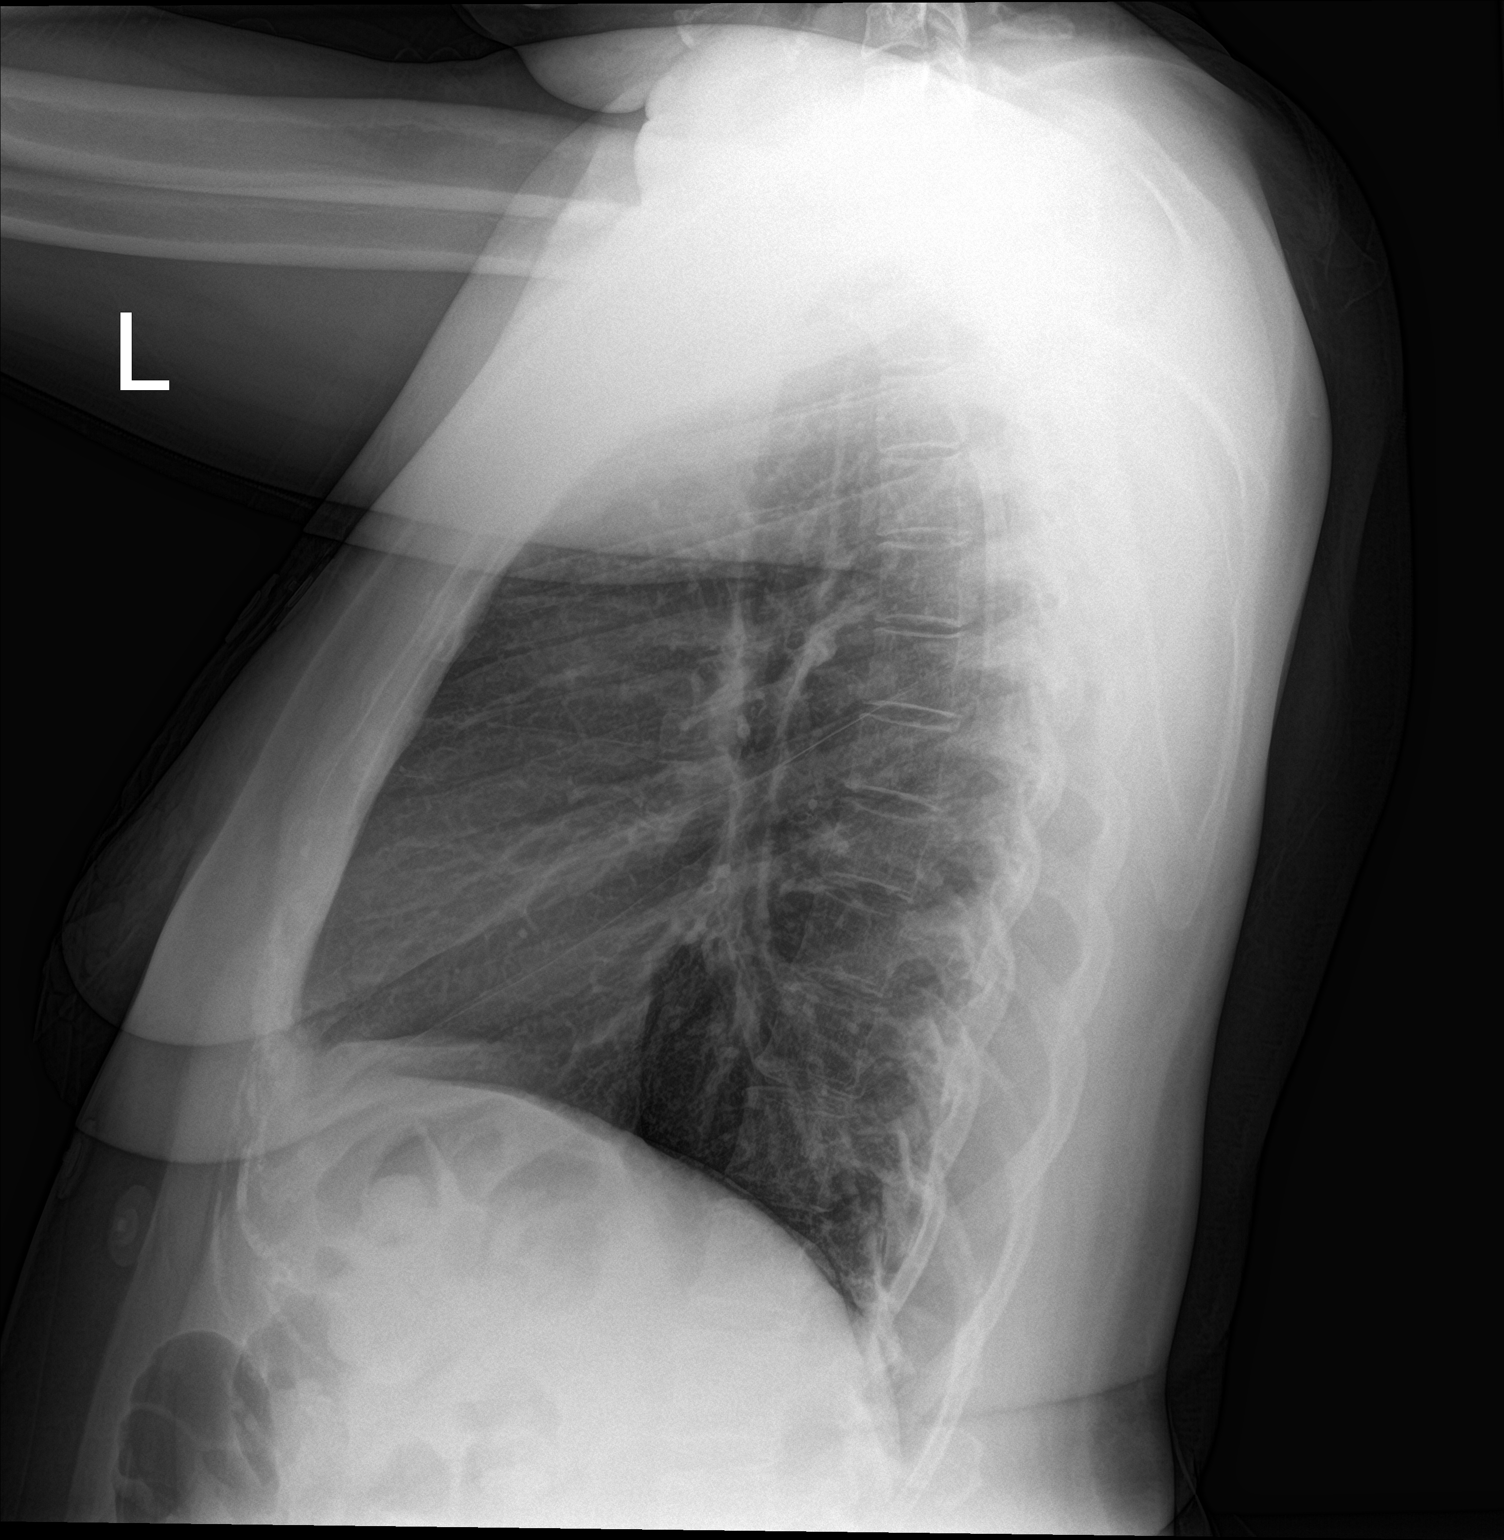

[2 of 2 positions shown; findings below may reference images not displayed]

FINDINGS: The heart size and mediastinal contours are within normal limits.
There is no focal infiltrate, pulmonary edema, or pleural effusion.
The visualized skeletal structures are unremarkable.
IMPRESSION: No active cardiopulmonary disease.

## 2019-04-17 ENCOUNTER — Telehealth: Payer: BLUE CROSS/BLUE SHIELD

## 2019-04-17 ENCOUNTER — Ambulatory Visit (INDEPENDENT_AMBULATORY_CARE_PROVIDER_SITE_OTHER): Payer: BLUE CROSS/BLUE SHIELD

## 2019-04-17 ENCOUNTER — Other Ambulatory Visit: Payer: Self-pay

## 2019-04-17 ENCOUNTER — Ambulatory Visit
Admission: EM | Admit: 2019-04-17 | Discharge: 2019-04-17 | Disposition: A | Discharge status: Home / Self Care | Payer: BLUE CROSS/BLUE SHIELD | Attending: Physician Assistant | Admitting: Physician Assistant

## 2019-04-17 DIAGNOSIS — Z7901 Long term (current) use of anticoagulants: Secondary | ICD-10-CM | POA: Diagnosis not present

## 2019-04-17 DIAGNOSIS — U071 COVID-19: Secondary | ICD-10-CM

## 2019-04-17 DIAGNOSIS — R509 Fever, unspecified: Secondary | ICD-10-CM

## 2019-04-17 DIAGNOSIS — J189 Pneumonia, unspecified organism: Secondary | ICD-10-CM | POA: Diagnosis not present

## 2019-04-17 DIAGNOSIS — Z87891 Personal history of nicotine dependence: Secondary | ICD-10-CM | POA: Diagnosis not present

## 2019-04-17 DIAGNOSIS — R0602 Shortness of breath: Secondary | ICD-10-CM | POA: Diagnosis not present

## 2019-04-17 MED ORDER — IPRATROPIUM-ALBUTEROL 0.5-2.5 (3) MG/3ML IN SOLN: 3.0000 mL | RESPIRATORY_TRACT | 0 refills | Status: DC | PRN

## 2019-04-17 MED ORDER — LEVOFLOXACIN 750 MG PO TABS: 750.0000 mg | ORAL_TABLET | Freq: Every day | ORAL | 0 refills | Status: AC

## 2019-04-17 MED ORDER — ACETAMINOPHEN 325 MG PO TABS
650.0000 mg | ORAL_TABLET | Freq: Once | ORAL | Status: AC
  Administered 2019-04-17: 650 mg via ORAL

## 2019-04-17 NOTE — ED Triage Notes (Signed)
Patient presents with SOB, cough, wheezing, fever and body aches.  She has tested positive for COVID (received results yesterday). Symptoms started one week ago which prompted her to be tested. She is doing up to 6 nebulizer treatments daily that only offer short-lasting results.

## 2019-04-17 NOTE — ED Provider Notes (Signed)
[facility]    CSN: [id number] Arrival date & time: 04/17/19  1013      History   Chief Complaint Chief Complaint  Patient presents with  . Shortness of Breath    HPI Linda Dalton is a 44 y.o. female.   44 year old female with history of asthma, recent DVT on Xarelto s/p IVC filter comes in for worsening COVID symptoms.  Had 1 week of nasal congestion, headache, nausea, vomiting.  Fever, T-max 10 2, responsive to antipyretic.  Vomiting is now controlled and able to tolerate oral intake.  States 2 to 3 days ago, started having worsening symptoms, with shortness of breath that is mainly during exertion.  She started using albuterol with short temporary relief.  Former smoker.     Past Medical History:  Diagnosis Date  . Asthma   . DVT (deep venous thrombosis) ([place])   . Fibroid tumor   . PONV (postoperative nausea and vomiting)   . Presence of IVC filter   . Sinusitis   . Tonsillitis     Patient Active Problem List   Diagnosis Date Noted  . Acute respiratory failure with hypoxia and hypercapnia ([place]) 07/13/2017  . Asthma exacerbation 04/28/2016  . Moderate persistent asthma with exacerbation 03/05/2016  . Trimalleolar fracture of right ankle 02/02/2014  . DVT (deep venous thrombosis) ([place]) 05/01/2013  . Acute DVT (deep venous thrombosis) ([place]) 05/01/2013  . ASTHMA 02/04/2007  . G E REFLUX 02/04/2007    Past Surgical History:  Procedure Laterality Date  . ABDOMINAL HYSTERECTOMY    . BREAST SURGERY    . ORIF ANKLE FRACTURE Right 02/02/2014   Procedure: OPEN REDUCTION INTERNAL FIXATION (ORIF) RIGHT TRIMALLEOLAR ANKLE FRACTURE;  Surgeon: Marianna Payment, MD;  Location: [place];  Service: Orthopedics;  Laterality: Right;  . TUBAL LIGATION      OB History   No obstetric history on file.      Home Medications    Prior to Admission medications   Medication Sig Start Date End Date Taking? Authorizing Provider  acetaminophen (TYLENOL) 500  MG tablet Take 500-1,000 mg by mouth every 6 (six) hours as needed (for pain or headaches).    [provider]  albuterol (PROVENTIL HFA;VENTOLIN HFA) 108 (90 Base) MCG/ACT inhaler Inhale 2 puffs into the lungs every 6 (six) hours as needed for wheezing or shortness of breath.    [provider]  albuterol (PROVENTIL) (2.5 MG/3ML) 0.083% nebulizer solution Take 3 mLs (2.5 mg total) by nebulization every 6 (six) hours as needed for wheezing or shortness of breath. 01/15/18   Clent Demark, PA-C  budesonide-formoterol [facility]) 160-4.5 MCG/ACT inhaler Inhale 2 puffs into the lungs 2 (two) times daily. 12/07/17   Clent Demark, PA-C  Dupilumab 300 MG/2ML SOSY Inject into the skin. 03/01/18   [provider]  fexofenadine (ALLEGRA) 180 MG tablet Take 180 mg by mouth daily.    [provider]  fluticasone (FLONASE) 50 MCG/ACT nasal spray Place 2 sprays into both nostrils daily. 05/14/17   Hedges, Dellis Filbert, PA-C  ipratropium-albuterol (DUONEB) 0.5-2.5 (3) MG/3ML SOLN Take 3 mLs by nebulization every 4 (four) hours as needed for up to 2 doses. 04/17/19   Tasia Catchings, Keesha Pellum V, PA-C  levofloxacin (LEVAQUIN) 750 MG tablet Take 1 tablet (750 mg total) by mouth daily for 5 days. 04/17/19 04/22/19  Tasia Catchings, Keisean Skowron V, PA-C  montelukast (SINGULAIR) 10 MG tablet Take 1 tablet (10 mg total) by mouth daily. 08/11/17   Domenica Fail  Shanon Brow, PA-C  Multiple Vitamins-Calcium (ONE-A-DAY WOMENS PO) Take 1 tablet by mouth daily.    [provider]  rivaroxaban (XARELTO) 20 MG TABS tablet Take 1 tablet (20 mg total) by mouth daily with supper for 30 days. 03/23/18 04/22/18  Kerin Perna, NP  Spacer/Aero-Holding Chambers (AEROCHAMBER W/FLOWSIGNAL) inhaler Use as directed with all inhalers. 01/15/18   [provider]    Family History Family History  Problem Relation Age of Onset  . Congestive Heart Failure Mother   . Diabetes Mother   . Asthma Mother   . Hypertension Mother   . Asthma  Father   . Sleep apnea Father   . Hypercholesterolemia Father   . Heart murmur Sister   . Heart disease Brother   . Cancer Other   . Congestive Heart Failure Other     Social History Social History   Tobacco Use  . Smoking status: Former Smoker    Packs/day: 1.00    Years: 8.00    Pack years: 8.00    Types: Cigarettes    Quit date: 07/14/2014    Years since quitting: 4.7  . Smokeless tobacco: Never Used  Substance Use Topics  . Alcohol use: Not Currently    Alcohol/week: 0.0 standard drinks  . Drug use: No     Allergies   Clarithromycin, Codeine, Ibuprofen, Naproxen, Nsaids, and Penicillins   Review of Systems Review of Systems  Reason unable to perform ROS: See HPI as above.     Physical Exam Triage Vital Signs ED Triage Vitals  Enc Vitals Group     BP 04/17/19 1020 113/74     Pulse Rate 04/17/19 1020 (!) 104     Resp 04/17/19 1040 20     Temp 04/17/19 1020 (!) 101.4 F (38.6 C)     Temp Source 04/17/19 1020 Oral     SpO2 04/17/19 1020 95 %     Weight --      Height --      Head Circumference --      Peak Flow --      Pain Score 04/17/19 1034 8     Pain Loc --      Pain Edu? --      Excl. in [place]? --    No data found.  Updated Vital Signs BP 113/74 (BP Location: Left Arm)   Pulse (!) 104   Temp (!) 101.4 F (38.6 C) (Oral)   Resp 20   LMP 03/15/2013   SpO2 95%   Physical Exam Constitutional:      General: She is not in acute distress.    Appearance: Normal appearance. She is not ill-appearing, toxic-appearing or diaphoretic.  HENT:     Head: Normocephalic and atraumatic.     Mouth/Throat:     Mouth: Mucous membranes are moist.     Pharynx: Oropharynx is clear. Uvula midline.  Cardiovascular:     Rate and Rhythm: Normal rate and regular rhythm.     Heart sounds: Normal heart sounds. No murmur. No friction rub. No gallop.   Pulmonary:     Effort: Pulmonary effort is normal. No accessory muscle usage, prolonged expiration, respiratory  distress or retractions.     Comments: Limited exam as patient coughing throughout, slight wheezing with cough. Musculoskeletal:     Cervical back: Normal range of motion and neck supple.  Neurological:     General: No focal deficit present.     Mental Status: She is alert and oriented to person,  place, and time.      UC Treatments / Results  Labs (all labs ordered are listed, but only abnormal results are displayed) Labs Reviewed - No data to display  EKG   Radiology DG Chest 2 View  Result Date: 04/17/2019 CLINICAL DATA:  Fever with shortness of breath.  COVID-19 positive EXAM: CHEST - 2 VIEW COMPARISON:  December 15, 2017 FINDINGS: There is ill-defined airspace opacity in the medial left base with patchy atelectasis in each mid and lower lung zone. Lungs elsewhere clear. Heart size and pulmonary vascularity are normal. No adenopathy. No bone lesions. IMPRESSION: Focal airspace opacity in the medial left base. Areas of patchy atelectasis in the mid and lower lung regions. Findings consistent with atypical organism pneumonia. Heart size normal.  No adenopathy evident. Electronically Signed   By: Lowella Grip III M.D.   On: 04/17/2019 11:02    Procedures Procedures (including critical care time)  Medications Ordered in UC Medications  acetaminophen (TYLENOL) tablet 650 mg (650 mg Oral Given 04/17/19 1104)    Initial Impression / Assessment and Plan / UC Course  I have reviewed the triage vital signs and the nursing notes.  Pertinent labs & imaging results that were available during my care of the patient were reviewed by me and considered in my medical decision making (see chart for details).    Discussed chest x-ray results with patient.  X-ray results inconsistent with Covid pneumonia, will cover for superimposed bacterial infection with Levaquin given patient is allergic clarithromycin, penicillins.  Discussed reactive airway also contributing to symptoms.  At this time,  will avoid steroids, and have patient do 2 doses of DuoNeb at home.  O2 sat ranging 90 to 93% when coughing, can go up to 95%.  Given patient without acute respiratory distress, discussed will try treating this at home.  However, low threshold for ED visit.  Strict return precautions given.  Final Clinical Impressions(s) / UC Diagnoses   Final diagnoses:  Pneumonia of left lower lobe due to infectious organism  COVID-19    ED Prescriptions    Medication Sig Dispense Auth. Provider   levofloxacin (LEVAQUIN) 750 MG tablet Take 1 tablet (750 mg total) by mouth daily for 5 days. 5 tablet Aidynn Polendo V, PA-C   ipratropium-albuterol (DUONEB) 0.5-2.5 (3) MG/3ML SOLN Take 3 mLs by nebulization every 4 (four) hours as needed for up to 2 doses. 6 mL Ok Edwards, PA-C     PDMP not reviewed this encounter.   Ok Edwards, PA-C 04/17/19 1129

## 2019-04-17 NOTE — Discharge Instructions (Addendum)
Your chest xray shows left lower lobe pneumonia. This is not consistent with COVID pneumonia. So I'm covering you for a possible bacterial infection. Start levaquin as directed. Due to possible bacterial infection, I will leave out the steroids for now. 2 duoneb treatments at home, then continue your inhalers. Any worsening of symptoms, go to the emergency department.

## 2019-04-19 ENCOUNTER — Telehealth: Payer: Self-pay | Admitting: Emergency Medicine

## 2019-04-19 NOTE — Telephone Encounter (Signed)
Called to check on pt; pt sts she is no worse but also no better; pt sts able to lay flat and rest but activity causes SOB and cough; sts is doing breathing treatments but still having non productive cough; pt instructed to be rechecked if she is any worse; pt sts able to drink liquids to stay hydrated

## 2019-04-20 ENCOUNTER — Telehealth: Payer: Self-pay | Admitting: Physician Assistant

## 2019-04-20 MED ORDER — PREDNISONE 50 MG PO TABS: 50.0000 mg | ORAL_TABLET | Freq: Every day | ORAL | 0 refills | Status: DC

## 2019-04-20 NOTE — Telephone Encounter (Signed)
Patient called back. States received phone call from PCP to check up on her. After discussing with PCP, feels that she should start prednisone at this time as in the past she has required prednisone to help with shob after viral illness. Will call in prednisone 50mg  QD x 5 days to pharmacy of choice and continue to monitor symptoms.

## 2019-04-20 NOTE — Telephone Encounter (Signed)
Called patient to check in. Patient states feeling a lot better compared to yesterday when she spoke with RN. States after the call, she did a duoneb treatment and took mucinex. She had productive cough which significantly improved her symptoms. She has been afebrile. She still has mild dyspnea on exertion, but not shob at rest.   COVID positive day 10. Patient speaking in full sentences. ?mucus plug causing prior symptoms. Will have patient finish levaquin. Continue duoneb/albuterol as needed. If continues to be afebrile without worsening symptoms, but no resolution of DOE, can call in prednisone 50mg  QD x 5 days, D#5, R#0. Return precautions given.

## 2019-08-04 ENCOUNTER — Other Ambulatory Visit: Payer: Self-pay | Admitting: Critical Care Medicine

## 2019-12-21 IMAGING — DX DG CHEST 2V
2 series · 2 of 2 positions shown · non-contrast
Comparison: Chest radiograph 04/27/2016.

CLINICAL DATA: Earache for 5 days.  Asthma.

EXAM:
CHEST - 2 VIEW

[chest pa]
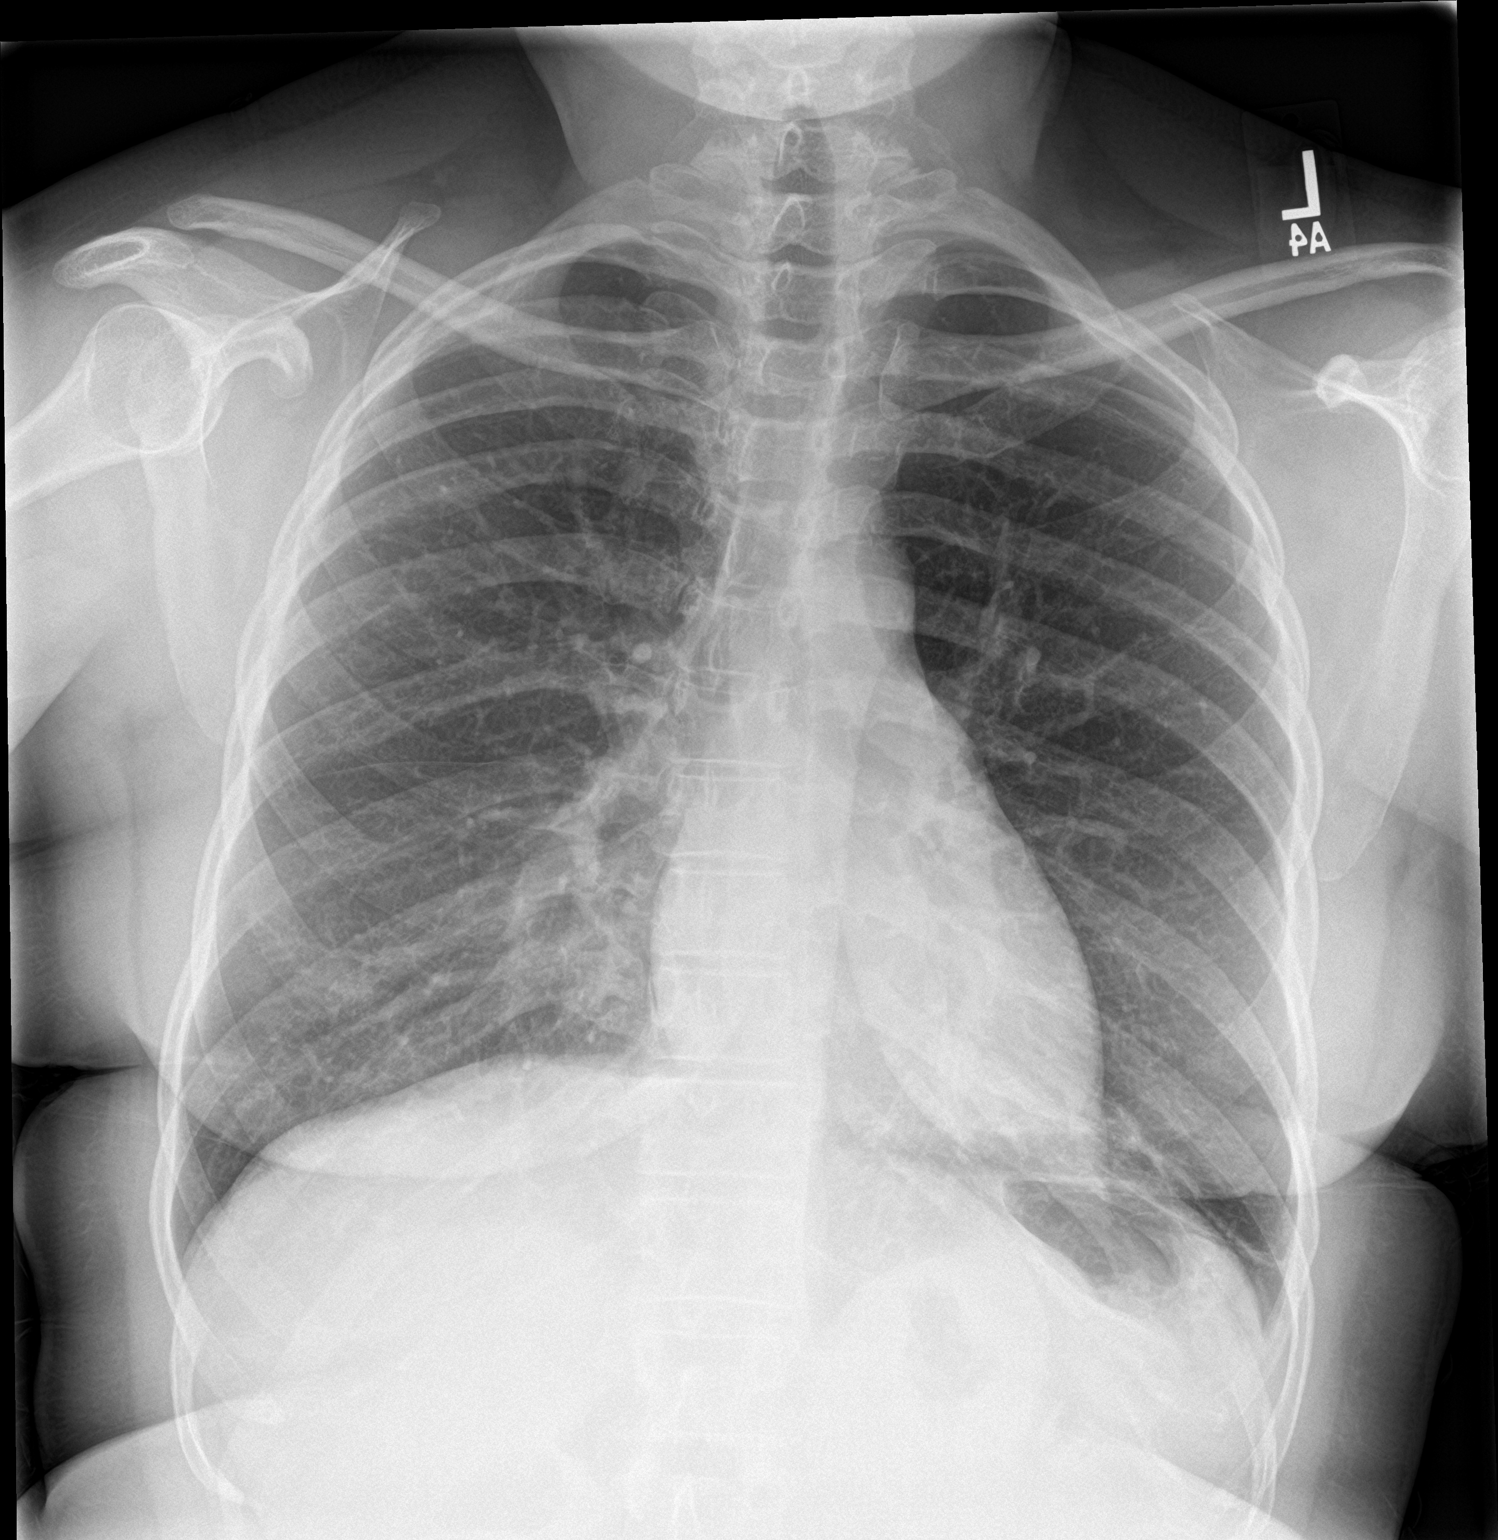

[chest lat]
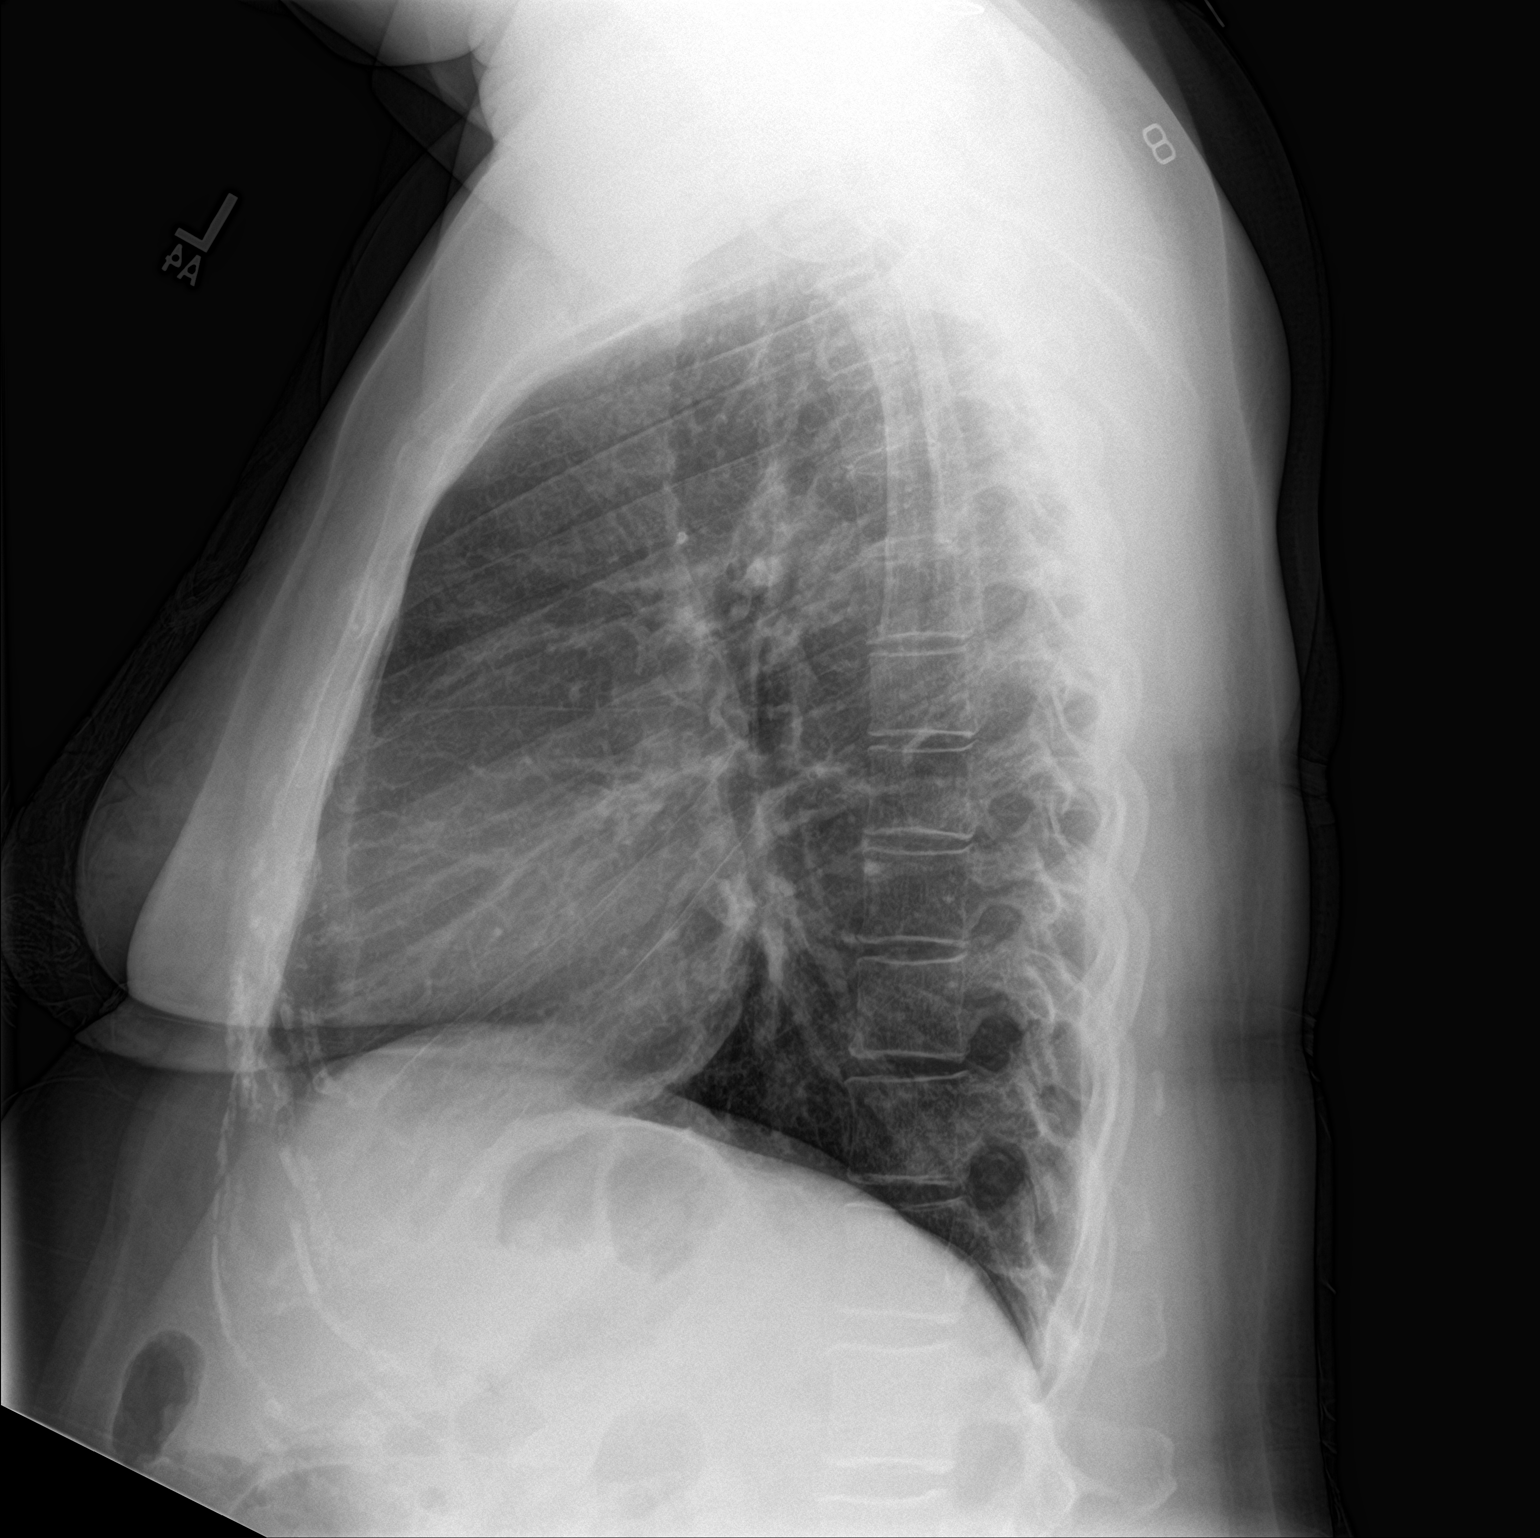

[2 of 2 positions shown; findings below may reference images not displayed]

FINDINGS: The heart size and mediastinal contours are within normal limits.
Both lungs are clear. The visualized skeletal structures are
unremarkable.
IMPRESSION: No active cardiopulmonary disease.  Improved aeration from priors.

## 2020-01-05 ENCOUNTER — Other Ambulatory Visit: Payer: Self-pay | Admitting: Critical Care Medicine

## 2020-05-08 ENCOUNTER — Other Ambulatory Visit: Payer: Self-pay

## 2020-05-08 MED FILL — Budesonide-Formoterol Fumarate Dihyd Aerosol 160-4.5 MCG/ACT: RESPIRATORY_TRACT | 30 days supply | Qty: 10.2 | Fill #0 | Status: AC

## 2020-05-08 MED FILL — Budesonide-Formoterol Fumarate Dihyd Aerosol 160-4.5 MCG/ACT: RESPIRATORY_TRACT | 23 days supply | Qty: 10.2 | Fill #0 | Status: CN

## 2020-06-04 ENCOUNTER — Other Ambulatory Visit: Payer: Self-pay

## 2020-06-04 MED ORDER — ALBUTEROL SULFATE HFA 108 (90 BASE) MCG/ACT IN AERS
2.0000 | INHALATION_SPRAY | RESPIRATORY_TRACT | 1 refills | Status: DC | PRN
  Filled 2020-06-04: qty 8.5, 16d supply, fill #0

## 2020-06-05 ENCOUNTER — Other Ambulatory Visit: Payer: Self-pay

## 2020-06-05 MED ORDER — EASIVENT MISC
1 refills | Status: AC
  Filled 2020-09-14: qty 1, fill #0

## 2020-06-06 ENCOUNTER — Other Ambulatory Visit: Payer: Self-pay

## 2020-06-06 MED ORDER — BUDESONIDE-FORMOTEROL FUMARATE 160-4.5 MCG/ACT IN AERO
2.0000 | INHALATION_SPRAY | Freq: Two times a day (BID) | RESPIRATORY_TRACT | 3 refills | Status: DC
  Filled 2020-06-06: qty 10.2, 30d supply, fill #0
  Filled 2020-07-12: qty 10.2, 30d supply, fill #1

## 2020-06-07 ENCOUNTER — Other Ambulatory Visit: Payer: Self-pay

## 2020-06-08 ENCOUNTER — Other Ambulatory Visit: Payer: Self-pay

## 2020-06-14 ENCOUNTER — Other Ambulatory Visit: Payer: Self-pay

## 2020-06-14 MED ORDER — METRONIDAZOLE 500 MG PO TABS
ORAL_TABLET | ORAL | 0 refills | Status: DC
  Filled 2020-06-14: qty 14, 7d supply, fill #0

## 2020-06-21 ENCOUNTER — Other Ambulatory Visit: Payer: Self-pay

## 2020-07-11 ENCOUNTER — Other Ambulatory Visit: Payer: Self-pay

## 2020-07-11 MED ORDER — MONTELUKAST SODIUM 10 MG PO TABS
ORAL_TABLET | ORAL | 3 refills | Status: DC
  Filled 2020-07-11: qty 90, 90d supply, fill #0

## 2020-07-11 MED ORDER — FLUTICASONE PROPIONATE 50 MCG/ACT NA SUSP
NASAL | 5 refills | Status: DC
  Filled 2020-07-11: qty 16, 30d supply, fill #0

## 2020-07-11 MED ORDER — OLOPATADINE HCL 0.2 % OP SOLN
OPHTHALMIC | 0 refills | Status: DC
  Filled 2020-07-11: qty 2.5, 18d supply, fill #0

## 2020-07-12 ENCOUNTER — Other Ambulatory Visit: Payer: Self-pay

## 2020-08-14 ENCOUNTER — Other Ambulatory Visit: Payer: Self-pay

## 2020-08-14 MED ORDER — ALBUTEROL SULFATE HFA 108 (90 BASE) MCG/ACT IN AERS
2.0000 | INHALATION_SPRAY | RESPIRATORY_TRACT | 1 refills | Status: AC | PRN
  Filled 2020-08-14: qty 8.5, 16d supply, fill #0

## 2020-08-14 MED ORDER — AEROCHAMBER PLUS FLO-VU MEDIUM MISC
1 refills | Status: AC
  Filled 2020-09-14: qty 1, fill #0

## 2020-08-14 MED ORDER — BUDESONIDE-FORMOTEROL FUMARATE 160-4.5 MCG/ACT IN AERO
INHALATION_SPRAY | RESPIRATORY_TRACT | 3 refills | Status: DC
  Filled 2020-08-14: qty 10.2, 30d supply, fill #0
  Filled 2020-09-14: qty 10.2, 30d supply, fill #1
  Filled 2020-10-19: qty 10.2, 30d supply, fill #2

## 2020-08-14 MED ORDER — MONTELUKAST SODIUM 10 MG PO TABS
ORAL_TABLET | ORAL | 3 refills | Status: AC
  Filled 2020-08-14: qty 90, 90d supply, fill #0
  Filled 2020-09-14: qty 90, 90d supply, fill #1

## 2020-08-15 ENCOUNTER — Other Ambulatory Visit: Payer: Self-pay

## 2020-08-16 ENCOUNTER — Other Ambulatory Visit: Payer: Self-pay

## 2020-09-14 ENCOUNTER — Other Ambulatory Visit: Payer: Self-pay

## 2020-09-20 ENCOUNTER — Other Ambulatory Visit: Payer: Self-pay

## 2020-09-20 MED ORDER — BUDESONIDE-FORMOTEROL FUMARATE 160-4.5 MCG/ACT IN AERO
2.0000 | INHALATION_SPRAY | Freq: Two times a day (BID) | RESPIRATORY_TRACT | 3 refills | Status: DC
  Filled 2020-09-20: qty 10.2, 30d supply, fill #0

## 2020-10-19 ENCOUNTER — Other Ambulatory Visit: Payer: Self-pay

## 2020-11-21 ENCOUNTER — Other Ambulatory Visit: Payer: Self-pay

## 2020-11-21 MED ORDER — BUDESONIDE-FORMOTEROL FUMARATE 160-4.5 MCG/ACT IN AERO
INHALATION_SPRAY | RESPIRATORY_TRACT | 3 refills | Status: DC
  Filled 2020-11-21: qty 10.2, 30d supply, fill #0

## 2020-11-22 ENCOUNTER — Other Ambulatory Visit: Payer: Self-pay

## 2020-11-26 ENCOUNTER — Emergency Department (HOSPITAL_COMMUNITY): Payer: BLUE CROSS/BLUE SHIELD

## 2020-11-26 ENCOUNTER — Encounter: Payer: Self-pay | Admitting: Emergency Medicine

## 2020-11-26 ENCOUNTER — Ambulatory Visit
Admission: EM | Admit: 2020-11-26 | Discharge: 2020-11-26 | Disposition: A | Discharge status: Home / Self Care | Payer: BLUE CROSS/BLUE SHIELD

## 2020-11-26 ENCOUNTER — Encounter (HOSPITAL_COMMUNITY): Payer: Self-pay | Admitting: Internal Medicine

## 2020-11-26 ENCOUNTER — Inpatient Hospital Stay (HOSPITAL_COMMUNITY)
Admission: EM | Admit: 2020-11-26 | Discharge: 2020-11-28 | DRG: 153 | Disposition: A | Discharge status: Home / Self Care | Payer: BLUE CROSS/BLUE SHIELD | Attending: Internal Medicine | Admitting: Internal Medicine

## 2020-11-26 DIAGNOSIS — Z9851 Tubal ligation status: Secondary | ICD-10-CM

## 2020-11-26 DIAGNOSIS — Z87891 Personal history of nicotine dependence: Secondary | ICD-10-CM

## 2020-11-26 DIAGNOSIS — Z825 Family history of asthma and other chronic lower respiratory diseases: Secondary | ICD-10-CM

## 2020-11-26 DIAGNOSIS — Z880 Allergy status to penicillin: Secondary | ICD-10-CM

## 2020-11-26 DIAGNOSIS — I82409 Acute embolism and thrombosis of unspecified deep veins of unspecified lower extremity: Secondary | ICD-10-CM | POA: Diagnosis present

## 2020-11-26 DIAGNOSIS — Z95828 Presence of other vascular implants and grafts: Secondary | ICD-10-CM

## 2020-11-26 DIAGNOSIS — R112 Nausea with vomiting, unspecified: Secondary | ICD-10-CM

## 2020-11-26 DIAGNOSIS — R600 Localized edema: Secondary | ICD-10-CM | POA: Diagnosis present

## 2020-11-26 DIAGNOSIS — Z886 Allergy status to analgesic agent status: Secondary | ICD-10-CM

## 2020-11-26 DIAGNOSIS — Z7951 Long term (current) use of inhaled steroids: Secondary | ICD-10-CM

## 2020-11-26 DIAGNOSIS — Z789 Other specified health status: Secondary | ICD-10-CM

## 2020-11-26 DIAGNOSIS — J45909 Unspecified asthma, uncomplicated: Secondary | ICD-10-CM | POA: Diagnosis present

## 2020-11-26 DIAGNOSIS — J36 Peritonsillar abscess: Principal | ICD-10-CM

## 2020-11-26 DIAGNOSIS — Z90710 Acquired absence of both cervix and uterus: Secondary | ICD-10-CM

## 2020-11-26 DIAGNOSIS — Z20822 Contact with and (suspected) exposure to covid-19: Secondary | ICD-10-CM | POA: Diagnosis present

## 2020-11-26 DIAGNOSIS — Z885 Allergy status to narcotic agent status: Secondary | ICD-10-CM

## 2020-11-26 DIAGNOSIS — Z86718 Personal history of other venous thrombosis and embolism: Secondary | ICD-10-CM

## 2020-11-26 DIAGNOSIS — Z79899 Other long term (current) drug therapy: Secondary | ICD-10-CM

## 2020-11-26 DIAGNOSIS — J029 Acute pharyngitis, unspecified: Secondary | ICD-10-CM | POA: Diagnosis not present

## 2020-11-26 DIAGNOSIS — R1310 Dysphagia, unspecified: Secondary | ICD-10-CM

## 2020-11-26 DIAGNOSIS — Z881 Allergy status to other antibiotic agents status: Secondary | ICD-10-CM

## 2020-11-26 LAB — CBC WITH DIFFERENTIAL/PLATELET
Abs Immature Granulocytes: 0.04 10*3/uL (ref 0.00–0.07)
Abs Immature Granulocytes: 0.09 10*3/uL — ABNORMAL HIGH (ref 0.00–0.07)
Basophils Absolute: 0 10*3/uL (ref 0.0–0.1)
Basophils Absolute: 0 10*3/uL (ref 0.0–0.1)
Basophils Relative: 0 %
Basophils Relative: 0 %
Eosinophils Absolute: 0 10*3/uL (ref 0.0–0.5)
Eosinophils Absolute: 0 10*3/uL (ref 0.0–0.5)
Eosinophils Relative: 0 %
Eosinophils Relative: 0 %
HCT: 39.6 % (ref 36.0–46.0)
HCT: 42.6 % (ref 36.0–46.0)
Hemoglobin: 13.1 g/dL (ref 12.0–15.0)
Hemoglobin: 13.7 g/dL (ref 12.0–15.0)
Immature Granulocytes: 0 %
Immature Granulocytes: 1 %
Lymphocytes Relative: 4 %
Lymphocytes Relative: 11 %
Lymphs Abs: 0.5 10*3/uL — ABNORMAL LOW (ref 0.7–4.0)
Lymphs Abs: 1.5 10*3/uL (ref 0.7–4.0)
MCH: 29.4 pg (ref 26.0–34.0)
MCH: 30.2 pg (ref 26.0–34.0)
MCHC: 33.1 g/dL (ref 30.0–36.0)
MCHC: 32.2 g/dL (ref 30.0–36.0)
MCV: 88.8 fL (ref 80.0–100.0)
MCV: 93.8 fL (ref 80.0–100.0)
Monocytes Absolute: 1.2 10*3/uL — ABNORMAL HIGH (ref 0.1–1.0)
Monocytes Absolute: 0.9 10*3/uL (ref 0.1–1.0)
Monocytes Relative: 9 %
Monocytes Relative: 6 %
Neutro Abs: 11 10*3/uL — ABNORMAL HIGH (ref 1.7–7.7)
Neutro Abs: 11.8 10*3/uL — ABNORMAL HIGH (ref 1.7–7.7)
Neutrophils Relative %: 87 %
Neutrophils Relative %: 82 %
Platelets: 243 10*3/uL (ref 150–400)
Platelets: 273 10*3/uL (ref 150–400)
RBC: 4.46 MIL/uL (ref 3.87–5.11)
RBC: 4.54 MIL/uL (ref 3.87–5.11)
RDW: 13.1 % (ref 11.5–15.5)
RDW: 14 % (ref 11.5–15.5)
WBC: 12.7 10*3/uL — ABNORMAL HIGH (ref 4.0–10.5)
WBC: 14.3 10*3/uL — ABNORMAL HIGH (ref 4.0–10.5)
nRBC: 0 % (ref 0.0–0.2)
nRBC: 0 % (ref 0.0–0.2)

## 2020-11-26 LAB — BASIC METABOLIC PANEL
Anion gap: 13 (ref 5–15)
BUN: 10 mg/dL (ref 6–20)
CO2: 21 mmol/L — ABNORMAL LOW (ref 22–32)
Calcium: 8.8 mg/dL — ABNORMAL LOW (ref 8.9–10.3)
Chloride: 101 mmol/L (ref 98–111)
Creatinine, Ser: 0.68 mg/dL (ref 0.44–1.00)
GFR, Estimated: >60 mL/min (ref >60)
Glucose, Bld: 88 mg/dL (ref 70–99)
Potassium: 3.4 mmol/L — ABNORMAL LOW (ref 3.5–5.1)
Sodium: 135 mmol/L (ref 135–145)

## 2020-11-26 LAB — LACTIC ACID, PLASMA
Lactic Acid, Venous: 1.1 mmol/L (ref 0.5–1.9)
Lactic Acid, Venous: 0.9 mmol/L (ref 0.5–1.9)

## 2020-11-26 LAB — GROUP A STREP BY PCR: Group A Strep by PCR: NOT DETECTED

## 2020-11-26 MED ORDER — KCL-LACTATED RINGERS-D5W 20 MEQ/L IV SOLN
INTRAVENOUS | Status: DC
  Filled 2020-11-26 (×6): qty 1000

## 2020-11-26 MED ORDER — ACETAMINOPHEN 325 MG PO TABS
650.0000 mg | ORAL_TABLET | Freq: Four times a day (QID) | ORAL | Status: DC | PRN
  Administered 2020-11-27 – 2020-11-28 (×3): 650 mg via ORAL
  Filled 2020-11-26 (×3): qty 2

## 2020-11-26 MED ORDER — HEPARIN SODIUM (PORCINE) 5000 UNIT/ML IJ SOLN
5000.0000 [IU] | Freq: Three times a day (TID) | INTRAMUSCULAR | Status: DC
  Administered 2020-11-27 – 2020-11-28 (×5): 5000 [IU] via SUBCUTANEOUS
  Filled 2020-11-26 (×5): qty 1

## 2020-11-26 MED ORDER — CLINDAMYCIN PHOSPHATE 600 MG/50ML IV SOLN
600.0000 mg | Freq: Once | INTRAVENOUS | Status: AC
  Administered 2020-11-26: 600 mg via INTRAVENOUS
  Filled 2020-11-26: qty 50

## 2020-11-26 MED ORDER — SODIUM CHLORIDE 0.9 % IV BOLUS
1000.0000 mL | Freq: Once | INTRAVENOUS | Status: AC
  Administered 2020-11-26: 1000 mL via INTRAVENOUS

## 2020-11-26 MED ORDER — ALBUTEROL SULFATE HFA 108 (90 BASE) MCG/ACT IN AERS
2.0000 | INHALATION_SPRAY | RESPIRATORY_TRACT | Status: DC | PRN
  Filled 2020-11-26: qty 6.7

## 2020-11-26 MED ORDER — MORPHINE SULFATE (PF) 4 MG/ML IV SOLN
4.0000 mg | Freq: Once | INTRAVENOUS | Status: AC
  Administered 2020-11-26: 4 mg via INTRAVENOUS
  Filled 2020-11-26: qty 1

## 2020-11-26 MED ORDER — IOHEXOL 300 MG/ML  SOLN
75.0000 mL | Freq: Once | INTRAMUSCULAR | Status: AC | PRN
  Administered 2020-11-26: 75 mL via INTRAVENOUS

## 2020-11-26 MED ORDER — MOMETASONE FURO-FORMOTEROL FUM 200-5 MCG/ACT IN AERO
2.0000 | INHALATION_SPRAY | Freq: Two times a day (BID) | RESPIRATORY_TRACT | Status: DC
  Administered 2020-11-27 – 2020-11-28 (×3): 2 via RESPIRATORY_TRACT
  Filled 2020-11-26 (×2): qty 8.8

## 2020-11-26 MED ORDER — ACETAMINOPHEN 650 MG RE SUPP: 650.0000 mg | Freq: Four times a day (QID) | RECTAL | Status: DC | PRN

## 2020-11-26 MED ORDER — CLINDAMYCIN PHOSPHATE 900 MG/50ML IV SOLN
900.0000 mg | Freq: Three times a day (TID) | INTRAVENOUS | Status: DC
  Administered 2020-11-27 – 2020-11-28 (×5): 900 mg via INTRAVENOUS
  Filled 2020-11-26 (×7): qty 50

## 2020-11-26 MED ORDER — ONDANSETRON HCL 4 MG/2ML IJ SOLN
4.0000 mg | Freq: Four times a day (QID) | INTRAMUSCULAR | Status: DC | PRN
  Administered 2020-11-26 – 2020-11-27 (×3): 4 mg via INTRAVENOUS
  Filled 2020-11-26 (×3): qty 2

## 2020-11-26 MED ORDER — ONDANSETRON HCL 4 MG/2ML IJ SOLN
4.0000 mg | Freq: Once | INTRAMUSCULAR | Status: AC
  Administered 2020-11-26: 4 mg via INTRAVENOUS
  Filled 2020-11-26: qty 2

## 2020-11-26 MED ORDER — DEXAMETHASONE SODIUM PHOSPHATE 10 MG/ML IJ SOLN
10.0000 mg | Freq: Once | INTRAMUSCULAR | Status: AC
  Administered 2020-11-26: 10 mg via INTRAVENOUS
  Filled 2020-11-26: qty 1

## 2020-11-26 NOTE — ED Triage Notes (Signed)
Sore throat and right ear pain starting 2 days prior. States it's hard for her to swallow due to pain. Has been managing with tylenol, unable to take ibuprofen. C/o headache now. Did not take any tylenol today.

## 2020-11-26 NOTE — ED Provider Notes (Addendum)
[facility]    CSN: [id number] Arrival date & time: 11/26/20  0850      History   Chief Complaint Chief Complaint  Patient presents with   Sore Throat   Otalgia    HPI Linda Dalton is a 45 y.o. female presenting with a sore throat for 2 days.  Medical history asthma.  Patient describes 2 days of sore throat, shortness of breath, difficulty handling secretions.  Pain radiates to her right ear.  Difficult to swallow due to the pain, and shortness of breath.  She states that she tried her albuterol inhaler with no improvement in the shortness of breath.  Has not monitored her temperature at home.  Last dose of Tylenol few hours ago, unable to take ibuprofen given anaphylaxis reaction to this.  HPI  Past Medical History:  Diagnosis Date   Asthma    DVT (deep venous thrombosis) (HCC)    Fibroid tumor    PONV (postoperative nausea and vomiting)    Presence of IVC filter    Sinusitis    Tonsillitis     Patient Active Problem List   Diagnosis Date Noted   Acute respiratory failure with hypoxia and hypercapnia ([place]) 07/13/2017   Asthma exacerbation 04/28/2016   Moderate persistent asthma with exacerbation 03/05/2016   Trimalleolar fracture of right ankle 02/02/2014   DVT (deep venous thrombosis) ([place]) 05/01/2013   Acute DVT (deep venous thrombosis) ([place]) 05/01/2013   ASTHMA 02/04/2007   G E REFLUX 02/04/2007    Past Surgical History:  Procedure Laterality Date   ABDOMINAL HYSTERECTOMY     BREAST SURGERY     ORIF ANKLE FRACTURE Right 02/02/2014   Procedure: OPEN REDUCTION INTERNAL FIXATION (ORIF) RIGHT TRIMALLEOLAR ANKLE FRACTURE;  Surgeon: Marianna Payment, MD;  Location: [facility];  Service: Orthopedics;  Laterality: Right;   TUBAL LIGATION      OB History   No obstetric history on file.      Home Medications    Prior to Admission medications   Medication Sig Start Date End Date Taking? Authorizing Provider  acetaminophen (TYLENOL) 500 MG  tablet Take 500-1,000 mg by mouth every 6 (six) hours as needed (for pain or headaches).    [provider]  albuterol (PROVENTIL HFA;VENTOLIN HFA) 108 (90 Base) MCG/ACT inhaler Inhale 2 puffs into the lungs every 6 (six) hours as needed for wheezing or shortness of breath.    [provider]  albuterol (PROVENTIL) (2.5 MG/3ML) 0.083% nebulizer solution Take 3 mLs (2.5 mg total) by nebulization every 6 (six) hours as needed for wheezing or shortness of breath. 01/15/18   Clent Demark, PA-C  albuterol (VENTOLIN HFA) 108 (90 Base) MCG/ACT inhaler Inhale 2 puffs into the lungs every 4 (four) hours as needed for Wheezing or Shortness of Breath. 06/04/20     albuterol (VENTOLIN HFA) 108 (90 Base) MCG/ACT inhaler Inhale 2 puffs into the lungs every 4 (four) hours as needed for Wheezing or Shortness of Breath. 08/14/20     budesonide-formoterol (SYMBICORT) 160-4.5 MCG/ACT inhaler Inhale 2 puffs into the lungs 2 (two) times daily. 12/07/17   Clent Demark, PA-C  budesonide-formoterol (SYMBICORT) 160-4.5 MCG/ACT inhaler INHALE 2 PUFFS INTO THE LUNGS 2 TIMES DAILY. 08/04/19 08/03/20  Christiano, Terance Hart, MD  budesonide-formoterol (SYMBICORT) 160-4.5 MCG/ACT inhaler Inhale 2 puffs into the lungs 2 (two) times daily. 06/06/20     budesonide-formoterol (SYMBICORT) 160-4.5 MCG/ACT inhaler Inhale 2 puffs into the lungs twice daily 08/14/20     budesonide-formoterol (  SYMBICORT) 160-4.5 MCG/ACT inhaler Inhale 2 puffs into the lungs 2 (two) times daily. 09/20/20     budesonide-formoterol (SYMBICORT) 160-4.5 MCG/ACT inhaler Inhale 2 puffs into the lungs twice daily 11/21/20     Dupilumab 300 MG/2ML SOSY Inject into the skin. 03/01/18   [provider]  fexofenadine (ALLEGRA) 180 MG tablet Take 180 mg by mouth daily.    [provider]  fluticasone (FLONASE) 50 MCG/ACT nasal spray Place 2 sprays into both nostrils daily. 05/14/17   Hedges, Dellis Filbert, PA-C  fluticasone (FLONASE) 50 MCG/ACT  nasal spray use 2 sprays in each nostril daily as directed 07/11/20     ipratropium-albuterol (DUONEB) 0.5-2.5 (3) MG/3ML SOLN Take 3 mLs by nebulization every 4 (four) hours as needed for up to 2 doses. 04/17/19   Tasia Catchings, Amy V, PA-C  metroNIDAZOLE (FLAGYL) 500 MG tablet Take 1 tab by mouth twice daily for bacterial vaginitis 06/14/20     montelukast (SINGULAIR) 10 MG tablet Take 1 tablet (10 mg total) by mouth daily. 08/11/17   Clent Demark, PA-C  montelukast (SINGULAIR) 10 MG tablet Take 1 tablet (10 mg total) by mouth nightly. 07/11/20     montelukast (SINGULAIR) 10 MG tablet Take 1 tablet (10 mg total) by mouth nightly. 08/14/20     Multiple Vitamins-Calcium (ONE-A-DAY WOMENS PO) Take 1 tablet by mouth daily.    [provider]  Olopatadine HCl 0.2 % SOLN 1 drop in each eye once daily as needed. 07/11/20     predniSONE (DELTASONE) 50 MG tablet Take 1 tablet (50 mg total) by mouth daily with breakfast. 04/20/19   Tasia Catchings, Amy V, PA-C  rivaroxaban (XARELTO) 20 MG TABS tablet Take 1 tablet (20 mg total) by mouth daily with supper for 30 days. 03/23/18 04/22/18  Kerin Perna, NP  Spacer/Aero-Holding Chambers (AEROCHAMBER PLUS FLO-VU MEDIUM) MISC use as direected 08/14/20     Spacer/Aero-Holding Chambers (AEROCHAMBER W/FLOWSIGNAL) inhaler Use as directed with all inhalers. 01/15/18   [provider]  Spacer/Aero-Holding Chambers (EASIVENT) inhaler Use as directed with all inhalers. 06/05/20       Family History Family History  Problem Relation Age of Onset   Congestive Heart Failure Mother    Diabetes Mother    Asthma Mother    Hypertension Mother    Asthma Father    Sleep apnea Father    Hypercholesterolemia Father    Heart murmur Sister    Heart disease Brother    Cancer Other    Congestive Heart Failure Other     Social History Social History   Tobacco Use   Smoking status: Former    Packs/day: 1.00    Years: 8.00    Pack years: 8.00    Types: Cigarettes    Quit date:  07/14/2014    Years since quitting: 6.3   Smokeless tobacco: Never  Vaping Use   Vaping Use: Never used  Substance Use Topics   Alcohol use: Not Currently    Alcohol/week: 0.0 standard drinks   Drug use: No     Allergies   Clarithromycin, Codeine, Ibuprofen, Naproxen, Nsaids, and Penicillins   Review of Systems Review of Systems  Constitutional:  Positive for chills and fever. Negative for appetite change.  HENT:  Positive for sore throat, trouble swallowing and voice change. Negative for congestion, ear pain, rhinorrhea, sinus pressure and sinus pain.   Eyes:  Negative for redness and visual disturbance.  Respiratory:  Positive for shortness of breath. Negative for cough, chest tightness and  wheezing.   Cardiovascular:  Negative for chest pain and palpitations.  Gastrointestinal:  Negative for abdominal pain, constipation, diarrhea, nausea and vomiting.  Genitourinary:  Negative for dysuria, frequency and urgency.  Musculoskeletal:  Negative for myalgias.  Neurological:  Negative for dizziness, weakness and headaches.  Psychiatric/Behavioral:  Negative for confusion.   All other systems reviewed and are negative.   Physical Exam Triage Vital Signs ED Triage Vitals [11/26/20 1050]  Enc Vitals Group     BP (!) 160/78     Pulse Rate 95     Resp 16     Temp (!) 101.1 F (38.4 C)     Temp Source Oral     SpO2 97 %     Weight      Height      Head Circumference      Peak Flow      Pain Score 10     Pain Loc      Pain Edu?      Excl. in [place]?    No data found.  Updated Vital Signs BP (!) 160/78 (BP Location: Left Arm)   Pulse 95   Temp (!) 101.1 F (38.4 C) (Oral)   Resp 16   LMP 03/15/2013   SpO2 97%   Visual Acuity Right Eye Distance:   Left Eye Distance:   Bilateral Distance:    Right Eye Near:   Left Eye Near:    Bilateral Near:     Physical Exam Vitals reviewed.  Constitutional:      General: She is not in acute distress.    Appearance: Normal  appearance. She is ill-appearing.  HENT:     Head: Normocephalic and atraumatic.     Mouth/Throat:     Pharynx: Posterior oropharyngeal erythema present.     Tonsils: Tonsillar abscess present. No tonsillar exudate. 3+ on the right. 2+ on the left.     Comments: R tonsil is 3+, L tonsil 2+. Tonsils are touching. Hot potato voice. Patient leaning forward while speaking. Difficulty handling secretions.  Pulmonary:     Effort: Pulmonary effort is normal.  Lymphadenopathy:     Cervical: Cervical adenopathy present.     Right cervical: Superficial cervical adenopathy present.  Neurological:     General: No focal deficit present.     Mental Status: She is alert and oriented to person, place, and time.  Psychiatric:        Mood and Affect: Mood normal.        Behavior: Behavior normal.        Thought Content: Thought content normal.        Judgment: Judgment normal.     UC Treatments / Results  Labs (all labs ordered are listed, but only abnormal results are displayed) Labs Reviewed - No data to display  EKG   Radiology No results found.  Procedures Procedures (including critical care time)  Medications Ordered in UC Medications - No data to display  Initial Impression / Assessment and Plan / UC Course  I have reviewed the triage vital signs and the nursing notes.  Pertinent labs & imaging results that were available during my care of the patient were reviewed by me and considered in my medical decision making (see chart for details).     This patient is a very pleasant 45 y.o. year old female presenting with peritonsillar abscess. Febrile at 101.1 but nontachy. Abscess is fairly significant and she does have voice changes/ difficulty handling secretions. Sent to  ED for further workup, she declines transport via EMS in favor of personal vehicle.   Level 5 as this patient was admitted.  Final Clinical Impressions(s) / UC Diagnoses   Final diagnoses:  Peritonsillar  abscess     Discharge Instructions      -Sent to ED via personal vehicle. She understands she has a life threatening condition and states she will head straight to the ED.   ED Prescriptions   None    PDMP not reviewed this encounter.   Hazel Sams, PA-C 11/26/20 1117    Hazel Sams, PA-C 11/27/20 [phone]

## 2020-11-26 NOTE — H&P (Addendum)
History and Physical    Linda Dalton WJX:[id number] DOB: [birth year] DOA: 11/26/2020  PCP: Kerin Perna, NP  Patient coming from: Home.  Chief Complaint: Throat pain.  HPI: Linda Dalton is a 45 y.o. 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  Pain is mostly on the right side of the throat.  Multiple episodes of vomiting but no abdominal pain or diarrhea.  Denies any blood in the vomitus.  Patient states that she had recently taken a cat.  ED Course: In the ER CT scan shows features concerning for tonsillitis pharyngitis and right-sided peritonsillar abscess.  ER physician did discuss with on-call ENT surgeon Dr. Marcelline Deist who at this time felt that abscess is not large enough to be drained.  To be on antibiotics and to reconsult ENT if symptoms do not improve.  Patient was started on IV clindamycin fluids and admitted for further work-up.  Labs show mild hypokalemia and leukocytosis of 14.3 strep throat was negative.  COVID test is pending.  Review of Systems: As per HPI, rest all negative.   Past Medical History:  Diagnosis Date   Asthma    DVT (deep venous thrombosis) (HCC)    Fibroid tumor    PONV (postoperative nausea and vomiting)    Presence of IVC filter    Sinusitis    Tonsillitis     Past Surgical History:  Procedure Laterality Date   ABDOMINAL HYSTERECTOMY     BREAST SURGERY     ORIF ANKLE FRACTURE Right 02/02/2014   Procedure: OPEN REDUCTION INTERNAL FIXATION (ORIF) RIGHT TRIMALLEOLAR ANKLE FRACTURE;  Surgeon: Marianna Payment, MD;  Location: [facility];  Service: Orthopedics;  Laterality: Right;   TUBAL LIGATION       reports that she quit smoking about 6 years ago. Her smoking use included cigarettes. She has a 8.00 pack-year smoking history. She has never used smokeless  tobacco. She reports that she does not currently use alcohol. She reports that she does not use drugs.  Allergies  Allergen Reactions   Aspirin Shortness Of Breath   Clarithromycin Anaphylaxis, Hives, Shortness Of Breath, Swelling and Other (See Comments)    Mouth swells   Codeine Hives, Rash and Swelling   Ibuprofen Anaphylaxis and Shortness Of Breath   Naproxen Anaphylaxis and Nausea And Vomiting   Nsaids Anaphylaxis   Penicillins Swelling and Rash    Has patient had a PCN reaction causing immediate rash, facial/tongue/throat swelling, SOB or lightheadedness with hypotension: Yes Has patient had a PCN reaction causing severe rash involving mucus membranes or skin necrosis: Unk Has patient had a PCN reaction that required hospitalization: Yes Has patient had a PCN reaction occurring within the last 10 years: Yes If all of the above answers are "NO", then may proceed with Cephalosporin use.     Family History  Problem Relation Age of Onset   Congestive Heart Failure Mother    Diabetes Mother    Asthma Mother    Hypertension Mother    Asthma Father    Sleep apnea Father    Hypercholesterolemia Father    Heart murmur Sister    Heart disease Brother    Cancer Other    Congestive Heart Failure Other     Prior to Admission medications   Medication Sig Start Date End Date Taking? Authorizing Provider  acetaminophen (TYLENOL) 500  MG tablet Take 500-1,000 mg by mouth every 6 (six) hours as needed (for pain or headaches).   Yes [provider]  albuterol (PROVENTIL) (2.5 MG/3ML) 0.083% nebulizer solution Take 3 mLs (2.5 mg total) by nebulization every 6 (six) hours as needed for wheezing or shortness of breath. 01/15/18  Yes Clent Demark, PA-C  albuterol (VENTOLIN HFA) 108 (90 Base) MCG/ACT inhaler Inhale 2 puffs into the lungs every 4 (four) hours as needed for Wheezing or Shortness of Breath. 06/04/20  Yes   budesonide-formoterol (SYMBICORT) 160-4.5 MCG/ACT inhaler Inhale  2 puffs into the lungs 2 (two) times daily. 12/07/17  Yes Clent Demark, PA-C  Dupilumab 300 MG/2ML SOSY Inject 300 mg into the skin every 14 (fourteen) days. 03/01/18  Yes [provider]  fexofenadine (ALLEGRA) 180 MG tablet Take 180 mg by mouth daily as needed for allergies.   Yes [provider]  fluticasone (FLONASE) 50 MCG/ACT nasal spray use 2 sprays in each nostril daily as directed Patient taking differently: Place 2 sprays into both nostrils daily as needed for allergies. 07/11/20  Yes   montelukast (SINGULAIR) 10 MG tablet Take 1 tablet (10 mg total) by mouth daily. 08/11/17  Yes Clent Demark, PA-C  Multiple Vitamins-Calcium (ONE-A-DAY WOMENS PO) Take 1 tablet by mouth daily.   Yes [provider]  Spacer/Aero-Holding Chambers (EASIVENT) inhaler Use as directed with all inhalers. 06/05/20  Yes   albuterol (VENTOLIN HFA) 108 (90 Base) MCG/ACT inhaler Inhale 2 puffs into the lungs every 4 (four) hours as needed for Wheezing or Shortness of Breath. 08/14/20     budesonide-formoterol (SYMBICORT) 160-4.5 MCG/ACT inhaler Inhale 2 puffs into the lungs 2 (two) times daily. 06/06/20     budesonide-formoterol (SYMBICORT) 160-4.5 MCG/ACT inhaler Inhale 2 puffs into the lungs twice daily 08/14/20     budesonide-formoterol (SYMBICORT) 160-4.5 MCG/ACT inhaler Inhale 2 puffs into the lungs 2 (two) times daily. 09/20/20     budesonide-formoterol (SYMBICORT) 160-4.5 MCG/ACT inhaler Inhale 2 puffs into the lungs twice daily 11/21/20     ipratropium-albuterol (DUONEB) 0.5-2.5 (3) MG/3ML SOLN Take 3 mLs by nebulization every 4 (four) hours as needed for up to 2 doses. Patient not taking: Reported on 11/26/2020 04/17/19   Ok Edwards, PA-C  metroNIDAZOLE (FLAGYL) 500 MG tablet Take 1 tab by mouth twice daily for bacterial vaginitis Patient not taking: Reported on 11/26/2020 06/14/20     montelukast (SINGULAIR) 10 MG tablet Take 1 tablet (10 mg total) by mouth nightly. 07/11/20      montelukast (SINGULAIR) 10 MG tablet Take 1 tablet (10 mg total) by mouth nightly. 08/14/20     Olopatadine HCl 0.2 % SOLN 1 drop in each eye once daily as needed. 07/11/20     predniSONE (DELTASONE) 50 MG tablet Take 1 tablet (50 mg total) by mouth daily with breakfast. Patient not taking: Reported on 11/26/2020 04/20/19   Ok Edwards, PA-C  rivaroxaban (XARELTO) 20 MG TABS tablet Take 1 tablet (20 mg total) by mouth daily with supper for 30 days. Patient not taking: Reported on 11/26/2020 03/23/18 04/22/18  Kerin Perna, NP  Spacer/Aero-Holding Chambers (AEROCHAMBER PLUS FLO-VU MEDIUM) MISC use as direected 08/14/20       Physical Exam: Constitutional: Moderately built and nourished. Vitals:   11/26/20 1356 11/26/20 1830 11/26/20 1900 11/26/20 2045  BP: 136/83 (!) 141/76 125/83 125/84  Pulse: 98 (!) 101 94 90  Resp: 20 16 (!) 24 18  Temp: 99.8 F (37.7 C)  TempSrc: Oral     SpO2: 100% 96% 94% 94%   Eyes: Anicteric no pallor. ENMT: Not able to clearly visualize the uvula.  Patient is able to stretch her tongue out. Neck: Tenderness in the right jugulodigastric area.  No stridor. Respiratory: No rhonchi or crepitations. Cardiovascular: S1-S2 heard. Abdomen: Soft nontender bowel sound present. Musculoskeletal: No edema. Skin: No rash. Neurologic: Alert awake oriented time place and person.  Moves all extremities. Psychiatric: Appears normal.  Normal affect.   Labs on Admission: I have personally reviewed following labs and imaging studies  CBC: Recent Labs  Lab 11/26/20 1306 11/26/20 1342  WBC 12.7* 14.3*  NEUTROABS 11.0* 11.8*  HGB 13.1 13.7  HCT 39.6 42.6  MCV 88.8 93.8  PLT 243 625   Basic Metabolic Panel: Recent Labs  Lab 11/26/20 1342  NA 135  K 3.4*  CL 101  CO2 21*  GLUCOSE 88  BUN 10  CREATININE 0.68  CALCIUM 8.8*   GFR: CrCl cannot be calculated (Unknown ideal weight.). Liver Function Tests: No results for input(s): AST, ALT, ALKPHOS, BILITOT,  PROT, ALBUMIN in the last 168 hours. No results for input(s): LIPASE, AMYLASE in the last 168 hours. No results for input(s): AMMONIA in the last 168 hours. Coagulation Profile: No results for input(s): INR, PROTIME in the last 168 hours. Cardiac Enzymes: No results for input(s): CKTOTAL, CKMB, CKMBINDEX, TROPONINI in the last 168 hours. BNP (last 3 results) No results for input(s): PROBNP in the last 8760 hours. HbA1C: No results for input(s): HGBA1C in the last 72 hours. CBG: No results for input(s): GLUCAP in the last 168 hours. Lipid Profile: No results for input(s): CHOL, HDL, LDLCALC, TRIG, CHOLHDL, LDLDIRECT in the last 72 hours. Thyroid Function Tests: No results for input(s): TSH, T4TOTAL, FREET4, T3FREE, THYROIDAB in the last 72 hours. Anemia Panel: No results for input(s): VITAMINB12, FOLATE, FERRITIN, TIBC, IRON, RETICCTPCT in the last 72 hours. Urine analysis:    Component Value Date/Time   COLORURINE YELLOW 07/13/2017 2301   APPEARANCEUR CLOUDY (A) 07/13/2017 2301   LABSPEC 1.030 07/13/2017 2301   PHURINE 5.0 07/13/2017 2301   GLUCOSEU NEGATIVE 07/13/2017 2301   HGBUR MODERATE (A) 07/13/2017 2301   BILIRUBINUR NEGATIVE 07/13/2017 2301   KETONESUR NEGATIVE 07/13/2017 2301   PROTEINUR NEGATIVE 07/13/2017 2301   UROBILINOGEN 0.2 11/02/2013 1030   NITRITE NEGATIVE 07/13/2017 2301   LEUKOCYTESUR NEGATIVE 07/13/2017 2301   Sepsis Labs: @LABRCNTIP (procalcitonin:4,lacticidven:4) ) Recent Results (from the past 240 hour(s))  Group A Strep by PCR     Status: None   Collection Time: 11/26/20  6:02 PM   Specimen: Throat; Sterile Swab  Result Value Ref Range Status   Group A Strep by PCR NOT DETECTED NOT DETECTED Final    Comment: Performed at [facility], [place] [address], [place], [place] [address]     Radiological Exams on Admission: DG Chest 2 View  Result Date: 11/26/2020 CLINICAL DATA:  Shortness of breath, weakness, sore throat EXAM: CHEST - 2 VIEW  COMPARISON:  Chest radiograph 04/17/2019 FINDINGS: The cardiomediastinal silhouette is normal. The lungs are clear, without focal consolidation or pulmonary edema. There is no pleural effusion or pneumothorax. There is no acute osseous abnormality. IMPRESSION: No radiographic evidence of acute cardiopulmonary process. Electronically Signed   By: Valetta Mole M.D.   On: 11/26/2020 13:26   CT Soft Tissue Neck W Contrast  Result Date: 11/26/2020 CLINICAL DATA:  Initial evaluation for acute sore throat, unable to swallow. EXAM: CT NECK  WITH CONTRAST TECHNIQUE: Multidetector CT imaging of the neck was performed using the standard protocol following the bolus administration of intravenous contrast. CONTRAST:  79mL OMNIPAQUE IOHEXOL 300 MG/ML  SOLN COMPARISON:  Prior CT from 06/24/2013. FINDINGS: Pharynx and larynx: Oral cavity within normal limits. Palatine tonsils are hypertrophied and enhancing bilaterally, consistent with acute tonsillitis. Superimposed hypodense collection along the lateral margin of the right tonsil measures 1.7 x 0.7 x 1.3 cm, consistent with a tonsillar/peritonsillar abscess (series 11, image 46). Associated mild mucosal edema within the adjacent oropharynx. Slight asymmetric fullness of the right nasopharynx without discrete mass. Mild induration within the adjacent right parapharyngeal fat. No retropharyngeal collection or swelling. Lingual tonsils are hypertrophied as well and fill the vallecula. No convincing evidence for associated epiglottitis. Remainder of the hypopharynx and supraglottic larynx within normal limits. Glottis normal. Subglottic airway patent clear. Salivary glands: Salivary glands including the parotid and submandibular glands are within normal limits. Thyroid: Normal. Lymph nodes: Mildly prominent bilateral level II lymph nodes measure up to 1.3 cm, likely reactive. No other enlarged or pathologic adenopathy within the neck. Vascular: Normal intravascular enhancement  seen throughout the neck. Limited intracranial: Unremarkable. Visualized orbits: Unremarkable. Mastoids and visualized paranasal sinuses: Paranasal sinuses are largely clear. Mastoid air cells and middle ear cavities are well pneumatized and free of fluid. Skeleton: No discrete or worrisome osseous lesions. No significant spondylosis for age. Upper chest: Visualized upper chest demonstrates no acute finding. Partially visualized lung apices are clear. Other: None. IMPRESSION: 1. Findings consistent with acute tonsillitis/pharyngitis with superimposed 1.7 x 0.7 x 1.3 cm right tonsillar/peritonsillar abscess as above. 2. Mildly prominent bilateral level II lymph nodes, likely reactive. Electronically Signed   By: Jeannine Boga M.D.   On: 11/26/2020 19:37   CT US GUIDE VASC ACCESS LT NO REPORT  Result Date: 11/26/2020 There is no Radiologist interpretation  for this exam.    Assessment/Plan Principal Problem:   Peritonsillar abscess Active Problems:   Asthma    Right-sided peritonsillar abscess with tonsillitis pharyngitis -given that patient is having intractable nausea vomiting with difficulty swallowing we will admit for IV antibiotics.  Patient did receive 1 dose of Decadron.  ER physician did discuss with on-call ENT surgeon Dr. Arlana Pouch when this time felt that there is no need for any surgical drainage since the abscess was mild but to reconsult ENT if symptoms does not improve.  We will keep patient on IV fluids.  IV clindamycin.  Monospot test is pending. Nausea vomiting -abdomen appears benign denies any abdominal pain.  Will check LFTs and continue to observe.  As needed antiemetics.  May be related to the peritonsillar abscess tonsillitis. Asthma not acutely wheezing.  Continue inhalers. Prior history of left lower extremity DVT.  COVID test is pending.   DVT prophylaxis: Heparin. Code Status: Full code. Family Communication: Discussed with patient. Disposition Plan:  Home. Consults called: ER physician discussed with ENT surgeon Dr. Marcelline Deist. Admission status: Observation.   Rise Patience MD [facility] Pager [phone].  If 7PM-7AM, please contact night-coverage [email or web address] Password [facility]  11/26/2020, 9:58 PM

## 2020-11-26 NOTE — ED Provider Notes (Signed)
Initial MSE note entered into this patient's chart on 11/26/2020 at 1302 by mistake. Note intended for different patient.   Muhsin Doris [address]    Estill Cotta 11/26/20 1404    Lucrezia Starch, MD 11/27/20 2242

## 2020-11-26 NOTE — ED Provider Notes (Addendum)
Emergency Medicine Provider Triage Evaluation Note  Linda Dalton , a 45 y.o. female  was evaluated in triage.  Pt complains of sore throat and difficulty swallowing since Saturday. Febrile at home to 102F. History of asthma, concerned about an asthma attack today.   Review of Systems  Positive: Sore throat, dysphagia, nausea, vomiting, fever Negative: Shortness of breath  Physical Exam  BP 136/83   Pulse 98   Temp 99.8 F (37.7 C) (Oral)   Resp 20   LMP 03/15/2013   SpO2 100%  Gen:   Awake, no distress   Resp:  Normal effort  MSK:   Moves extremities without difficulty  Other:  Not tolerating own secretions, spitting in emesis bag  Bilateral peristonsillar swelling, worse on right side  Medical Decision Making  Medically screening exam initiated at 2:15 PM.  Appropriate orders placed.  Linda Dalton was informed that the remainder of the evaluation will be completed by another provider, this initial triage assessment does not replace that evaluation, and the importance of remaining in the ED until their evaluation is complete.  Concern for PTA, ordered steroids and empiric abx   Kateri Plummer, PA-C 11/26/20 1438    Lucrezia Starch, MD 11/27/20 2255

## 2020-11-26 NOTE — ED Provider Notes (Signed)
[facility] EMERGENCY DEPARTMENT Provider Note   CSN: [id number] Arrival date & time: 11/26/20  1129     History Chief Complaint  Patient presents with   Sore Throat    Linda Dalton is a 45 y.o. female presents to the emergency department with c/o sore throat x2 days.  She reports difficulty swallowing and fevers.  In the last 24 hours has been able to swallow at all.  No treatments prior to arrival.  No alleviating factors.  Patient reports that attempting to swallow or talk makes the pain in her throat significantly worse.  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  No shortness of breath or increased work of breathing today.  The history is provided by the patient and medical records. No language interpreter was used.      Past Medical History:  Diagnosis Date   Asthma    DVT (deep venous thrombosis) (HCC)    Fibroid tumor    PONV (postoperative nausea and vomiting)    Presence of IVC filter    Sinusitis    Tonsillitis     Patient Active Problem List   Diagnosis Date Noted   Peritonsillar abscess 11/26/2020   Acute respiratory failure with hypoxia and hypercapnia (HCC) 07/13/2017   Asthma exacerbation 04/28/2016   Moderate persistent asthma with exacerbation 03/05/2016   Trimalleolar fracture of right ankle 02/02/2014   DVT (deep venous thrombosis) ([place]) 05/01/2013   Acute DVT (deep venous thrombosis) ([place]) 05/01/2013   ASTHMA 02/04/2007   G E REFLUX 02/04/2007    Past Surgical History:  Procedure Laterality Date   ABDOMINAL HYSTERECTOMY     BREAST SURGERY     ORIF ANKLE FRACTURE Right 02/02/2014   Procedure: OPEN REDUCTION INTERNAL FIXATION (ORIF) RIGHT TRIMALLEOLAR ANKLE FRACTURE;  Surgeon: Marianna Payment, MD;  Location: [place];  Service: Orthopedics;  Laterality: Right;   TUBAL LIGATION       OB History   No obstetric history on file.     Family History  Problem Relation Age of Onset   Congestive  Heart Failure Mother    Diabetes Mother    Asthma Mother    Hypertension Mother    Asthma Father    Sleep apnea Father    Hypercholesterolemia Father    Heart murmur Sister    Heart disease Brother    Cancer Other    Congestive Heart Failure Other     Social History   Tobacco Use   Smoking status: Former    Packs/day: 1.00    Years: 8.00    Pack years: 8.00    Types: Cigarettes    Quit date: 07/14/2014    Years since quitting: 6.3   Smokeless tobacco: Never  Vaping Use   Vaping Use: Never used  Substance Use Topics   Alcohol use: Not Currently    Alcohol/week: 0.0 standard drinks   Drug use: No    Home Medications Prior to Admission medications   Medication Sig Start Date End Date Taking? Authorizing Provider  acetaminophen (TYLENOL) 500 MG tablet Take 500-1,000 mg by mouth every 6 (six) hours as needed (for pain or headaches).    [provider]  albuterol (PROVENTIL HFA;VENTOLIN HFA) 108 (90 Base) MCG/ACT inhaler Inhale 2 puffs into the lungs every 6 (six) hours as needed for wheezing or shortness of breath.    [provider]  albuterol (PROVENTIL) (2.5 MG/3ML) 0.083% nebulizer solution Take 3 mLs (2.5 mg total)  by nebulization every 6 (six) hours as needed for wheezing or shortness of breath. 01/15/18   Clent Demark, PA-C  albuterol (VENTOLIN HFA) 108 (90 Base) MCG/ACT inhaler Inhale 2 puffs into the lungs every 4 (four) hours as needed for Wheezing or Shortness of Breath. 06/04/20     albuterol (VENTOLIN HFA) 108 (90 Base) MCG/ACT inhaler Inhale 2 puffs into the lungs every 4 (four) hours as needed for Wheezing or Shortness of Breath. 08/14/20     budesonide-formoterol (SYMBICORT) 160-4.5 MCG/ACT inhaler Inhale 2 puffs into the lungs 2 (two) times daily. 12/07/17   Clent Demark, PA-C  budesonide-formoterol (SYMBICORT) 160-4.5 MCG/ACT inhaler INHALE 2 PUFFS INTO THE LUNGS 2 TIMES DAILY. 08/04/19 08/03/20  Christiano, Terance Hart, MD   budesonide-formoterol (SYMBICORT) 160-4.5 MCG/ACT inhaler Inhale 2 puffs into the lungs 2 (two) times daily. 06/06/20     budesonide-formoterol (SYMBICORT) 160-4.5 MCG/ACT inhaler Inhale 2 puffs into the lungs twice daily 08/14/20     budesonide-formoterol (SYMBICORT) 160-4.5 MCG/ACT inhaler Inhale 2 puffs into the lungs 2 (two) times daily. 09/20/20     budesonide-formoterol (SYMBICORT) 160-4.5 MCG/ACT inhaler Inhale 2 puffs into the lungs twice daily 11/21/20     Dupilumab 300 MG/2ML SOSY Inject into the skin. 03/01/18   [provider]  fexofenadine (ALLEGRA) 180 MG tablet Take 180 mg by mouth daily.    [provider]  fluticasone (FLONASE) 50 MCG/ACT nasal spray Place 2 sprays into both nostrils daily. 05/14/17   Hedges, Dellis Filbert, PA-C  fluticasone (FLONASE) 50 MCG/ACT nasal spray use 2 sprays in each nostril daily as directed 07/11/20     ipratropium-albuterol (DUONEB) 0.5-2.5 (3) MG/3ML SOLN Take 3 mLs by nebulization every 4 (four) hours as needed for up to 2 doses. 04/17/19   Tasia Catchings, Amy V, PA-C  metroNIDAZOLE (FLAGYL) 500 MG tablet Take 1 tab by mouth twice daily for bacterial vaginitis 06/14/20     montelukast (SINGULAIR) 10 MG tablet Take 1 tablet (10 mg total) by mouth daily. 08/11/17   Clent Demark, PA-C  montelukast (SINGULAIR) 10 MG tablet Take 1 tablet (10 mg total) by mouth nightly. 07/11/20     montelukast (SINGULAIR) 10 MG tablet Take 1 tablet (10 mg total) by mouth nightly. 08/14/20     Multiple Vitamins-Calcium (ONE-A-DAY WOMENS PO) Take 1 tablet by mouth daily.    [provider]  Olopatadine HCl 0.2 % SOLN 1 drop in each eye once daily as needed. 07/11/20     predniSONE (DELTASONE) 50 MG tablet Take 1 tablet (50 mg total) by mouth daily with breakfast. 04/20/19   Tasia Catchings, Amy V, PA-C  rivaroxaban (XARELTO) 20 MG TABS tablet Take 1 tablet (20 mg total) by mouth daily with supper for 30 days. 03/23/18 04/22/18  Kerin Perna, NP  Spacer/Aero-Holding Chambers (AEROCHAMBER  PLUS FLO-VU MEDIUM) MISC use as direected 08/14/20     Spacer/Aero-Holding Chambers (AEROCHAMBER W/FLOWSIGNAL) inhaler Use as directed with all inhalers. 01/15/18   [provider]  Spacer/Aero-Holding Chambers (EASIVENT) inhaler Use as directed with all inhalers. 06/05/20       Allergies    Clarithromycin, Codeine, Ibuprofen, Naproxen, Nsaids, and Penicillins  Review of Systems   Review of Systems  Constitutional:  Positive for fatigue and fever. Negative for appetite change, diaphoresis and unexpected weight change.  HENT:  Positive for sore throat and trouble swallowing. Negative for mouth sores.   Eyes:  Negative for visual disturbance.  Respiratory:  Negative for cough, chest tightness, shortness of breath and  wheezing.   Cardiovascular:  Negative for chest pain.  Gastrointestinal:  Positive for vomiting. Negative for abdominal pain, constipation, diarrhea and nausea.  Endocrine: Negative for polydipsia, polyphagia and polyuria.  Genitourinary:  Negative for dysuria, frequency, hematuria and urgency.  Musculoskeletal:  Negative for back pain and neck stiffness.  Skin:  Negative for rash.  Allergic/Immunologic: Negative for immunocompromised state.  Neurological:  Negative for syncope, light-headedness and headaches.  Hematological:  Does not bruise/bleed easily.  Psychiatric/Behavioral:  Negative for sleep disturbance. The patient is not nervous/anxious.    Physical Exam Updated Vital Signs BP 125/83   Pulse 94   Temp 99.8 F (37.7 C) (Oral)   Resp (!) 24   LMP 03/15/2013   SpO2 94%   Physical Exam Vitals and nursing note reviewed.  Constitutional:      General: She is not in acute distress.    Appearance: She is ill-appearing. She is not diaphoretic.     Comments: Pt gagging and spitting into a bag  HENT:     Head: Normocephalic.     Jaw: Trismus present.     Nose: Nose normal.     Mouth/Throat:     Mouth: Mucous membranes are moist.     Pharynx: Pharyngeal  swelling, oropharyngeal exudate, posterior oropharyngeal erythema and uvula swelling present.     Comments: Bilateral tonsillar and peritonsillar pillar swelling.  Right greater than left with deviation of the uvula. Difficulty speaking with muffled sounds. Eyes:     General: No scleral icterus.    Conjunctiva/sclera: Conjunctivae normal.  Cardiovascular:     Rate and Rhythm: Normal rate and regular rhythm.     Pulses: Normal pulses.          Radial pulses are 2+ on the right side and 2+ on the left side.  Pulmonary:     Effort: No tachypnea, accessory muscle usage, prolonged expiration, respiratory distress or retractions.     Breath sounds: No stridor.     Comments: Equal chest rise. No increased work of breathing. Abdominal:     General: There is no distension.     Palpations: Abdomen is soft.     Tenderness: There is no abdominal tenderness. There is no guarding or rebound.  Musculoskeletal:     Cervical back: Normal range of motion.     Comments: Moves all extremities equally and without difficulty.  Skin:    General: Skin is warm and dry.     Capillary Refill: Capillary refill takes less than 2 seconds.  Neurological:     Mental Status: She is alert.     GCS: GCS eye subscore is 4. GCS verbal subscore is 5. GCS motor subscore is 6.     Comments: Speech is clear and goal oriented.  Psychiatric:        Mood and Affect: Mood normal.    ED Results / Procedures / Treatments   Labs (all labs ordered are listed, but only abnormal results are displayed) Labs Reviewed  CBC WITH DIFFERENTIAL/PLATELET - Abnormal; Notable for the following components:      Result Value   WBC 12.7 (*)    Neutro Abs 11.0 (*)    Lymphs Abs 0.5 (*)    Monocytes Absolute 1.2 (*)    All other components within normal limits  BASIC METABOLIC PANEL - Abnormal; Notable for the following components:   Potassium 3.4 (*)    CO2 21 (*)    Calcium 8.8 (*)    All other components within  normal limits  CBC  WITH DIFFERENTIAL/PLATELET - Abnormal; Notable for the following components:   WBC 14.3 (*)    Neutro Abs 11.8 (*)    Abs Immature Granulocytes 0.09 (*)    All other components within normal limits  GROUP A STREP BY PCR  BASIC METABOLIC PANEL  MONONUCLEOSIS SCREEN  LACTIC ACID, PLASMA  LACTIC ACID, PLASMA  URINALYSIS, ROUTINE W REFLEX MICROSCOPIC    EKG None  Radiology DG Chest 2 View  Result Date: 11/26/2020 CLINICAL DATA:  Shortness of breath, weakness, sore throat EXAM: CHEST - 2 VIEW COMPARISON:  Chest radiograph 04/17/2019 FINDINGS: The cardiomediastinal silhouette is normal. The lungs are clear, without focal consolidation or pulmonary edema. There is no pleural effusion or pneumothorax. There is no acute osseous abnormality. IMPRESSION: No radiographic evidence of acute cardiopulmonary process. Electronically Signed   By: Valetta Mole M.D.   On: 11/26/2020 13:26   CT Soft Tissue Neck W Contrast  Result Date: 11/26/2020 CLINICAL DATA:  Initial evaluation for acute sore throat, unable to swallow. EXAM: CT NECK WITH CONTRAST TECHNIQUE: Multidetector CT imaging of the neck was performed using the standard protocol following the bolus administration of intravenous contrast. CONTRAST:  27mL OMNIPAQUE IOHEXOL 300 MG/ML  SOLN COMPARISON:  Prior CT from 06/24/2013. FINDINGS: Pharynx and larynx: Oral cavity within normal limits. Palatine tonsils are hypertrophied and enhancing bilaterally, consistent with acute tonsillitis. Superimposed hypodense collection along the lateral margin of the right tonsil measures 1.7 x 0.7 x 1.3 cm, consistent with a tonsillar/peritonsillar abscess (series 11, image 46). Associated mild mucosal edema within the adjacent oropharynx. Slight asymmetric fullness of the right nasopharynx without discrete mass. Mild induration within the adjacent right parapharyngeal fat. No retropharyngeal collection or swelling. Lingual tonsils are hypertrophied as well and fill the  vallecula. No convincing evidence for associated epiglottitis. Remainder of the hypopharynx and supraglottic larynx within normal limits. Glottis normal. Subglottic airway patent clear. Salivary glands: Salivary glands including the parotid and submandibular glands are within normal limits. Thyroid: Normal. Lymph nodes: Mildly prominent bilateral level II lymph nodes measure up to 1.3 cm, likely reactive. No other enlarged or pathologic adenopathy within the neck. Vascular: Normal intravascular enhancement seen throughout the neck. Limited intracranial: Unremarkable. Visualized orbits: Unremarkable. Mastoids and visualized paranasal sinuses: Paranasal sinuses are largely clear. Mastoid air cells and middle ear cavities are well pneumatized and free of fluid. Skeleton: No discrete or worrisome osseous lesions. No significant spondylosis for age. Upper chest: Visualized upper chest demonstrates no acute finding. Partially visualized lung apices are clear. Other: None. IMPRESSION: 1. Findings consistent with acute tonsillitis/pharyngitis with superimposed 1.7 x 0.7 x 1.3 cm right tonsillar/peritonsillar abscess as above. 2. Mildly prominent bilateral level II lymph nodes, likely reactive. Electronically Signed   By: Jeannine Boga M.D.   On: 11/26/2020 19:37   CT US GUIDE VASC ACCESS LT NO REPORT  Result Date: 11/26/2020 There is no Radiologist interpretation  for this exam.   Procedures Procedures   Medications Ordered in ED Medications  dexamethasone (DECADRON) injection 10 mg (10 mg Intravenous Given 11/26/20 1500)  clindamycin (CLEOCIN) IVPB 600 mg (0 mg Intravenous Stopped 11/26/20 1927)  iohexol (OMNIPAQUE) 300 MG/ML solution 75 mL (75 mLs Intravenous Contrast Given 11/26/20 1759)  morphine 4 MG/ML injection 4 mg (4 mg Intravenous Given 11/26/20 1857)  ondansetron (ZOFRAN) injection 4 mg (4 mg Intravenous Given 11/26/20 1856)  sodium chloride 0.9 % bolus 1,000 mL (1,000 mLs Intravenous New  Bag/Given 11/26/20 2100)    ED  Course  I have reviewed the triage vital signs and the nursing notes.  Pertinent labs & imaging results that were available during my care of the patient were reviewed by me and considered in my medical decision making (see chart for details).    MDM Rules/Calculators/A&P                           Patient presents with sore throat and concern for peritonsillar abscess.  Labs with leukocytosis.  Febrile on arrival.  CT scan shows small abscess, not large enough for drainage.  Given IV clindamycin and Decadron.  Given her inability to tolerate secretions and decreased p.o. intake for the last several days she will need admission for IV antibiotics, pain control and hydration.  Additional labs and strep test pending.  8:51 PM Discussed with Dr. Hal Hope.  He will admit but would like ENT to consult.  9:12 PM Discussed with ENT, Dr. Marcelline Deist.  They will be happy to consult and evaluate if patient worsens in any way.   Final Clinical Impression(s) / ED Diagnoses Final diagnoses:  Pharyngitis, unspecified etiology  Peritonsillar abscess    Rx / DC Orders ED Discharge Orders     None        Loni Muse Gwenlyn Perking 11/26/20 2113    Wyvonnia Dusky, MD 11/27/20 [phone]

## 2020-11-26 NOTE — ED Triage Notes (Signed)
Pt here d/t sore throat and unable to swallow. Pt seen at urgent care and told to come here d/t abscess. Pt constantly spitting in triage and visible uncomfortable.

## 2020-11-26 NOTE — Discharge Instructions (Addendum)
-  Sent to ED via personal vehicle. She understands she has a life threatening condition and states she will head straight to the ED.

## 2020-11-26 NOTE — ED Notes (Signed)
Called several time by several staff for triage w/o response.

## 2020-11-26 NOTE — ED Provider Notes (Deleted)
Emergency Medicine Provider Triage Evaluation Note  Linda Dalton , a 45 y.o. female  was evaluated in triage.  Pt complains of sore throat and difficulty breathing since Thursday.  History of asthma.  Mother at bedside who states that patient is not often sick or complaining, but has been feeling as though her throat is closing and is having more difficulty breathing.  Also complaining that her throat is sore.  No recent change in diet or medications.  Review of Systems  Positive: Sore throat, dysphagia, shortness of breath, nonproductive cough Negative: Chest pain, abdominal pain, nausea, vomiting, fever  Physical Exam  LMP 03/15/2013  Gen:   Awake, no distress   Resp:  Normal effort  MSK:   Moves extremities without difficulty  Other:  Tolerating own secretions  Medical Decision Making  Medically screening exam initiated at 1:01 PM.  Appropriate orders placed.  Arion Summers-Chapman was informed that the remainder of the evaluation will be completed by another provider, this initial triage assessment does not replace that evaluation, and the importance of remaining in the ED until their evaluation is complete.  \   Karl Erway T, PA-C 11/26/20 1302

## 2020-11-26 NOTE — ED Notes (Signed)
This patient has received 75 ml's of IV omni300 contrast extravasation into the right antecubital during a CT Soft Tissue Neck with IV contrast exam.  The exam was performed on (date) 11/26/20  Site / affected area assessed by Dr Melanee Spry

## 2020-11-26 NOTE — ED Notes (Addendum)
Patient is being discharged from the Urgent Care and sent to the Emergency Department via POV. Per Marin Roberts PA, patient is in need of higher level of care due to possible peritonsillar abscess, difficulty managing secretions, muffled voice sounds. Patient is aware and verbalizes understanding of plan of care. Patient stated she is unable to take tylenol due to inability to swallow. Allergic to all NSAIDS Vitals:   11/26/20 1050  BP: (!) 160/78  Pulse: 95  Resp: 16  Temp: (!) 101.1 F (38.4 C)  SpO2: 97%

## 2020-11-27 ENCOUNTER — Other Ambulatory Visit: Payer: Self-pay

## 2020-11-27 ENCOUNTER — Inpatient Hospital Stay (HOSPITAL_COMMUNITY): Payer: BLUE CROSS/BLUE SHIELD

## 2020-11-27 DIAGNOSIS — M79605 Pain in left leg: Secondary | ICD-10-CM

## 2020-11-27 DIAGNOSIS — J36 Peritonsillar abscess: Secondary | ICD-10-CM | POA: Diagnosis present

## 2020-11-27 DIAGNOSIS — Z20822 Contact with and (suspected) exposure to covid-19: Secondary | ICD-10-CM | POA: Diagnosis present

## 2020-11-27 DIAGNOSIS — Z86718 Personal history of other venous thrombosis and embolism: Secondary | ICD-10-CM | POA: Diagnosis not present

## 2020-11-27 DIAGNOSIS — Z95828 Presence of other vascular implants and grafts: Secondary | ICD-10-CM | POA: Diagnosis not present

## 2020-11-27 DIAGNOSIS — Z880 Allergy status to penicillin: Secondary | ICD-10-CM | POA: Diagnosis not present

## 2020-11-27 DIAGNOSIS — J029 Acute pharyngitis, unspecified: Secondary | ICD-10-CM | POA: Diagnosis present

## 2020-11-27 DIAGNOSIS — J45909 Unspecified asthma, uncomplicated: Secondary | ICD-10-CM | POA: Diagnosis present

## 2020-11-27 DIAGNOSIS — Z79899 Other long term (current) drug therapy: Secondary | ICD-10-CM | POA: Diagnosis not present

## 2020-11-27 DIAGNOSIS — R600 Localized edema: Secondary | ICD-10-CM

## 2020-11-27 DIAGNOSIS — R112 Nausea with vomiting, unspecified: Secondary | ICD-10-CM

## 2020-11-27 DIAGNOSIS — I82401 Acute embolism and thrombosis of unspecified deep veins of right lower extremity: Secondary | ICD-10-CM | POA: Diagnosis not present

## 2020-11-27 DIAGNOSIS — Z87891 Personal history of nicotine dependence: Secondary | ICD-10-CM | POA: Diagnosis not present

## 2020-11-27 DIAGNOSIS — Z90710 Acquired absence of both cervix and uterus: Secondary | ICD-10-CM | POA: Diagnosis not present

## 2020-11-27 DIAGNOSIS — R1310 Dysphagia, unspecified: Secondary | ICD-10-CM

## 2020-11-27 DIAGNOSIS — Z881 Allergy status to other antibiotic agents status: Secondary | ICD-10-CM | POA: Diagnosis not present

## 2020-11-27 DIAGNOSIS — Z885 Allergy status to narcotic agent status: Secondary | ICD-10-CM | POA: Diagnosis not present

## 2020-11-27 DIAGNOSIS — Z886 Allergy status to analgesic agent status: Secondary | ICD-10-CM | POA: Diagnosis not present

## 2020-11-27 DIAGNOSIS — Z9851 Tubal ligation status: Secondary | ICD-10-CM | POA: Diagnosis not present

## 2020-11-27 DIAGNOSIS — Z7951 Long term (current) use of inhaled steroids: Secondary | ICD-10-CM | POA: Diagnosis not present

## 2020-11-27 DIAGNOSIS — Z825 Family history of asthma and other chronic lower respiratory diseases: Secondary | ICD-10-CM | POA: Diagnosis not present

## 2020-11-27 LAB — CBC
HCT: 39.9 % (ref 36.0–46.0)
HCT: 39.8 % (ref 36.0–46.0)
Hemoglobin: 13 g/dL (ref 12.0–15.0)
Hemoglobin: 13.2 g/dL (ref 12.0–15.0)
MCH: 30.4 pg (ref 26.0–34.0)
MCH: 30.5 pg (ref 26.0–34.0)
MCHC: 32.6 g/dL (ref 30.0–36.0)
MCHC: 33.2 g/dL (ref 30.0–36.0)
MCV: 93.4 fL (ref 80.0–100.0)
MCV: 91.9 fL (ref 80.0–100.0)
Platelets: 251 10*3/uL (ref 150–400)
Platelets: 272 10*3/uL (ref 150–400)
RBC: 4.27 MIL/uL (ref 3.87–5.11)
RBC: 4.33 MIL/uL (ref 3.87–5.11)
RDW: 14 % (ref 11.5–15.5)
RDW: 13.9 % (ref 11.5–15.5)
WBC: 13.8 10*3/uL — ABNORMAL HIGH (ref 4.0–10.5)
WBC: 13.2 10*3/uL — ABNORMAL HIGH (ref 4.0–10.5)
nRBC: 0 % (ref 0.0–0.2)
nRBC: 0 % (ref 0.0–0.2)

## 2020-11-27 LAB — COMPREHENSIVE METABOLIC PANEL
ALT: 19 U/L (ref 0–44)
AST: 19 U/L (ref 15–41)
Albumin: 3.2 g/dL — ABNORMAL LOW (ref 3.5–5.0)
Alkaline Phosphatase: 117 U/L (ref 38–126)
Anion gap: 10 (ref 5–15)
BUN: 12 mg/dL (ref 6–20)
CO2: 23 mmol/L (ref 22–32)
Calcium: 8.5 mg/dL — ABNORMAL LOW (ref 8.9–10.3)
Chloride: 101 mmol/L (ref 98–111)
Creatinine, Ser: 0.68 mg/dL (ref 0.44–1.00)
GFR, Estimated: >60 mL/min (ref >60)
Glucose, Bld: 130 mg/dL — ABNORMAL HIGH (ref 70–99)
Potassium: 3.6 mmol/L (ref 3.5–5.1)
Sodium: 134 mmol/L — ABNORMAL LOW (ref 135–145)
Total Bilirubin: 0.6 mg/dL (ref 0.3–1.2)
Total Protein: 7.6 g/dL (ref 6.5–8.1)

## 2020-11-27 LAB — HIV ANTIBODY (ROUTINE TESTING W REFLEX): HIV Screen 4th Generation wRfx: NONREACTIVE

## 2020-11-27 LAB — RESP PANEL BY RT-PCR (FLU A&B, COVID) ARPGX2
Influenza A by PCR: NEGATIVE
Influenza B by PCR: NEGATIVE
SARS Coronavirus 2 by RT PCR: NEGATIVE

## 2020-11-27 LAB — CREATININE, SERUM
Creatinine, Ser: 0.74 mg/dL (ref 0.44–1.00)
GFR, Estimated: >60 mL/min (ref >60)

## 2020-11-27 LAB — MONONUCLEOSIS SCREEN: Mono Screen: NEGATIVE

## 2020-11-27 MED ORDER — MONTELUKAST SODIUM 10 MG PO TABS
10.0000 mg | ORAL_TABLET | Freq: Every day | ORAL | Status: DC
  Administered 2020-11-27 – 2020-11-28 (×2): 10 mg via ORAL
  Filled 2020-11-27 (×2): qty 1

## 2020-11-27 MED ORDER — ONDANSETRON HCL 4 MG/2ML IJ SOLN
4.0000 mg | Freq: Once | INTRAMUSCULAR | Status: AC
  Administered 2020-11-27: 4 mg via INTRAVENOUS
  Filled 2020-11-27: qty 2
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
## 2020-11-27 NOTE — ED Notes (Signed)
Provider at bedside

## 2020-11-27 NOTE — ED Notes (Signed)
Pt's bracelets placed in front pocket of backpack per request.

## 2020-11-27 NOTE — Hospital Course (Signed)
45 year old woman PMH of peritonsillar abscesses in the past by report, last more than 20 years ago, extensive DVT left lower extremity previously on Xarelto, presented with increasing throat pain and difficulty swallowing. --11/14 admitted for right-sided peritonsillar abscess, pharyngitis.  ENT recommended conservative management.

## 2020-11-27 NOTE — Assessment & Plan Note (Signed)
--   Right-sided, EDP discussed with ENT recommended no surgical drainage, antibiotics, supportive care, consult ENT if condition worsens. -- Clinically better today, able to tolerate some liquids, respiratory status stable, swallowing less painful

## 2020-11-27 NOTE — Assessment & Plan Note (Signed)
--   Appears stable at this point.  Continue bronchodilators.

## 2020-11-27 NOTE — ED Notes (Signed)
Received verbal report from [place] at this time

## 2020-11-27 NOTE — Assessment & Plan Note (Signed)
--   History of extensive DVT in this leg in the past.  Just returned within the last 48 hours from a 10-hour drive from [place].  Length developed swelling after that. -- Check left lower extremity venous Doppler, rule out DVT

## 2020-11-27 NOTE — Assessment & Plan Note (Signed)
--   Extensive left lower extremity DVT in the past.  Has been off Xarelto for 6 or more months.

## 2020-11-27 NOTE — Assessment & Plan Note (Signed)
resolved 

## 2020-11-27 NOTE — ED Notes (Signed)
Pt provided cleanser, wash clothes, and new linens.

## 2020-11-28 DIAGNOSIS — I82401 Acute embolism and thrombosis of unspecified deep veins of right lower extremity: Secondary | ICD-10-CM | POA: Diagnosis not present

## 2020-11-28 DIAGNOSIS — J029 Acute pharyngitis, unspecified: Secondary | ICD-10-CM

## 2020-11-28 DIAGNOSIS — J36 Peritonsillar abscess: Secondary | ICD-10-CM | POA: Diagnosis not present

## 2020-11-28 MED ORDER — CLINDAMYCIN HCL 300 MG PO CAPS: 600.0000 mg | ORAL_CAPSULE | Freq: Four times a day (QID) | ORAL | 0 refills | Status: AC

## 2020-11-28 MED ORDER — PHENOL 1.4 % MT LIQD
1.0000 | OROMUCOSAL | Status: DC | PRN
  Administered 2020-11-28: 1 via OROMUCOSAL
  Filled 2020-11-28: qty 177

## 2020-11-28 MED ORDER — ONDANSETRON 4 MG PO TBDP: 4.0000 mg | ORAL_TABLET | Freq: Three times a day (TID) | ORAL | 0 refills | Status: DC | PRN
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
## 2020-11-28 NOTE — Discharge Summary (Addendum)
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 --11/14 admitted for right-sided peritonsillar abscess, pharyngitis.  ENT recommended conservative management.  Discharge Diagnoses:  Principal Problem:   Peritonsillar abscess Active Problems:   Asthma   DVT (deep venous thrombosis) (HCC)   Pharyngitis   Odynophagia   Nausea & vomiting   Leg edema, left  Peritonsillar abscess: EDP discussed with the ENT after CT scan of the pharynx was done that showed pharyngitis tonsillitis and a 1.7 x 1.3 cm tonsillar abscess ENT recommended no surgical intervention due to steroids and IV antibiotics by the next day she was able to swallow, the day of discharge she was able to tolerate her full liquid diet and she had to go home as her son was alone at home she requested she be discharged. Risk and benefit of being discharged today were explained to her and she seems to understand, she was explaining back to me what will happen if she does not take her antibiotics or if she gets worse.  She relates she has to go home and take care of her son and she will be back if she gets worse.  This was explained in a very detailed way to her and she expressed understanding. She remained afebrile throughout her hospital stay her leukocytosis improved she was  changed to clindamycin orally which she will continue for an additional 13 days.  Left lower extremity edema: Lower extremity Doppler was negative for DVT she was satting 100% on room air.  History of DVT: In the past has been off Xarelto since 6 months lower extremity Doppler was negative for DVT.  Asthma: Stable continue bronchodilators no medications have been changed.  Nausea and vomiting: Now resolved tolerated her diet this morning she was given Zofran over-the-counter.  Discharge Instructions  Discharge Instructions     Diet - low sodium heart healthy   Complete by: As directed    Increase activity slowly   Complete by: As directed       Allergies as of 11/28/2020       Reactions   Aspirin Shortness Of Breath   Clarithromycin Anaphylaxis, Hives, Shortness Of Breath, Swelling, Other (See Comments)   Mouth swells   Codeine Hives, Rash, Swelling   Ibuprofen Anaphylaxis, Shortness Of Breath   Naproxen Anaphylaxis, Nausea And Vomiting   Nsaids Anaphylaxis   Penicillins Swelling, Rash   Has patient had a PCN reaction causing immediate rash, facial/tongue/throat swelling, SOB or lightheadedness with hypotension: Yes Has patient had a PCN reaction causing severe rash involving mucus membranes or skin necrosis: Unk Has patient had a PCN reaction that required hospitalization: Yes Has patient had a PCN reaction occurring within the last 10 years: Yes If all of the above answers are "NO", then may proceed with Cephalosporin use.        Medication List     TAKE these medications    acetaminophen 500 MG tablet Commonly known as: TYLENOL Take 500-1,000 mg  by mouth every 6 (six) hours as needed (for pain or headaches).   albuterol (2.5 MG/3ML) 0.083% nebulizer solution Commonly known as: PROVENTIL Take 3 mLs (2.5 mg total) by nebulization every 6 (six) hours as needed for wheezing or shortness of breath.   ProAir HFA 108 (90 Base) MCG/ACT inhaler Generic drug:  albuterol Inhale 2 puffs into the lungs every 4 (four) hours as needed for Wheezing or Shortness of Breath.   ProAir HFA 108 (90 Base) MCG/ACT inhaler Generic drug: albuterol Inhale 2 puffs into the lungs every 4 (four) hours as needed for Wheezing or Shortness of Breath.   budesonide-formoterol 160-4.5 MCG/ACT inhaler Commonly known as: SYMBICORT Inhale 2 puffs into the lungs 2 (two) times daily. What changed: Another medication with the same name was removed. Continue taking this medication, and follow the directions you see here.   cholecalciferol 25 MCG (1000 UNIT) tablet Commonly known as: VITAMIN D Take 1,000 Units by mouth daily.   clindamycin 300 MG capsule Commonly known as: CLEOCIN Take 2 capsules (600 mg total) by mouth 4 (four) times daily for 13 days.   dupilumab 300 MG/2ML prefilled syringe Commonly known as: DUPIXENT Inject 300 mg into the skin every 14 (fourteen) days.   EasiVent inhaler Use as directed with all inhalers.   AeroChamber Plus Flo-Vu Medium Misc use as direected   fexofenadine 180 MG tablet Commonly known as: ALLEGRA Take 180 mg by mouth daily as needed for allergies.   fluticasone 50 MCG/ACT nasal spray Commonly known as: FLONASE use 2 sprays in each nostril daily as directed What changed:  how much to take when to take this reasons to take this   ipratropium-albuterol 0.5-2.5 (3) MG/3ML Soln Commonly known as: DUONEB Take 3 mLs by nebulization every 4 (four) hours as needed for up to 2 doses.   metroNIDAZOLE 500 MG tablet Commonly known as: FLAGYL Take 1 tab by mouth twice daily for bacterial vaginitis   montelukast 10 MG tablet Commonly known as: SINGULAIR Take 1 tablet (10 mg total) by mouth daily.   montelukast 10 MG tablet Commonly known as: SINGULAIR Take 1 tablet (10 mg total) by mouth nightly.   montelukast 10 MG tablet Commonly known as: SINGULAIR Take 1 tablet (10 mg total) by mouth nightly.   Olopatadine HCl 0.2 %  Soln 1 drop in each eye once daily as needed.   ondansetron 4 MG disintegrating tablet Commonly known as: Zofran ODT Take 1 tablet (4 mg total) by mouth every 8 (eight) hours as needed for nausea or vomiting.   ONE-A-DAY WOMENS PO Take 1 tablet by mouth daily.   predniSONE 50 MG tablet Commonly known as: DELTASONE Take 1 tablet (50 mg total) by mouth daily with breakfast.   rivaroxaban 20 MG Tabs tablet Commonly known as: XARELTO Take 1 tablet (20 mg total) by mouth daily with supper for 30 days.   vitamin B-12 1000 MCG tablet Commonly known as: CYANOCOBALAMIN Take 1,000 mcg by mouth daily.        Allergies  Allergen Reactions   Aspirin Shortness Of Breath   Clarithromycin Anaphylaxis, Hives, Shortness Of Breath, Swelling and Other (See Comments)    Mouth swells   Codeine Hives, Rash and Swelling   Ibuprofen Anaphylaxis and Shortness Of Breath   Naproxen Anaphylaxis and Nausea And Vomiting   Nsaids Anaphylaxis   Penicillins Swelling and Rash    Has patient had a PCN reaction causing immediate rash, facial/tongue/throat swelling, SOB or lightheadedness with hypotension: Yes Has  patient had a PCN reaction causing severe rash involving mucus membranes or skin necrosis: Unk Has patient had a PCN reaction that required hospitalization: Yes Has patient had a PCN reaction occurring within the last 10 years: Yes If all of the above answers are "NO", then may proceed with Cephalosporin use.     Consultations: None   Procedures/Studies: DG Chest 2 View  Result Date: 11/26/2020 CLINICAL DATA:  Shortness of breath, weakness, sore throat EXAM: CHEST - 2 VIEW COMPARISON:  Chest radiograph 04/17/2019 FINDINGS: The cardiomediastinal silhouette is normal. The lungs are clear, without focal consolidation or pulmonary edema. There is no pleural effusion or pneumothorax. There is no acute osseous abnormality. IMPRESSION: No radiographic evidence of acute cardiopulmonary process.  Electronically Signed   By: Valetta Mole M.D.   On: 11/26/2020 13:26   CT Soft Tissue Neck W Contrast  Result Date: 11/26/2020 CLINICAL DATA:  Initial evaluation for acute sore throat, unable to swallow. EXAM: CT NECK WITH CONTRAST TECHNIQUE: Multidetector CT imaging of the neck was performed using the standard protocol following the bolus administration of intravenous contrast. CONTRAST:  75mL OMNIPAQUE IOHEXOL 300 MG/ML  SOLN COMPARISON:  Prior CT from 06/24/2013. FINDINGS: Pharynx and larynx: Oral cavity within normal limits. Palatine tonsils are hypertrophied and enhancing bilaterally, consistent with acute tonsillitis. Superimposed hypodense collection along the lateral margin of the right tonsil measures 1.7 x 0.7 x 1.3 cm, consistent with a tonsillar/peritonsillar abscess (series 11, image 46). Associated mild mucosal edema within the adjacent oropharynx. Slight asymmetric fullness of the right nasopharynx without discrete mass. Mild induration within the adjacent right parapharyngeal fat. No retropharyngeal collection or swelling. Lingual tonsils are hypertrophied as well and fill the vallecula. No convincing evidence for associated epiglottitis. Remainder of the hypopharynx and supraglottic larynx within normal limits. Glottis normal. Subglottic airway patent clear. Salivary glands: Salivary glands including the parotid and submandibular glands are within normal limits. Thyroid: Normal. Lymph nodes: Mildly prominent bilateral level II lymph nodes measure up to 1.3 cm, likely reactive. No other enlarged or pathologic adenopathy within the neck. Vascular: Normal intravascular enhancement seen throughout the neck. Limited intracranial: Unremarkable. Visualized orbits: Unremarkable. Mastoids and visualized paranasal sinuses: Paranasal sinuses are largely clear. Mastoid air cells and middle ear cavities are well pneumatized and free of fluid. Skeleton: No discrete or worrisome osseous lesions. No  significant spondylosis for age. Upper chest: Visualized upper chest demonstrates no acute finding. Partially visualized lung apices are clear. Other: None. IMPRESSION: 1. Findings consistent with acute tonsillitis/pharyngitis with superimposed 1.7 x 0.7 x 1.3 cm right tonsillar/peritonsillar abscess as above. 2. Mildly prominent bilateral level II lymph nodes, likely reactive. Electronically Signed   By: Jeannine Boga M.D.   On: 11/26/2020 19:37   VAS [place] LOWER EXTREMITY VENOUS (DVT)  Result Date: 11/27/2020  Lower Venous DVT Study Patient Name:  Linda Dalton  Date of Exam:   11/27/2020 Medical Rec #: [id number]                 Accession #:    [id number] Date of Birth: [birth year]                Patient Gender: F Patient Age:   56 years Exam Location:  [facility] Procedure:      VAS [place] LOWER EXTREMITY VENOUS (DVT) Referring Phys: Murray Hodgkins --------------------------------------------------------------------------------  Indications: Pain.  Risk Factors: Recent extended travel HX of LLE DVT. 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 Bilateral testing is considered an integral part of a complete examination. Limited examinations for reoccurring indications may be performed as noted. The reflux portion of the exam is performed with the patient in reverse Trendelenburg.  +-----+---------------+---------+-----------+----------+--------------+ RIGHTCompressibilityPhasicitySpontaneityPropertiesThrombus Aging +-----+---------------+---------+-----------+----------+--------------+ CFV  Full           Yes      Yes                                  +-----+---------------+---------+-----------+----------+--------------+   +---------+---------------+---------+-----------+----------+--------------+ LEFT     CompressibilityPhasicitySpontaneityPropertiesThrombus Aging +---------+---------------+---------+-----------+----------+--------------+ CFV      Full           Yes      Yes                                 +---------+---------------+---------+-----------+----------+--------------+ SFJ      Full                                                        +---------+---------------+---------+-----------+----------+--------------+ FV Prox  Full           Yes      Yes                                 +---------+---------------+---------+-----------+----------+--------------+ FV Mid   Full           Yes      Yes                                 +---------+---------------+---------+-----------+----------+--------------+ FV DistalFull           Yes      Yes                                 +---------+---------------+---------+-----------+----------+--------------+ PFV      Full                                                        +---------+---------------+---------+-----------+----------+--------------+ POP      Full           Yes      Yes                                 +---------+---------------+---------+-----------+----------+--------------+ PTV      Full                                                        +---------+---------------+---------+-----------+----------+--------------+  PERO     Full                                                        +---------+---------------+---------+-----------+----------+--------------+     Summary: RIGHT: - No evidence of common femoral vein obstruction.  LEFT: - There is no evidence of deep vein thrombosis in the lower extremity. - There is no evidence of superficial venous thrombosis.  - No cystic structure found in the popliteal fossa.  *See table(s) above  for measurements and observations. Electronically signed by Deitra Mayo MD on 11/27/2020 at 7:59:27 PM.    Final    CT US GUIDE VASC ACCESS LT NO REPORT  Result Date: 11/26/2020 There is no Radiologist interpretation  for this exam.  (Echo, Carotid, EGD, Colonoscopy, ERCP)    Subjective: Feels much better this morning tolerating her diet, she relates she has to go home as her son is alone at home and they have never been put in this situation, she relates she feels significantly much better than yesterday.  Discharge Exam: Vitals:   11/28/20 0505 11/28/20 0733  BP: 123/89 105/71  Pulse: 80 80  Resp: 16 16  Temp: 98.3 F (36.8 C) 98.2 F (36.8 C)  SpO2: 99% 100%   Vitals:   11/27/20 2057 11/28/20 0042 11/28/20 0505 11/28/20 0733  BP: 129/84 116/78 123/89 105/71  Pulse: 71 71 80 80  Resp: 17 17 16 16   Temp: 98.5 F (36.9 C) 98.2 F (36.8 C) 98.3 F (36.8 C) 98.2 F (36.8 C)  TempSrc: Oral Oral  Oral  SpO2: 100% 97% 99% 100%  Weight:      Height:        General: Pt is alert, awake, not in acute distress Cardiovascular: RRR, S1/S2 +, no rubs, no gallops Respiratory: CTA bilaterally, no wheezing, no rhonchi Abdominal: Soft, NT, ND, bowel sounds + Extremities: no edema, no cyanosis    The results of significant diagnostics from this hospitalization (including imaging, microbiology, ancillary and laboratory) are listed below for reference.     Microbiology: Recent Results (from the past 240 hour(s))  Group A Strep by PCR     Status: None   Collection Time: 11/26/20  6:02 PM   Specimen: Throat; Sterile Swab  Result Value Ref Range Status   Group A Strep by PCR NOT DETECTED NOT DETECTED Final    Comment: Performed at [facility], [place] [address] [place], [place] [address]  Resp Panel by RT-PCR (Flu A&B, Covid) Nasopharyngeal Swab     Status: None   Collection Time: 11/27/20  1:25 AM   Specimen: Nasopharyngeal Swab; Nasopharyngeal(NP) swabs in vial  transport medium  Result Value Ref Range Status   SARS Coronavirus 2 by RT PCR NEGATIVE NEGATIVE Final    Comment: (NOTE) SARS-CoV-2 target nucleic acids are NOT DETECTED.  The SARS-CoV-2 RNA is generally detectable in upper respiratory specimens during the acute phase of infection. The lowest concentration of SARS-CoV-2 viral copies this assay can detect is 138 copies/mL. A negative result does not preclude SARS-Cov-2 infection and should not be used as the sole basis for treatment or other patient management decisions. A negative result may occur with  improper specimen collection/handling, submission of specimen other than nasopharyngeal swab, presence of viral mutation(s) within the areas targeted by this assay, and  inadequate number of viral copies(<138 copies/mL). A negative result must be combined with clinical observations, patient history, and epidemiological information. The expected result is Negative.  Fact Sheet for Patients:  EntrepreneurPulse.com.au  Fact Sheet for Healthcare Providers:  IncredibleEmployment.be  This test is no t yet approved or cleared by the [place] FDA and  has been authorized for detection and/or diagnosis of SARS-CoV-2 by FDA under an Emergency Use Authorization (EUA). This EUA will remain  in effect (meaning this test can be used) for the duration of the COVID-19 declaration under Section 564(b)(1) of the Act, 21 U.S.C.section 360bbb-3(b)(1), unless the authorization is terminated  or revoked sooner.       Influenza A by PCR NEGATIVE NEGATIVE Final   Influenza B by PCR NEGATIVE NEGATIVE Final    Comment: (NOTE) The Xpert Xpress SARS-CoV-2/FLU/RSV plus assay is intended as an aid in the diagnosis of influenza from Nasopharyngeal swab specimens and should not be used as a sole basis for treatment. Nasal washings and aspirates are unacceptable for Xpert Xpress SARS-CoV-2/FLU/RSV testing.  Fact  Sheet for Patients: EntrepreneurPulse.com.au  Fact Sheet for Healthcare Providers: IncredibleEmployment.be  This test is not yet approved or cleared by the [place] FDA and has been authorized for detection and/or diagnosis of SARS-CoV-2 by FDA under an Emergency Use Authorization (EUA). This EUA will remain in effect (meaning this test can be used) for the duration of the COVID-19 declaration under Section 564(b)(1) of the Act, 21 U.S.C. section 360bbb-3(b)(1), unless the authorization is terminated or revoked.  Performed at [facility], [place] [address], [place], [place] [address]      Labs: BNP (last 3 results) No results for input(s): BNP in the last 8760 hours. Basic Metabolic Panel: Recent Labs  Lab 11/26/20 1342 11/27/20 0125 11/27/20 0329  NA 135  --  134*  K 3.4*  --  3.6  CL 101  --  101  CO2 21*  --  23  GLUCOSE 88  --  130*  BUN 10  --  12  CREATININE 0.68 0.74 0.68  CALCIUM 8.8*  --  8.5*   Liver Function Tests: Recent Labs  Lab 11/27/20 0329  AST 19  ALT 19  ALKPHOS 117  BILITOT 0.6  PROT 7.6  ALBUMIN 3.2*   No results for input(s): LIPASE, AMYLASE in the last 168 hours. No results for input(s): AMMONIA in the last 168 hours. CBC: Recent Labs  Lab 11/26/20 1306 11/26/20 1342 11/27/20 0125 11/27/20 0329  WBC 12.7* 14.3* 13.8* 13.2*  NEUTROABS 11.0* 11.8*  --   --   HGB 13.1 13.7 13.0 13.2  HCT 39.6 42.6 39.9 39.8  MCV 88.8 93.8 93.4 91.9  PLT 243 273 251 272   Cardiac Enzymes: No results for input(s): CKTOTAL, CKMB, CKMBINDEX, TROPONINI in the last 168 hours. BNP: Invalid input(s): POCBNP CBG: No results for input(s): GLUCAP in the last 168 hours. D-Dimer No results for input(s): DDIMER in the last 72 hours. Hgb A1c No results for input(s): HGBA1C in the last 72 hours. Lipid Profile No results for input(s): CHOL, HDL, LDLCALC, TRIG, CHOLHDL, LDLDIRECT in the last 72 hours. Thyroid  function studies No results for input(s): TSH, T4TOTAL, T3FREE, THYROIDAB in the last 72 hours.  Invalid input(s): FREET3 Anemia work up No results for input(s): VITAMINB12, FOLATE, FERRITIN, TIBC, IRON, RETICCTPCT in the last 72 hours. Urinalysis    Component Value Date/Time   COLORURINE YELLOW 07/13/2017 2301   APPEARANCEUR CLOUDY (A) 07/13/2017 2301  LABSPEC 1.030 07/13/2017 2301   PHURINE 5.0 07/13/2017 2301   GLUCOSEU NEGATIVE 07/13/2017 2301   HGBUR MODERATE (A) 07/13/2017 2301   BILIRUBINUR NEGATIVE 07/13/2017 2301   KETONESUR NEGATIVE 07/13/2017 2301   PROTEINUR NEGATIVE 07/13/2017 2301   UROBILINOGEN 0.2 11/02/2013 1030   NITRITE NEGATIVE 07/13/2017 2301   LEUKOCYTESUR NEGATIVE 07/13/2017 2301   Sepsis Labs Invalid input(s): PROCALCITONIN,  WBC,  LACTICIDVEN Microbiology Recent Results (from the past 240 hour(s))  Group A Strep by PCR     Status: None   Collection Time: 11/26/20  6:02 PM   Specimen: Throat; Sterile Swab  Result Value Ref Range Status   Group A Strep by PCR NOT DETECTED NOT DETECTED Final    Comment: Performed at [facility], [place] [address], [place], [place] [address]  Resp Panel by RT-PCR (Flu A&B, Covid) Nasopharyngeal Swab     Status: None   Collection Time: 11/27/20  1:25 AM   Specimen: Nasopharyngeal Swab; Nasopharyngeal(NP) swabs in vial transport medium  Result Value Ref Range Status   SARS Coronavirus 2 by RT PCR NEGATIVE NEGATIVE Final    Comment: (NOTE) SARS-CoV-2 target nucleic acids are NOT DETECTED.  The SARS-CoV-2 RNA is generally detectable in upper respiratory specimens during the acute phase of infection. The lowest concentration of SARS-CoV-2 viral copies this assay can detect is 138 copies/mL. A negative result does not preclude SARS-Cov-2 infection and should not be used as the sole basis for treatment or other patient management decisions. A negative result may occur with  improper specimen collection/handling,  submission of specimen other than nasopharyngeal swab, presence of viral mutation(s) within the areas targeted by this assay, and inadequate number of viral copies(<138 copies/mL). A negative result must be combined with clinical observations, patient history, and epidemiological information. The expected result is Negative.  Fact Sheet for Patients:  EntrepreneurPulse.com.au  Fact Sheet for Healthcare Providers:  IncredibleEmployment.be  This test is no t yet approved or cleared by the [place] FDA and  has been authorized for detection and/or diagnosis of SARS-CoV-2 by FDA under an Emergency Use Authorization (EUA). This EUA will remain  in effect (meaning this test can be used) for the duration of the COVID-19 declaration under Section 564(b)(1) of the Act, 21 U.S.C.section 360bbb-3(b)(1), unless the authorization is terminated  or revoked sooner.       Influenza A by PCR NEGATIVE NEGATIVE Final   Influenza B by PCR NEGATIVE NEGATIVE Final    Comment: (NOTE) The Xpert Xpress SARS-CoV-2/FLU/RSV plus assay is intended as an aid in the diagnosis of influenza from Nasopharyngeal swab specimens and should not be used as a sole basis for treatment. Nasal washings and aspirates are unacceptable for Xpert Xpress SARS-CoV-2/FLU/RSV testing.  Fact Sheet for Patients: EntrepreneurPulse.com.au  Fact Sheet for Healthcare Providers: IncredibleEmployment.be  This test is not yet approved or cleared by the [place] FDA and has been authorized for detection and/or diagnosis of SARS-CoV-2 by FDA under an Emergency Use Authorization (EUA). This EUA will remain in effect (meaning this test can be used) for the duration of the COVID-19 declaration under Section 564(b)(1) of the Act, 21 U.S.C. section 360bbb-3(b)(1), unless the authorization is terminated or revoked.  Performed at [facility], [place] [address] [place], [place] [address]    SIGNED:   Charlynne Cousins, MD  [facility] 11/28/2020, 11:45 AM Pager   If 7PM-7AM, please contact night-coverage [email or web address] Password [email or web address]

## 2020-11-28 NOTE — Progress Notes (Signed)
Linda Dalton is a 44 y.o. female patient admitted. Awake, alert - oriented  X 4 - no acute distress noted.  VSS. IV in place, occlusive dsg intact without redness.  Orientation to room, and floor completed.  Admission INP armband ID verified with patient/family, and in place.   SR up x 2, fall assessment complete, with patient and family able to verbalize understanding of risk associated with falls, and verbalized understanding to call nsg before up out of bed.  Call light within reach, patient able to voice, and demonstrate understanding. No evidence of skin break down noted on exam.   Will cont to eval and treat per MD orders.

## 2020-11-28 NOTE — Progress Notes (Signed)
Pt discharged to home via wheelchair with all belongings and prescriptions.

## 2020-11-28 NOTE — Progress Notes (Signed)
Pt c/o still having pain when swallowing. Used General Admission PRN standing order set to order pt chloraseptic spray.

## 2020-11-29 ENCOUNTER — Telehealth: Payer: Self-pay

## 2020-11-29 NOTE — Telephone Encounter (Signed)
Transition Care Management Unsuccessful Follow-up Telephone Call  Date of discharge and from where:  11/28/2020, [facility]   Attempts:  1st Attempt  Reason for unsuccessful TCM follow-up call:  Left voice message on # [phone]. Call back requested to this CM.  Juluis Mire, NP  @ [facility] is listed as patient's PCP but she has not seen the patient since 03/2018. Need to inquire if patient would like to schedule follow up with Ms Oletta Lamas, NP or if she has another PCP.

## 2020-11-30 ENCOUNTER — Telehealth: Payer: Self-pay

## 2020-11-30 NOTE — Telephone Encounter (Signed)
Transition Care Management Unsuccessful Follow-up Telephone Call   Date of discharge and from where:  11/28/2020, [facility]    Attempts:  2nd Attempt   Reason for unsuccessful TCM follow-up call:  Left voice message on # [phone]. Call back requested to this CM.   Juluis Mire, NP  @ [facility] is listed as patient's PCP but she has not seen the patient since 03/2018. Need to inquire if patient would like to schedule follow up with Ms Oletta Lamas, NP or if she has another PCP.

## 2020-12-03 ENCOUNTER — Telehealth: Payer: Self-pay

## 2020-12-03 NOTE — Telephone Encounter (Signed)
Transition Care Management Unsuccessful Follow-up Telephone Call  Date of discharge and from where:  11/28/2020, [facility]  Attempts:  3rd Attempt  Reason for unsuccessful TCM follow-up call:  Left voice message  on # [phone]. Call back requested to this CM.   Juluis Mire, NP  @ [facility] is listed as patient's PCP but she has not seen the patient since 03/2018. Need to inquire if patient would like to schedule follow up with Ms Oletta Lamas, NP or if she has another PCP.

## 2020-12-31 ENCOUNTER — Other Ambulatory Visit: Payer: Self-pay

## 2020-12-31 MED ORDER — BUDESONIDE-FORMOTEROL FUMARATE 160-4.5 MCG/ACT IN AERO
INHALATION_SPRAY | Freq: Two times a day (BID) | RESPIRATORY_TRACT | 3 refills | Status: AC
  Filled 2020-12-31: qty 10.2, 25d supply, fill #0
  Filled 2021-01-31: qty 10.2, 30d supply, fill #0

## 2020-12-31 MED ORDER — ALBUTEROL SULFATE HFA 108 (90 BASE) MCG/ACT IN AERS
2.0000 | INHALATION_SPRAY | RESPIRATORY_TRACT | 1 refills | Status: DC | PRN
  Filled 2020-12-31: qty 8.5, 16d supply, fill #0

## 2021-01-02 ENCOUNTER — Other Ambulatory Visit: Payer: Self-pay

## 2021-01-09 ENCOUNTER — Other Ambulatory Visit: Payer: Self-pay

## 2021-01-31 ENCOUNTER — Other Ambulatory Visit: Payer: Self-pay

## 2022-11-21 ENCOUNTER — Emergency Department (HOSPITAL_COMMUNITY)
Admission: EM | Admit: 2022-11-21 | Discharge: 2022-11-22 | Disposition: A | Discharge status: Home / Self Care | Payer: Medicaid Other | Attending: Emergency Medicine | Admitting: Emergency Medicine

## 2022-11-21 ENCOUNTER — Other Ambulatory Visit: Payer: Self-pay

## 2022-11-21 ENCOUNTER — Emergency Department (HOSPITAL_COMMUNITY): Payer: Medicaid Other

## 2022-11-21 ENCOUNTER — Encounter (HOSPITAL_COMMUNITY): Payer: Self-pay

## 2022-11-21 DIAGNOSIS — R42 Dizziness and giddiness: Secondary | ICD-10-CM | POA: Insufficient documentation

## 2022-11-21 DIAGNOSIS — R112 Nausea with vomiting, unspecified: Secondary | ICD-10-CM | POA: Diagnosis not present

## 2022-11-21 LAB — CBC WITH DIFFERENTIAL/PLATELET
Abs Immature Granulocytes: 0 10*3/uL (ref 0.00–0.07)
Basophils Absolute: 0 10*3/uL (ref 0.0–0.1)
Basophils Relative: 0 %
Eosinophils Absolute: 0.1 10*3/uL (ref 0.0–0.5)
Eosinophils Relative: 1 %
HCT: 40.4 % (ref 36.0–46.0)
Hemoglobin: 13.3 g/dL (ref 12.0–15.0)
Immature Granulocytes: 0 %
Lymphocytes Relative: 47 %
Lymphs Abs: 2.3 10*3/uL (ref 0.7–4.0)
MCH: 30 pg (ref 26.0–34.0)
MCHC: 32.9 g/dL (ref 30.0–36.0)
MCV: 91 fL (ref 80.0–100.0)
Monocytes Absolute: 0.4 10*3/uL (ref 0.1–1.0)
Monocytes Relative: 8 %
Neutro Abs: 2.2 10*3/uL (ref 1.7–7.7)
Neutrophils Relative %: 44 %
Platelets: 287 10*3/uL (ref 150–400)
RBC: 4.44 MIL/uL (ref 3.87–5.11)
RDW: 13.8 % (ref 11.5–15.5)
WBC: 4.9 10*3/uL (ref 4.0–10.5)
nRBC: 0 % (ref 0.0–0.2)

## 2022-11-21 LAB — COMPREHENSIVE METABOLIC PANEL
ALT: 20 U/L (ref 0–44)
AST: 23 U/L (ref 15–41)
Albumin: 3.7 g/dL (ref 3.5–5.0)
Alkaline Phosphatase: 96 U/L (ref 38–126)
Anion gap: 7 (ref 5–15)
BUN: 10 mg/dL (ref 6–20)
CO2: 26 mmol/L (ref 22–32)
Calcium: 9.1 mg/dL (ref 8.9–10.3)
Chloride: 105 mmol/L (ref 98–111)
Creatinine, Ser: 0.76 mg/dL (ref 0.44–1.00)
GFR, Estimated: >60 mL/min (ref >60)
Glucose, Bld: 94 mg/dL (ref 70–99)
Potassium: 3.7 mmol/L (ref 3.5–5.1)
Sodium: 138 mmol/L (ref 135–145)
Total Bilirubin: 1.3 mg/dL — ABNORMAL HIGH (ref <1.2)
Total Protein: 7.9 g/dL (ref 6.5–8.1)

## 2022-11-21 LAB — TROPONIN I (HIGH SENSITIVITY)
Troponin I (High Sensitivity): <2 ng/L (ref <18)
Troponin I (High Sensitivity): 3 ng/L (ref <18)

## 2022-11-21 LAB — HCG, QUANTITATIVE, PREGNANCY: hCG, Beta Chain, Quant, S: <1 m[IU]/mL (ref <5)

## 2022-11-21 MED ORDER — MECLIZINE HCL 25 MG PO TABS
25.0000 mg | ORAL_TABLET | Freq: Once | ORAL | Status: AC
  Administered 2022-11-21: 25 mg via ORAL
  Filled 2022-11-21: qty 1

## 2022-11-21 MED ORDER — LORAZEPAM 2 MG/ML IJ SOLN
2.0000 mg | Freq: Once | INTRAMUSCULAR | Status: AC | PRN
  Administered 2022-11-22: 2 mg via INTRAVENOUS
  Filled 2022-11-21: qty 1

## 2022-11-21 MED ORDER — IOHEXOL 350 MG/ML SOLN
75.0000 mL | Freq: Once | INTRAVENOUS | Status: AC | PRN
  Administered 2022-11-21: 75 mL via INTRAVENOUS

## 2022-11-21 NOTE — ED Provider Triage Note (Addendum)
Emergency Medicine Provider Triage Evaluation Note  Linda Dalton , a 47 y.o. female  was evaluated in triage.  Pt complains of constant dizzy sensation, but no double vision, that started yesterday. She reports a pain that also travels down the left side of her neck and light sensitivity. This started yesterday while in the bathtub. Denies any chest pain, shortness of breath, arm or leg weakness, facial droop, slurred speech, any new leg swelling or pain.  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  Appropriate orders placed.  Linda Dalton was informed that the remainder of the evaluation will be completed by another provider, this initial triage assessment does not replace that evaluation, and the importance of remaining in the ED until their evaluation is complete.     Arabella Merles, PA-C 11/21/22 1228    Arabella Merles, PA-C 11/21/22 1235

## 2022-11-21 NOTE — ED Provider Notes (Signed)
[place] EMERGENCY DEPARTMENT AT [facility] Provider Note   CSN: [id number] Arrival date & time: 11/21/22  1128     History  Chief Complaint  Patient presents with   Emesis    Linda Dalton is a 47 y.o. female with PMH hypercoagulable state s/p IVC filter who presents for acute onset vertigo.  Patient states that yesterday at 12:45 PM when she lay down into the bathtub she noted acute onset room spinning sensation as well as nausea.  She then developed vomiting.  Symptoms have not resolved and do not improve with position, laying down, or any identified measures.  She has not been able to walk due to severity of symptoms.  She denies any numbness or tingling, weakness, double vision, difficulty swallowing or speaking.  She has no history of stroke.  She had a telemedicine visit with her PCP today who told her to come to the ED for evaluation.  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  She is no longer on anticoagulation since 2020. No chest pain, SOB, or history of Afib.  The history is provided by the patient and medical records.       Home Medications Prior to Admission medications   Medication Sig Start Date End Date Taking? Authorizing Provider  acetaminophen (TYLENOL) 500 MG tablet Take 500-1,000 mg by mouth every 6 (six) hours as needed (for pain or headaches).   Yes [provider]  albuterol (PROVENTIL) (2.5 MG/3ML) 0.083% nebulizer solution Take 3 mLs (2.5 mg total) by nebulization every 6 (six) hours as needed for wheezing or shortness of breath. 01/15/18  Yes Loletta Specter, PA-C  albuterol (VENTOLIN HFA) 108 (90 Base) MCG/ACT inhaler Inhale 2 puffs into the lungs every 4 (four) hours as needed for Wheezing or Shortness of Breath. 08/14/20  Yes   budesonide-formoterol (SYMBICORT) 160-4.5 MCG/ACT inhaler Inhale 2 PUFFS into the lungs 2 (two) times daily.  12/31/20  Yes   Dupilumab 300 MG/2ML SOSY Inject 300 mg into the skin every 14 (fourteen) days. 03/01/18  Yes [provider]  fexofenadine (ALLEGRA) 180 MG tablet Take 180 mg by mouth daily as needed for allergies.   Yes [provider]  montelukast (SINGULAIR) 10 MG tablet Take 1 tablet (10 mg total) by mouth nightly. 08/14/20  Yes   Multiple Vitamins-Calcium (ONE-A-DAY WOMENS PO) Take 1 tablet by mouth daily.   Yes [provider]  cholecalciferol (VITAMIN D) 25 MCG (1000 UNIT) tablet Take 1,000 Units by mouth daily. Patient not taking: Reported on 11/21/2022    [provider]  Spacer/Aero-Holding Chambers (AEROCHAMBER PLUS FLO-VU MEDIUM) MISC use as direected 08/14/20     Spacer/Aero-Holding Chambers (EASIVENT) inhaler Use as directed with all inhalers. 06/05/20     vitamin B-12 (CYANOCOBALAMIN) 1000 MCG tablet Take 1,000 mcg by mouth daily. Patient not taking: Reported on 11/21/2022    [provider]      Allergies    Aspirin, Clarithromycin, Codeine, Ibuprofen, Naproxen, Nsaids, and Penicillins    Review of Systems   Review of Systems per HPI above  Physical Exam Updated Vital Signs BP 109/81 (BP Location: Left Arm)   Pulse 66   Temp 98.2 F (36.8 C) (Oral)   Resp 20   Ht 5\' 7"  (1.702 m)   Wt 93 kg   LMP 03/15/2012   SpO2 100%   BMI 32.11 kg/m  Physical Exam Constitutional:      Appearance: Normal appearance.     Comments: Lying in room with lights off and eyes closed. Keeps eyes closed during HPI and majority of exam  HENT:     Head: Normocephalic and atraumatic.     Nose: Nose normal.     Mouth/Throat:     Mouth: Mucous membranes are moist.  Eyes:     Extraocular Movements: Extraocular movements intact.     Pupils: Pupils are equal, round, and reactive to light.     Comments: Lateral nystagmus bidirectionally on lateral gaze. Difficulty tracking finger on testing of EOMs. No dysconjugate gaze.   Cardiovascular:     Rate and  Rhythm: Normal rate and regular rhythm.     Pulses: Normal pulses.     Heart sounds: Normal heart sounds.  Pulmonary:     Effort: Pulmonary effort is normal.     Breath sounds: Normal breath sounds.  Abdominal:     General: There is no distension.     Palpations: Abdomen is soft.     Tenderness: There is no abdominal tenderness. There is no guarding.  Musculoskeletal:     Cervical back: Neck supple.     Right lower leg: No edema.     Left lower leg: No edema.  Skin:    General: Skin is warm and dry.     Capillary Refill: Capillary refill takes less than 2 seconds.  Neurological:     Mental Status: She is alert.     Comments: Normal cranial nerve exam. Cerebellar testing is without dysmetria but is very slow to perform finger nose. Unable to assess gait safely due to severe vertigo. Nystagmus lateral beating bilaterally per eye exam. Normal strength and sensation.     ED Results / Procedures / Treatments   Labs (all labs ordered are listed, but only abnormal results are displayed) Labs Reviewed  COMPREHENSIVE METABOLIC PANEL - Abnormal; Notable for the following components:      Result Value   Total Bilirubin 1.3 (*)    All other components within normal limits  CBC WITH DIFFERENTIAL/PLATELET  HCG, QUANTITATIVE, PREGNANCY  TROPONIN I (HIGH SENSITIVITY)  TROPONIN I (HIGH SENSITIVITY)    EKG EKG Interpretation Date/Time:  Friday November 21 2022 11:58:00 EST Ventricular Rate:  71 PR Interval:  160 QRS Duration:  80 QT Interval:  388 QTC Calculation: 421 R Axis:   60  Text Interpretation: Sinus rhythm with marked sinus arrhythmia Otherwise normal ECG When compared with ECG of 13-Jul-2017 22:34, PREVIOUS ECG IS PRESENT Confirmed by Virgina Norfolk [phone]) on 11/21/2022 2:34:38 PM  Radiology CT ANGIO HEAD NECK W WO CM  Result Date: 11/21/2022 CLINICAL DATA:  Stroke suspected, emesis EXAM: CT ANGIOGRAPHY HEAD AND NECK WITH AND WITHOUT CONTRAST TECHNIQUE: Multidetector CT  imaging of the head and neck was performed using the standard protocol during bolus administration of intravenous contrast. Multiplanar CT image reconstructions and MIPs were obtained to evaluate the vascular anatomy. Carotid stenosis measurements (when applicable) are obtained utilizing NASCET criteria, using the distal internal carotid diameter as the denominator. RADIATION DOSE REDUCTION: This exam was performed according to the departmental dose-optimization program which includes automated exposure control, adjustment of the mA and/or kV according to patient size and/or use of iterative reconstruction technique. CONTRAST:  75mL OMNIPAQUE IOHEXOL 350 MG/ML SOLN COMPARISON:  None Available. FINDINGS: CT HEAD FINDINGS Brain: No evidence of acute infarct, hemorrhage, mass, mass effect, or midline shift. No hydrocephalus or extra-axial fluid collection. Vascular:  No hyperdense vessel. Skull: Negative for fracture or focal lesion. Sinuses/Orbits: No acute finding. Sequela of prior endoscopic sinus surgery. Other: The mastoid air cells are well aerated. CTA NECK FINDINGS Aortic arch: Two-vessel arch with a common origin of the brachiocephalic and left common carotid arteries. Imaged portion shows no evidence of aneurysm or dissection. No significant stenosis of the major arch vessel origins. Right carotid system: No evidence of stenosis, dissection, or occlusion. Left carotid system: No evidence of stenosis, dissection, or occlusion. Vertebral arteries: No evidence of stenosis, dissection, or occlusion. Skeleton: No acute osseous abnormality. Other neck: Negative. Upper chest: No focal pulmonary opacity or pleural effusion. Review of the MIP images confirms the above findings CTA HEAD FINDINGS Anterior circulation: Both internal carotid arteries are patent to the termini, without significant stenosis. A1 segments patent. Normal anterior communicating artery. Anterior cerebral arteries are patent to their distal  aspects without significant stenosis. No M1 stenosis or occlusion. MCA branches perfused to their distal aspects without significant stenosis. Posterior circulation: Vertebral arteries patent to the vertebrobasilar junction without significant stenosis. Posterior inferior cerebellar patent on the right. The left PICA is not definitively seen. Basilar patent to its distal aspect without significant stenosis. Superior cerebellar arteries patent proximally. Patent P1 segments. PCAs perfused to their distal aspects without significant stenosis. The bilateral posterior communicating arteries are quite diminutive patent. Venous sinuses: As permitted by contrast timing, patent. Anatomic variants: None significant. No evidence of aneurysm or vascular malformation. Review of the MIP images confirms the above findings IMPRESSION: 1. No acute intracranial process. 2. No intracranial large vessel occlusion or significant stenosis. 3. No hemodynamically significant stenosis in the neck. Electronically Signed   By: Wiliam Ke M.D.   On: 11/21/2022 18:10    Procedures Procedures    Medications Ordered in ED Medications  LORazepam (ATIVAN) injection 2 mg (has no administration in time range)  iohexol (OMNIPAQUE) 350 MG/ML injection 75 mL (75 mLs Intravenous Contrast Given 11/21/22 1529)  meclizine (ANTIVERT) tablet 25 mg (25 mg Oral Given 11/21/22 1627)    ED Course/ Medical Decision Making/ A&P Clinical Course as of 11/21/22 2222  Fri Nov 21, 2022  1503 EKG sinus rhythm rate 71, normal axis and intervals, no ST segment changes or pathologic TWI, no signs of arrhythymia or predisposing condition [GD]  1504 Labs reassuring - normal WBC, hemoglobin, electrolytes, renal and liver function, negative hCG [GD]    Clinical Course User Index [GD] Karmen Stabs, MD                                 Medical Decision Making Amount and/or Complexity of Data Reviewed Labs: ordered. Decision-making details documented in  ED Course. Radiology: ordered and independent interpretation performed. Decision-making details documented in ED Course. ECG/medicine tests: ordered and independent interpretation performed. Decision-making details documented in ED Course.  Risk Prescription drug management.   47 year old female with history of May Thurner status post IVC filter not on anticoagulation who presents to the ED for acute onset vertigo greater than 24 hours ago.  Differential considered includes stroke, intracranial hemorrhage, vertebral dissection, intracranial mass, or peripheral etiology such as vestibular neuritis given ongoing nature of symptoms (rather than episodic or positional as would expect with BPPV).  Vitals are stable she is afebrile.  She is outside the window for code stroke activation.  Her neurologic exam is notable for difficulty with tracking for EOMs as well as lateral gaze  nystagmus on both sides.  She does not have dysmetria though is slow to perform finger-nose testing.  Labs were obtained and are notable for no leukocytosis, normal hemoglobin, normal electrolytes renal and liver function, negative pregnancy test.  EKG was obtained and shows sinus rhythm rate of 71, normal axis and intervals, no ST segment changes or pathologic T wave inversions.  There are no signs of A-fib, other arrhythmia, or arrhythmia predisposing condition.  A CTA head and neck was obtained to investigate for LVO versus vertebral dissection and shows no acute pathology, no dissection, no LVO.   Given meclizine.  On reassessment she is still symptomatic. MRI brain w/o contrast ordered as on reassessment patient was still subjectively symptomatic and did not feel comfortable walking due to feeling off balance.  Patient was very anxious about MRI and that it may trigger an asthma attack and initially refused.  After further discussion she was given p.o. Ativan in an attempt to further treat her vertigo that did not improve with  antihistamines, and to help with anxiety for MRI. Pending MRI at signout. Care handed to Dr. Wilkie Aye. See her note for further detail and MDM.           Final Clinical Impression(s) / ED Diagnoses Final diagnoses:  Vertigo    Rx / DC Orders ED Discharge Orders     None         Karmen Stabs, MD 11/21/22 9528    Alvira Monday, MD 11/24/22 2219

## 2022-11-21 NOTE — ED Notes (Signed)
Patient transported to CT 

## 2022-11-21 NOTE — ED Notes (Signed)
Pt ambulated to the bathroom with assistance. She kept her eyes closed most of the walk because the dizziness increases when her eyes are open.

## 2022-11-21 NOTE — ED Triage Notes (Signed)
Pt states that she started having dizziness, vomiting, and pressure behind eyes yesterday. Pt states the dizziness is constant and does not change w/movement. Pt has photosensitivity. Pressure increases when pt opens eyes. Pt denies numbness and tingling. Pt states she has left arm weakness, pain, and tingling earlier today. Pt denies pain, has tingling and numbness in left arm. Pt states she feels disoriented. GCS=15.

## 2022-11-22 ENCOUNTER — Emergency Department (HOSPITAL_COMMUNITY): Payer: Medicaid Other

## 2022-11-22 MED ORDER — MECLIZINE HCL 25 MG PO TABS: 25.0000 mg | ORAL_TABLET | Freq: Three times a day (TID) | ORAL | 0 refills | Status: AC | PRN

## 2022-11-22 NOTE — ED Provider Notes (Signed)
Patient signed out pending MRI.  MRIs without evidence of acute stroke.  On recheck, patient states she feels better and is ready to go home.  She is ambulatory at discharge.  Recommend meclizine as needed and close follow-up with her primary physician.   Shon Baton, MD 11/22/22 [phone]

## 2022-11-22 NOTE — ED Notes (Signed)
Patient transported to MRI 

## 2022-11-22 NOTE — ED Notes (Signed)
Patient ambulatory and little unsteady.

## 2022-11-22 NOTE — ED Notes (Signed)
Called MRI 0010. MRI tech states that it would be "a couple of hours" before they can schedule the pt.

## 2022-11-22 NOTE — Discharge Instructions (Signed)
You were seen today for dizziness.  Your workup including MRI is negative.  Follow-up closely with your primary doctor.  Take meclizine at home as needed for your symptoms.

## 2022-11-25 ENCOUNTER — Telehealth: Payer: Self-pay | Admitting: *Deleted

## 2022-11-25 NOTE — Telephone Encounter (Signed)
Pt called regarding PCP not receiving her ED encounter information. RNCM changed PCP on file and sent information as requested.

## 2023-04-12 ENCOUNTER — Emergency Department (HOSPITAL_COMMUNITY)

## 2023-04-12 ENCOUNTER — Encounter (HOSPITAL_COMMUNITY): Payer: Self-pay | Admitting: Emergency Medicine

## 2023-04-12 ENCOUNTER — Emergency Department (HOSPITAL_COMMUNITY)
Admission: EM | Admit: 2023-04-12 | Discharge: 2023-04-13 | Disposition: A | Discharge status: Home / Self Care | Attending: Emergency Medicine | Admitting: Emergency Medicine

## 2023-04-12 DIAGNOSIS — J36 Peritonsillar abscess: Secondary | ICD-10-CM | POA: Insufficient documentation

## 2023-04-12 DIAGNOSIS — R221 Localized swelling, mass and lump, neck: Secondary | ICD-10-CM | POA: Diagnosis present

## 2023-04-12 LAB — COMPREHENSIVE METABOLIC PANEL WITH GFR
ALT: 20 U/L (ref 0–44)
AST: 24 U/L (ref 15–41)
Albumin: 3.5 g/dL (ref 3.5–5.0)
Alkaline Phosphatase: 103 U/L (ref 38–126)
Anion gap: 11 (ref 5–15)
BUN: 9 mg/dL (ref 6–20)
CO2: 24 mmol/L (ref 22–32)
Calcium: 9.2 mg/dL (ref 8.9–10.3)
Chloride: 104 mmol/L (ref 98–111)
Creatinine, Ser: 0.68 mg/dL (ref 0.44–1.00)
GFR, Estimated: >60 mL/min (ref >60)
Glucose, Bld: 92 mg/dL (ref 70–99)
Potassium: 3.8 mmol/L (ref 3.5–5.1)
Sodium: 139 mmol/L (ref 135–145)
Total Bilirubin: 0.8 mg/dL (ref 0.0–1.2)
Total Protein: 8.2 g/dL — ABNORMAL HIGH (ref 6.5–8.1)

## 2023-04-12 LAB — CBC
HCT: 39.8 % (ref 36.0–46.0)
Hemoglobin: 13.3 g/dL (ref 12.0–15.0)
MCH: 30.7 pg (ref 26.0–34.0)
MCHC: 33.4 g/dL (ref 30.0–36.0)
MCV: 91.9 fL (ref 80.0–100.0)
Platelets: 296 10*3/uL (ref 150–400)
RBC: 4.33 MIL/uL (ref 3.87–5.11)
RDW: 13.6 % (ref 11.5–15.5)
WBC: 9 10*3/uL (ref 4.0–10.5)
nRBC: 0 % (ref 0.0–0.2)

## 2023-04-12 MED ORDER — OXYCODONE HCL 5 MG PO TABS
5.0000 mg | ORAL_TABLET | Freq: Once | ORAL | Status: AC
  Administered 2023-04-12: 5 mg via ORAL
  Filled 2023-04-12: qty 1

## 2023-04-12 MED ORDER — DEXAMETHASONE SODIUM PHOSPHATE 10 MG/ML IJ SOLN
10.0000 mg | Freq: Once | INTRAMUSCULAR | Status: AC
  Administered 2023-04-12: 10 mg via INTRAVENOUS
  Filled 2023-04-12: qty 1

## 2023-04-12 MED ORDER — IOHEXOL 350 MG/ML SOLN
75.0000 mL | Freq: Once | INTRAVENOUS | Status: AC | PRN
  Administered 2023-04-12: 75 mL via INTRAVENOUS

## 2023-04-12 MED ORDER — BENZOCAINE 20 % MT AERO
INHALATION_SPRAY | Freq: Once | OROMUCOSAL | Status: AC
  Filled 2023-04-12: qty 57

## 2023-04-12 MED ORDER — DIAZEPAM 2 MG PO TABS
2.0000 mg | ORAL_TABLET | Freq: Once | ORAL | Status: AC
Start: 2023-04-12 — End: 2023-04-12
  Administered 2023-04-12: 2 mg via ORAL
  Filled 2023-04-12: qty 1

## 2023-04-12 NOTE — Discharge Instructions (Addendum)
 You were seen in the ED today with concern for peritonsillar abscess.  We attempted to drain.  We have a CT scan that shows it is in fact a peritonsillar abscess.    We have given you a dose of steroids while in the ED.  Please continue to take your antibiotic as prescribed.  Please call the ENT physician listed above in order to schedule a follow-up appointment in clinic.  If you have trouble swallowing or breathing, please return to the ED for evaluation and management.

## 2023-04-12 NOTE — ED Notes (Signed)
 Patient transported to CT

## 2023-04-12 NOTE — ED Triage Notes (Signed)
 Pt here from home with c/o chronic tonsil abscess , is on 3 day of antibiotics , has had to have  tonsillar abscess drained several times in the past

## 2023-04-12 NOTE — ED Provider Notes (Signed)
 [place] EMERGENCY DEPARTMENT AT [facility] Provider Note   CSN: [id number] Arrival date & time: 04/12/23  1710     History  Chief Complaint  Patient presents with   Sore Throat    Linda Dalton is a 48 y.o. 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  Patient was seen at urgent care on Thursday with concern for sore throat.  She was diagnosed with a PTA and prescribed clindamycin.  She took clindamycin on Friday morning, Saturday morning, and this morning with continued enlargement of PTA.  This morning, she felt she was not tolerating her secretions and could not lie flat comfortably.  Sore Throat     Home Medications Prior to Admission medications   Medication Sig Start Date End Date Taking? Authorizing Provider  acetaminophen (TYLENOL) 500 MG tablet Take 500-1,000 mg by mouth every 6 (six) hours as needed (for pain or headaches).    [provider]  albuterol (PROVENTIL) (2.5 MG/3ML) 0.083% nebulizer solution Take 3 mLs (2.5 mg total) by nebulization every 6 (six) hours as needed for wheezing or shortness of breath. 01/15/18   Loletta Specter, PA-C  albuterol (VENTOLIN HFA) 108 (90 Base) MCG/ACT inhaler Inhale 2 puffs into the lungs every 4 (four) hours as needed for Wheezing or Shortness of Breath. 08/14/20     budesonide-formoterol (SYMBICORT) 160-4.5 MCG/ACT inhaler Inhale 2 PUFFS into the lungs 2 (two) times daily. 12/31/20     cholecalciferol (VITAMIN D) 25 MCG (1000 UNIT) tablet Take 1,000 Units by mouth daily. Patient not taking: Reported on 11/21/2022    [provider]  Dupilumab 300 MG/2ML SOSY Inject 300 mg into the skin every 14 (fourteen) days. 03/01/18   [provider]  fexofenadine (ALLEGRA) 180 MG tablet Take 180 mg  by mouth daily as needed for allergies.    [provider]  meclizine (ANTIVERT) 25 MG tablet Take 1 tablet (25 mg total) by mouth 3 (three) times daily as needed for dizziness. 11/22/22   Horton, Mayer Masker, MD  montelukast (SINGULAIR) 10 MG tablet Take 1 tablet (10 mg total) by mouth nightly. 08/14/20     Multiple Vitamins-Calcium (ONE-A-DAY WOMENS PO) Take 1 tablet by mouth daily.    [provider]  Spacer/Aero-Holding Chambers (AEROCHAMBER PLUS FLO-VU MEDIUM) MISC use as direected 08/14/20     Spacer/Aero-Holding Chambers (EASIVENT) inhaler Use as directed with all inhalers. 06/05/20     vitamin B-12 (CYANOCOBALAMIN) 1000 MCG tablet Take 1,000 mcg by mouth daily. Patient not taking: Reported on 11/21/2022    [provider]      Allergies    Aspirin, Clarithromycin, Codeine, Ibuprofen, Naproxen, Nsaids, and Penicillins    Review of Systems   Review of Systems  Physical Exam Updated Vital Signs BP 123/85 (BP Location: Right Arm)   Pulse 79   Temp 99.3 F (37.4 C) (Oral)   Resp 18   LMP 03/15/2012   SpO2 100%  Physical Exam Vitals and nursing note reviewed.  Constitutional:      General: She is not in acute distress. HENT:     Mouth/Throat:     Comments: Right peritonsillar abscess noted.  Asymmetry of soft palate with uvular deviation to the left.  Voice is muffled.  Right tonsillar pillar is not visualized Cardiovascular:  Rate and Rhythm: Normal rate and regular rhythm.  Pulmonary:     Effort: Pulmonary effort is normal.  Musculoskeletal:     Cervical back: Normal range of motion.  Neurological:     Mental Status: She is alert.  Psychiatric:        Behavior: Behavior is cooperative.    ED Results / Procedures / Treatments   Labs (all labs ordered are listed, but only abnormal results are displayed) Labs Reviewed  COMPREHENSIVE METABOLIC PANEL WITH GFR - Abnormal; Notable for the following components:      Result Value   Total Protein 8.2  (*)    All other components within normal limits  CBC  PREGNANCY, URINE    EKG None  Radiology CT Soft Tissue Neck W Contrast Result Date: 04/12/2023 CLINICAL DATA:  Epiglottitis or tonsillitis suspected PTA EXAM: CT NECK WITH CONTRAST TECHNIQUE: Multidetector CT imaging of the neck was performed using the standard protocol following the bolus administration of intravenous contrast. RADIATION DOSE REDUCTION: This exam was performed according to the departmental dose-optimization program which includes automated exposure control, adjustment of the mA and/or kV according to patient size and/or use of iterative reconstruction technique. CONTRAST:  75mL OMNIPAQUE IOHEXOL 350 MG/ML SOLN COMPARISON:  CT Of the neck November 26, 2020. FINDINGS: Pharynx and larynx: Enlarged edematous tonsils, compatible with tonsillitis. Approximately 1.6 x 1.1 x 2.1 cm peripherally enhancing fluid collection in the right tonsil, compatible with abscess. Edema extends posteriorly and inferiorly to involve the hypopharynx and prevertebral soft tissues. Narrowing of the airway and effacement of the right piriform sinus. Closed glottis, limiting assessment of the glottic larynx. Salivary glands: No inflammation, mass, or stone. Thyroid: Normal. Lymph nodes: Prominent upper cervical chain lymph nodes bilaterally, nonspecific but likely reactive given the above findings. Vascular: Limited evaluation due to non arterial timing. Limited intracranial: Negative. Visualized orbits: Negative. Mastoids and visualized paranasal sinuses: Clear. Skeleton: No acute abnormality on limited assessment. Upper chest: Visualized lung apices are clear. IMPRESSION: Findings compatible with tonsillitis and 2.1 cm right tonsillar abscess. Edema extends posteriorly and inferiorly to involve the hypopharynx and prevertebral soft tissues. Electronically Signed   By: Feliberto Harts M.D.   On: 04/12/2023 22:32    Procedures .Incision and  Drainage  Date/Time: 04/12/2023 11:30 PM  Performed by: Renella Cunas, MD Authorized by: Melene Plan, DO   Consent:    Consent obtained:  Verbal   Consent given by:  Patient   Risks, benefits, and alternatives were discussed: yes     Risks discussed:  Bleeding, incomplete drainage, pain, infection and damage to other organs   Alternatives discussed:  No treatment and observation Universal protocol:    Procedure explained and questions answered to patient or proxy's satisfaction: yes     Relevant documents present and verified: yes     Required blood products, implants, devices, and special equipment available: yes     Site/side marked: yes     Immediately prior to procedure, a time out was called: yes     Patient identity confirmed:  Verbally with patient, arm band and hospital-assigned identification number Location:    Type:  Abscess   Size:  2cm   Location:  Mouth   Mouth location:  Peritonsillar Sedation:    Sedation type:  Anxiolysis Anesthesia:    Anesthesia method:  Topical application   Topical anesthesia: Hurricaine Spray. Procedure type:    Complexity:  Simple Procedure details:    Incision types:  Stab incision (x4)  Drainage:  Bloody   Drainage amount:  Scant   Wound treatment:  Wound left open Post-procedure details:    Procedure completion:  Tolerated well, no immediate complications   Medications Ordered in ED Medications  Benzocaine (HURRCAINE) 20 % mouth spray ( Mouth/Throat Given 04/12/23 1952)  diazepam (VALIUM) tablet 2 mg (2 mg Oral Given 04/12/23 1952)  oxyCODONE (Oxy IR/ROXICODONE) immediate release tablet 5 mg (5 mg Oral Given 04/12/23 1952)  dexamethasone (DECADRON) injection 10 mg (10 mg Intravenous Given 04/12/23 2122)  iohexol (OMNIPAQUE) 350 MG/ML injection 75 mL (75 mLs Intravenous Contrast Given 04/12/23 2215)    ED Course/ Medical Decision Making/ A&P                               Medical Decision Making Amount and/or Complexity of Data  Reviewed Labs: ordered. Radiology: ordered.  Risk Prescription drug management.   48 y.o. 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  Differential diagnosis includes peritonsillar abscess, retropharyngeal abscess, lymphoma, parotitis.  Physical exam with evidence of right PTA.  Attempted to drain right PTA as per procedure note, however only bloody drainage was visualized.  CT soft tissue neck to confirm PTA location.  Administered 10 mg of IV Decadron in the interim.  Lab work reassuring with no evidence of leukocytosis, stable hemoglobin, no electrolyte and metabolic derangements, appropriate renal and liver function.  CT neck notable for tonsillitis and 2.1 cm right tonsillar abscess with edema that extends posteriorly and inferiorly.  Patient with significant improvement in symptoms of swallowing and by the time CT read was back. Pain well controlled following procedure and patient endorses improvement in pain and swallowing as well as in her voice.  Patient is felt to be stable for discharge with continuation of clindamycin and outpatient ENT follow-up.  Phone numbers provided to patient to call in the a.m. to schedule outpatient ENT appointment.  Patient was discharged in hemodynamically stable condition.  Final Clinical Impression(s) / ED Diagnoses Final diagnoses:  Peritonsillar abscess    Rx / DC Orders ED Discharge Orders     None      Renella Cunas, PGY-2 Emergency Medicine   Renella Cunas, MD 04/12/23 2343    Melene Plan, DO 04/13/23 [phone]

## 2023-05-04 NOTE — H&P (Signed)
 HPI:   Linda Dalton is a 48 y.o. female who presents as a return Patient.   Current problem: Tonsil infection.  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 Symptomatic now for the past 5 or 6 days. On clindamycin . CT was done the other day which I have reviewed.  PMH/Meds/All/SocHx/FamHx/ROS:   Past Medical History:  Diagnosis Date  Acid reflux  Asthma  H/O blood clots   Past Surgical History:  Procedure Laterality Date  ANKLE SURGERY 2016  Procedure: ANKLE SURGERY  BREAST BIOPSY Left  Procedure: BREAST BIOPSY; B9  ILIAC ARTERY STENT  Procedure: ILIAC ARTERY STENT  NASAL POLYP SURGERY  Procedure: NASAL POLYP SURGERY  WISDOM TOOTH EXTRACTION  Procedure: WISDOM TOOTH EXTRACTION   No family history of bleeding disorders, wound healing problems or difficulty with anesthesia.     Current Outpatient Medications:  albuterol  2.5 mg /3 mL (0.083 %) nebulizer solution, Take 2.5 mg by nebulization every 6 (six) hours as needed for wheezing., Disp: 60 mL, Rfl: 2 clindamycin  (CLEOCIN ) 300 mg capsule, Take 1 capsule (300 mg total) by mouth 3 (three) times a day for 14 days., Disp: 42 capsule, Rfl: 0 cyanocobalamin (VITAMIN B12) 1,000 mcg tablet, Take 1,000 mcg by mouth daily., Disp: , Rfl:  diphenhydramine-lidocaine -maalox (FIRST-MOUTHWASH BLM) oral suspension, Take 10 mL by mouth 4 (four) times a day for 7 days., Disp: 280 mL, Rfl: 0 dupilumab (DUPIXENT) 300 mg/2 mL pnij, INJECT THE CONTENTS OF 1 PEN INTO THE SKIN ONCE EVERY OTHER WEEK, Disp: 4 mL, Rfl: 22 ergocalciferol (Vitamin D2) 1,250 mcg (50,000 unit) capsule, Take 1 capsule (50,000 Units total) by mouth once a week., Disp: 4 capsule, Rfl: 0 escitalopram (LEXAPRO) 5 mg tablet, , Disp: , Rfl:  fexofenadine (ALLEGRA) 60 mg tablet, Take by mouth., Disp: , Rfl:  fluticasone  propionate (FLONASE ) 50 mcg/spray nasal spray, 2 sprays Once Daily., Disp: 1 each, Rfl: 5 hydrOXYzine   (ATARAX ) 10 mg tablet, TAKE 1 TO 2 TABLETS BY MOUTH AS NEEDED FOR SLEEP, Disp: , Rfl:  montelukast  (SINGULAIR ) 10 mg tablet, Take 1 tablet by mouth nightly, Disp: 90 tablet, Rfl: 3 multivitamin cap, Take 1 capsule by mouth Once Daily., Disp: , Rfl:  Symbicort  160-4.5 mcg/actuation inhaler, Inhale 2 puffs by mouth twice daily, Disp: 11 g, Rfl: 2   Physical Exam:   Healthy appearing in no distress but obviously with discomfort. There is no trismus. Fullness of the right tonsil in particular. No soft palate fullness but there is uvular edema. There is no palpable adenopathy.  Independent Review of Additional Tests or Records:  CT scan reviewed. Consistent with intratonsillar abscess.  Procedures:  none  Impression & Plans:  Acute tonsillitis with abscess within the tonsil. This does not require surgical drainage. With her history I would recommend once she is recovered that we schedule her for tonsillectomy so this does not continue to happen. Follow-up next week or sooner if she is getting any worse. The antibiotics.

## 2023-05-11 ENCOUNTER — Ambulatory Visit (HOSPITAL_BASED_OUTPATIENT_CLINIC_OR_DEPARTMENT_OTHER): Admit: 2023-05-11 | Admitting: Otolaryngology

## 2023-05-11 ENCOUNTER — Encounter (HOSPITAL_BASED_OUTPATIENT_CLINIC_OR_DEPARTMENT_OTHER): Payer: Self-pay

## 2023-05-11 SURGERY — TONSILLECTOMY
Anesthesia: General | Laterality: Bilateral

## 2023-05-13 ENCOUNTER — Other Ambulatory Visit: Payer: Self-pay
